# Patient Record
Sex: Male | Born: 1935 | Race: White | Hispanic: No | Marital: Married | Smoking: Former smoker
Health system: Southern US, Community
[De-identification: ages and names within clinical notes are randomized; demographics above are authoritative.]

## PROBLEM LIST (undated history)

## (undated) DIAGNOSIS — R55 Syncope and collapse: Secondary | ICD-10-CM

## (undated) DIAGNOSIS — I219 Acute myocardial infarction, unspecified: Secondary | ICD-10-CM

## (undated) DIAGNOSIS — F0390 Unspecified dementia without behavioral disturbance: Secondary | ICD-10-CM

## (undated) DIAGNOSIS — E785 Hyperlipidemia, unspecified: Secondary | ICD-10-CM

## (undated) DIAGNOSIS — I719 Aortic aneurysm of unspecified site, without rupture: Secondary | ICD-10-CM

## (undated) DIAGNOSIS — R778 Other specified abnormalities of plasma proteins: Secondary | ICD-10-CM

## (undated) DIAGNOSIS — C801 Malignant (primary) neoplasm, unspecified: Secondary | ICD-10-CM

## (undated) DIAGNOSIS — N189 Chronic kidney disease, unspecified: Secondary | ICD-10-CM

## (undated) DIAGNOSIS — I2510 Atherosclerotic heart disease of native coronary artery without angina pectoris: Secondary | ICD-10-CM

## (undated) DIAGNOSIS — R7989 Other specified abnormal findings of blood chemistry: Secondary | ICD-10-CM

## (undated) DIAGNOSIS — E119 Type 2 diabetes mellitus without complications: Secondary | ICD-10-CM

## (undated) HISTORY — DX: Atherosclerotic heart disease of native coronary artery without angina pectoris: I25.10

## (undated) HISTORY — DX: Chronic kidney disease, unspecified: N18.9

## (undated) HISTORY — PX: ABDOMINAL AORTIC ANEURYSM REPAIR: SUR1152

## (undated) HISTORY — DX: Type 2 diabetes mellitus without complications: E11.9

## (undated) HISTORY — DX: Aortic aneurysm of unspecified site, without rupture: I71.9

## (undated) HISTORY — DX: Other specified abnormal findings of blood chemistry: R79.89

## (undated) HISTORY — DX: Hyperlipidemia, unspecified: E78.5

## (undated) HISTORY — PX: WISDOM TOOTH EXTRACTION: SHX21

## (undated) HISTORY — DX: Unspecified dementia, unspecified severity, without behavioral disturbance, psychotic disturbance, mood disturbance, and anxiety: F03.90

## (undated) HISTORY — DX: Syncope and collapse: R55

## (undated) HISTORY — PX: HERNIA REPAIR: SHX51

## (undated) HISTORY — DX: Other specified abnormalities of plasma proteins: R77.8

## (undated) HISTORY — PX: APPENDECTOMY: SHX54

## (undated) HISTORY — PX: CATARACT EXTRACTION W/ INTRAOCULAR LENS IMPLANT: SHX1309

---

## 2019-06-25 ENCOUNTER — Encounter: Payer: Self-pay | Admitting: Medical

## 2019-09-03 ENCOUNTER — Encounter: Payer: Self-pay | Admitting: *Deleted

## 2019-09-03 ENCOUNTER — Ambulatory Visit (INDEPENDENT_AMBULATORY_CARE_PROVIDER_SITE_OTHER): Payer: Medicare Other | Admitting: Cardiology

## 2019-09-03 ENCOUNTER — Other Ambulatory Visit: Payer: Self-pay

## 2019-09-03 ENCOUNTER — Encounter: Payer: Self-pay | Admitting: Cardiology

## 2019-09-03 VITALS — BP 120/56 | HR 66 | Ht 71.5 in | Wt 196.0 lb

## 2019-09-03 DIAGNOSIS — I2510 Atherosclerotic heart disease of native coronary artery without angina pectoris: Secondary | ICD-10-CM | POA: Insufficient documentation

## 2019-09-03 DIAGNOSIS — E785 Hyperlipidemia, unspecified: Secondary | ICD-10-CM | POA: Diagnosis not present

## 2019-09-03 DIAGNOSIS — I10 Essential (primary) hypertension: Secondary | ICD-10-CM

## 2019-09-03 DIAGNOSIS — I714 Abdominal aortic aneurysm, without rupture, unspecified: Secondary | ICD-10-CM

## 2019-09-03 HISTORY — DX: Atherosclerotic heart disease of native coronary artery without angina pectoris: I25.10

## 2019-09-03 HISTORY — DX: Essential (primary) hypertension: I10

## 2019-09-03 HISTORY — DX: Abdominal aortic aneurysm, without rupture, unspecified: I71.40

## 2019-09-03 MED ORDER — CLOPIDOGREL BISULFATE 75 MG PO TABS: 75.0000 mg | ORAL_TABLET | Freq: Every day | ORAL | 1 refills | Status: DC

## 2019-09-03 MED ORDER — METOPROLOL SUCCINATE ER 25 MG PO TB24: 25.0000 mg | ORAL_TABLET | Freq: Every day | ORAL | 1 refills | Status: DC

## 2019-09-03 MED FILL — METOPROLOL SUCCINATE ER 25: 25 | 90 days supply | Qty: 90 | Fill #0

## 2019-09-03 MED FILL — CLOPIDOGREL 75 MG TABLET: 75 | 90 days supply | Qty: 90 | Fill #0

## 2019-09-03 NOTE — Patient Instructions (Signed)
Medication Instructions:  Your physician recommends that you continue on your current medications as directed. Please refer to the Current Medication list given to you today.  *If you need a refill on your cardiac medications before your next appointment, please call your pharmacy*  Lab Work: NOne If you have labs (blood work) drawn today and your tests are completely normal, you will receive your results only by: Marland Kitchen MyChart Message (if you have MyChart) OR . A paper copy in the mail If you have any lab test that is abnormal or we need to change your treatment, we will call you to review the results.  Testing/Procedures: Your physician has requested that you have an echocardiogram. Echocardiography is a painless test that uses sound waves to create images of your heart. It provides your doctor with information about the size and shape of your heart and how well your heart's chambers and valves are working. This procedure takes approximately one hour. There are no restrictions for this procedure.    Follow-Up: At [facility], you and your health needs are our priority.  As part of our continuing mission to provide you with exceptional heart care, we have created designated Provider Care Teams.  These Care Teams include your primary Cardiologist (physician) and Advanced Practice Providers (APPs -  Physician Assistants and Nurse Practitioners) who all work together to provide you with the care you need, when you need it.  Your next appointment:   3 months  The format for your next appointment:   In Person  Provider:   Berniece Salines, DO  Other Instructions

## 2019-09-03 NOTE — Progress Notes (Signed)
Cardiology Office Note:    Date:  09/03/2019   ID:  Ian Harrison, DOB 06/17/1927, MRN [id number]  PCP:  Patient, No Pcp Per  Cardiologist:  Berniece Salines, DO  Electrophysiologist:  None   Referring MD: No ref. provider found   The patient is here to establish care.  History of Present Illness:    Ian Harrison is a 85 y.o. male with a hx of CAD  With prior MI requiring RCA stent on 2005, NSTEMI in 02/2015 with another stent placement to his RCA, residual 50% Lcx disease, Aortic Anuerysm status post repair in 2007, Dyslipidemia and was taken off atorvastatin due to myalgias., Type II DM, CKD and Dementia.  The patient is here today to establish care. He offers no complaints at this time.   Past Medical History:  Diagnosis Date  . Aortic aneurysm ([place])   . CAD (coronary artery disease)   . Chronic kidney disease   . Dementia ([place])   . Diabetes mellitus type II, non insulin dependent ([place])   . Dyslipidemia    Intolerance to atorvastatin due to myalgias  . Vasovagal syncope    Without recurrence    Past Surgical History:  Procedure Laterality Date  . APPENDECTOMY    . HERNIA REPAIR      Current Medications: Current Meds  Medication Sig  . aspirin EC 81 MG tablet Take 81 mg by mouth daily.  . clopidogrel (PLAVIX) 75 MG tablet Take 1 tablet (75 mg total) by mouth daily.  Marland Kitchen donepezil (ARICEPT) 5 MG tablet Take 5 mg by mouth at bedtime.  . metoprolol succinate (TOPROL-XL) 25 MG 24 hr tablet Take 1 tablet (25 mg total) by mouth daily.  . [DISCONTINUED] clopidogrel (PLAVIX) 75 MG tablet Take 75 mg by mouth daily.  . [DISCONTINUED] metoprolol succinate (TOPROL-XL) 25 MG 24 hr tablet Take 25 mg by mouth daily.     Allergies:   Atorvastatin and Morphine and related   Social History   Socioeconomic History  . Marital status: Married    Spouse name: Not on file  . Number of children: Not on file  . Years of education: Not on file  . Highest education level: Not on file   Occupational History  . Not on file  Social Needs  . Financial resource strain: Not on file  . Food insecurity    Worry: Not on file    Inability: Not on file  . Transportation needs    Medical: Not on file    Non-medical: Not on file  Tobacco Use  . Smoking status: Former Research scientist (life sciences)  . Smokeless tobacco: Never Used  Substance and Sexual Activity  . Alcohol use: Not Currently  . Drug use: Not on file  . Sexual activity: Not on file  Lifestyle  . Physical activity    Days per week: Not on file    Minutes per session: Not on file  . Stress: Not on file  Relationships  . Social Herbalist on phone: Not on file    Gets together: Not on file    Attends religious service: Not on file    Active member of club or organization: Not on file    Attends meetings of clubs or organizations: Not on file    Relationship status: Not on file  Other Topics Concern  . Not on file  Social History Narrative  . Not on file     Family History: The patient's family history includes Heart attack  in his brother.  ROS:   Review of Systems  Constitution: Negative for decreased appetite, fever and weight gain.  HENT: Negative for congestion, ear discharge, hoarse voice and sore throat.   Eyes: Negative for discharge, redness, vision loss in right eye and visual halos.  Cardiovascular: Negative for chest pain, dyspnea on exertion, leg swelling, orthopnea and palpitations.  Respiratory: Negative for cough, hemoptysis, shortness of breath and snoring.   Endocrine: Negative for heat intolerance and polyphagia.  Hematologic/Lymphatic: Negative for bleeding problem. Does not bruise/bleed easily.  Skin: Negative for flushing, nail changes, rash and suspicious lesions.  Musculoskeletal: Negative for arthritis, joint pain, muscle cramps, myalgias, neck pain and stiffness.  Gastrointestinal: Negative for abdominal pain, bowel incontinence, diarrhea and excessive appetite.  Genitourinary: Negative  for decreased libido, genital sores and incomplete emptying.  Neurological: Negative for brief paralysis, focal weakness, headaches and loss of balance.  Psychiatric/Behavioral: Negative for altered mental status, depression and suicidal ideas.  Allergic/Immunologic: Negative for HIV exposure and persistent infections.    EKGs/Labs/Other Studies Reviewed:    The following studies were reviewed today:   EKG:  The ekg ordered today demonstrates sinus rhythm,   TTE 2013 shows EF 60%, Mild LVH at 1.2cm. No wall motion abnormalities. No significant valvular disease. Aortic and mitral valve sclerosis without stenosis.   Nuclear stress test 2012 previous cardiologist reported as normal.  Recent Labs: No results found for requested labs within last 8760 hours.  Recent Lipid Panel No results found for: CHOL, TRIG, HDL, CHOLHDL, VLDL, LDLCALC, LDLDIRECT  Physical Exam:    VS:  BP (!) 120/56 (BP Location: Right Arm, Patient Position: Sitting, Cuff Size: Normal)   Pulse 66   Ht 5' 11.5" (1.816 m)   Wt 196 lb (88.9 kg)   SpO2 96%   BMI 26.96 kg/m     Wt Readings from Last 3 Encounters:  09/03/19 196 lb (88.9 kg)     GEN: Well nourished, well developed in no acute distress HEENT: Normal NECK: No JVD; No carotid bruits LYMPHATICS: No lymphadenopathy CARDIAC: S1S2 noted,RRR, no murmurs, rubs, gallops RESPIRATORY:  Clear to auscultation without rales, wheezing or rhonchi  ABDOMEN: Soft, non-tender, non-distended, +bowel sounds, no guarding. EXTREMITIES: No edema, No cyanosis, no clubbing MUSCULOSKELETAL:  No edema; No deformity  SKIN: Warm and dry NEUROLOGIC:  Alert and oriented x 3, non-focal PSYCHIATRIC:  Normal affect, good insight  ASSESSMENT:    1. Coronary artery disease involving native coronary artery of native heart without angina pectoris   2. AAA (abdominal aortic aneurysm) without rupture ([place])   3. Dyslipidemia   4. Essential hypertension    PLAN:    1.The  patient  Has no complaints today. His goal for his visit today is to establish cardiac care. I was able to review past notes from his previous cardiologist. Today, I will order a TTE in part of his establishment of care, last TTE was in 2015. He have requested refills for Plavix 75mg  daily and Toprol XL 25 mg daily.   2. He recently have blood work with his pcp and the patient will bring this blood work to me.  3. No changes in his medication at this time.  The patient is in agreement with the above plan. The patient left the office in stable condition.  The patient will follow up in 3 months.    Medication Adjustments/Labs and Tests Ordered: Current medicines are reviewed at length with the patient today.  Concerns regarding medicines are outlined above.  Orders Placed This Encounter  Procedures  . EKG 12-Lead  . ECHOCARDIOGRAM COMPLETE   Meds ordered this encounter  Medications  . clopidogrel (PLAVIX) 75 MG tablet    Sig: Take 1 tablet (75 mg total) by mouth daily.    Dispense:  90 tablet    Refill:  1  . metoprolol succinate (TOPROL-XL) 25 MG 24 hr tablet    Sig: Take 1 tablet (25 mg total) by mouth daily.    Dispense:  90 tablet    Refill:  1    Patient Instructions  Medication Instructions:  Your physician recommends that you continue on your current medications as directed. Please refer to the Current Medication list given to you today.  *If you need a refill on your cardiac medications before your next appointment, please call your pharmacy*  Lab Work: NOne If you have labs (blood work) drawn today and your tests are completely normal, you will receive your results only by: Marland Kitchen MyChart Message (if you have MyChart) OR . A paper copy in the mail If you have any lab test that is abnormal or we need to change your treatment, we will call you to review the results.  Testing/Procedures: Your physician has requested that you have an echocardiogram. Echocardiography is a  painless test that uses sound waves to create images of your heart. It provides your doctor with information about the size and shape of your heart and how well your heart's chambers and valves are working. This procedure takes approximately one hour. There are no restrictions for this procedure.    Follow-Up: At [facility], you and your health needs are our priority.  As part of our continuing mission to provide you with exceptional heart care, we have created designated Provider Care Teams.  These Care Teams include your primary Cardiologist (physician) and Advanced Practice Providers (APPs -  Physician Assistants and Nurse Practitioners) who all work together to provide you with the care you need, when you need it.  Your next appointment:   3 months  The format for your next appointment:   In Person  Provider:   Berniece Salines, DO  Other Instructions      Adopting a Healthy Lifestyle.  Know what a healthy weight is for you (roughly BMI <25) and aim to maintain this   Aim for 7+ servings of fruits and vegetables daily   65-80+ fluid ounces of water or unsweet tea for healthy kidneys   Limit to max 1 drink of alcohol per day; avoid smoking/tobacco   Limit animal fats in diet for cholesterol and heart health - choose grass fed whenever available   Avoid highly processed foods, and foods high in saturated/trans fats   Aim for low stress - take time to unwind and care for your mental health   Aim for 150 min of moderate intensity exercise weekly for heart health, and weights twice weekly for bone health   Aim for 7-9 hours of sleep daily   When it comes to diets, agreement about the perfect plan isnt easy to find, even among the experts. Experts at the [employer] developed an idea known as the Healthy Eating Plate. Just imagine a plate divided into logical, healthy portions.   The emphasis is on diet quality:   Load up on vegetables and fruits -  one-half of your plate: Aim for color and variety, and remember that potatoes dont count.   Go for whole grains - one-quarter of your  plate: Whole wheat, barley, wheat berries, quinoa, oats, brown rice, and foods made with them. If you want pasta, go with whole wheat pasta.   Protein power - one-quarter of your plate: Fish, chicken, beans, and nuts are all healthy, versatile protein sources. Limit red meat.   The diet, however, does go beyond the plate, offering a few other suggestions.   Use healthy plant oils, such as olive, canola, soy, corn, sunflower and peanut. Check the labels, and avoid partially hydrogenated oil, which have unhealthy trans fats.   If youre thirsty, drink water. Coffee and tea are good in moderation, but skip sugary drinks and limit milk and dairy products to one or two daily servings.   The type of carbohydrate in the diet is more important than the amount. Some sources of carbohydrates, such as vegetables, fruits, whole grains, and beans-are healthier than others.   Finally, stay active  Signed, Berniece Salines, DO  09/03/2019 4:04 PM    [facility]

## 2019-09-06 ENCOUNTER — Other Ambulatory Visit: Payer: Self-pay

## 2019-09-06 ENCOUNTER — Ambulatory Visit (HOSPITAL_BASED_OUTPATIENT_CLINIC_OR_DEPARTMENT_OTHER)
Admission: RE | Admit: 2019-09-06 | Discharge: 2019-09-06 | Disposition: A | Discharge status: Home / Self Care | Payer: Medicare Other | Source: Ambulatory Visit | Attending: Cardiology | Admitting: Cardiology

## 2019-09-06 DIAGNOSIS — I10 Essential (primary) hypertension: Secondary | ICD-10-CM | POA: Diagnosis present

## 2019-09-06 DIAGNOSIS — I2510 Atherosclerotic heart disease of native coronary artery without angina pectoris: Secondary | ICD-10-CM | POA: Diagnosis present

## 2019-09-06 NOTE — Progress Notes (Signed)
  Echocardiogram 2D Echocardiogram has been performed.  Ian Harrison 09/06/2019, 2:57 PM

## 2019-09-24 ENCOUNTER — Encounter: Payer: Self-pay | Admitting: Medical

## 2019-09-24 ENCOUNTER — Ambulatory Visit (INDEPENDENT_AMBULATORY_CARE_PROVIDER_SITE_OTHER): Payer: Medicare Other | Admitting: Medical

## 2019-09-24 ENCOUNTER — Other Ambulatory Visit: Payer: Self-pay

## 2019-09-24 VITALS — BP 131/61 | HR 63 | Temp 96.5°F | Resp 16 | Ht 71.2 in | Wt 193.2 lb

## 2019-09-24 DIAGNOSIS — E785 Hyperlipidemia, unspecified: Secondary | ICD-10-CM | POA: Diagnosis not present

## 2019-09-24 DIAGNOSIS — I2510 Atherosclerotic heart disease of native coronary artery without angina pectoris: Secondary | ICD-10-CM | POA: Diagnosis not present

## 2019-09-24 DIAGNOSIS — I714 Abdominal aortic aneurysm, without rupture, unspecified: Secondary | ICD-10-CM

## 2019-09-24 DIAGNOSIS — I10 Essential (primary) hypertension: Secondary | ICD-10-CM | POA: Diagnosis not present

## 2019-09-24 DIAGNOSIS — F0390 Unspecified dementia without behavioral disturbance: Secondary | ICD-10-CM

## 2019-09-24 HISTORY — DX: Unspecified dementia, unspecified severity, without behavioral disturbance, psychotic disturbance, mood disturbance, and anxiety: F03.90

## 2019-09-24 MED ORDER — DONEPEZIL HCL 5 MG PO TABS: 5.0000 mg | ORAL_TABLET | Freq: Every day | ORAL | 3 refills | Status: DC

## 2019-09-24 MED FILL — DONEPEZIL HCL 5 MG TABLET: 5 | 90 days supply | Qty: 90 | Fill #0

## 2019-09-24 NOTE — Patient Instructions (Addendum)
For cognitive decline/dementia, continue aricept. If you want me to refer to neurologist in the future then let me know.  For CAD, continue aspirin and plavix. Might consider zetia in future if needed but at this point with age and hx probably not necessary  For htn continue with metoprolol.   For elevated sugar borderline will get a1c in 3 months or as needed  Follow up 3 months fasting labs/check up or as needed

## 2019-09-24 NOTE — Progress Notes (Signed)
Subjective:    Patient ID: Ian Harrison, male    DOB: August 04, 1927, 85 y.o.   MRN: [id number]  HPI  Moved from [place] with wife. Family in area/  Benign htn hx. Bp controlled today.  Hx of bph.   Dementia history. Has been on maybe aricept for  1.5 years.   Type II diabetes hx.A1c 8-31/2020 6.5.  Hyperlipidemia.(hx of stents) 06/25/2019 lipid panel looked good.     Review of Systems  Constitutional: Negative for chills, fatigue and fever.  HENT: Negative for congestion and ear pain.   Respiratory: Negative for cough, chest tightness, shortness of breath and wheezing.   Cardiovascular: Negative for chest pain and palpitations.  Gastrointestinal: Negative for abdominal pain.  Musculoskeletal: Negative for back pain.  Skin: Negative for rash.  Neurological: Negative for dizziness, seizures, syncope, weakness and light-headedness.  Hematological: Negative for adenopathy. Does not bruise/bleed easily.  Psychiatric/Behavioral: Negative for behavioral problems, confusion, sleep disturbance and suicidal ideas. The patient is not nervous/anxious.      Past Medical History:  Diagnosis Date  . Aortic aneurysm ([place])   . CAD (coronary artery disease)   . Chronic kidney disease   . Dementia ([place])   . Diabetes mellitus type II, non insulin dependent ([place])   . Dyslipidemia    Intolerance to atorvastatin due to myalgias  . Vasovagal syncope    Without recurrence     Social History   Socioeconomic History  . Marital status: Married    Spouse name: Not on file  . Number of children: Not on file  . Years of education: Not on file  . Highest education level: Not on file  Occupational History  . Not on file  Social Needs  . Financial resource strain: Not on file  . Food insecurity    Worry: Not on file    Inability: Not on file  . Transportation needs    Medical: Not on file    Non-medical: Not on file  Tobacco Use  . Smoking status: Former Research scientist (life sciences)  . Smokeless  tobacco: Never Used  Substance and Sexual Activity  . Alcohol use: Not Currently  . Drug use: Not on file  . Sexual activity: Not on file  Lifestyle  . Physical activity    Days per week: Not on file    Minutes per session: Not on file  . Stress: Not on file  Relationships  . Social Herbalist on phone: Not on file    Gets together: Not on file    Attends religious service: Not on file    Active member of club or organization: Not on file    Attends meetings of clubs or organizations: Not on file    Relationship status: Not on file  . Intimate partner violence    Fear of current or ex partner: Not on file    Emotionally abused: Not on file    Physically abused: Not on file    Forced sexual activity: Not on file  Other Topics Concern  . Not on file  Social History Narrative  . Not on file    Past Surgical History:  Procedure Laterality Date  . APPENDECTOMY    . HERNIA REPAIR      Family History  Problem Relation Age of Onset  . Heart attack Brother     Allergies  Allergen Reactions  . Atorvastatin Other (See Comments)    myalgia  . Morphine And Related     Current  Outpatient Medications on File Prior to Visit  Medication Sig Dispense Refill  . aspirin EC 81 MG tablet Take 81 mg by mouth daily.    . clopidogrel (PLAVIX) 75 MG tablet Take 1 tablet (75 mg total) by mouth daily. 90 tablet 1  . metoprolol succinate (TOPROL-XL) 25 MG 24 hr tablet Take 1 tablet (25 mg total) by mouth daily. 90 tablet 1   No current facility-administered medications on file prior to visit.     BP 131/61   Pulse 63   Temp (!) 96.5 F (35.8 C) (Temporal)   Resp 16   Ht 5' 11.2" (1.808 m)   Wt 193 lb 3.2 oz (87.6 kg)   SpO2 99%   BMI 26.79 kg/m      Objective:   Physical Exam  General Mental Status- Alert. General Appearance- Not in acute distress.  Pleasant and very friendly. He directs wife to answer a lot of questions I ask him.  Skin General: Color- Normal  Color. Moisture- Normal Moisture.  Neck Carotid Arteries- Normal color. Moisture- Normal Moisture. No carotid bruits. No JVD.  Chest and Lung Exam Auscultation: Breath Sounds:-Normal.  Cardiovascular Auscultation:Rythm- Regular. Murmurs & Other Heart Sounds:Auscultation of the heart reveals- No Murmurs.  Abdomen Inspection:-Inspeection Normal. Palpation/Percussion:Note:No mass. Palpation and Percussion of the abdomen reveal- Non Tender, Non Distended + BS, no rebound or guarding.    Neurologic Cranial Nerve exam:- CN III-XII intact(No nystagmus), symmetric smile. Strength:- 5/5 equal and symmetric strength both upper and lower extremities.      Assessment & Plan:  For cognitive decline/dementia, continue aricept. If you want me to refer to neurologist in the future then let me know.  For CAD, continue aspirin and plavix. Might consider zetia in future if needed but at this point with age and hx probably not necessary  For htn continue with metoprolol.   For elevated sugar borderline will get a1c in 3 months or as needed  [employer], [employer]

## 2019-11-26 ENCOUNTER — Ambulatory Visit (INDEPENDENT_AMBULATORY_CARE_PROVIDER_SITE_OTHER): Payer: Medicare Other | Admitting: Cardiology

## 2019-11-26 ENCOUNTER — Other Ambulatory Visit: Payer: Self-pay

## 2019-11-26 ENCOUNTER — Encounter: Payer: Self-pay | Admitting: Cardiology

## 2019-11-26 VITALS — BP 130/64 | HR 62 | Ht 71.2 in | Wt 188.0 lb

## 2019-11-26 DIAGNOSIS — I10 Essential (primary) hypertension: Secondary | ICD-10-CM | POA: Diagnosis not present

## 2019-11-26 DIAGNOSIS — I2510 Atherosclerotic heart disease of native coronary artery without angina pectoris: Secondary | ICD-10-CM | POA: Diagnosis not present

## 2019-11-26 DIAGNOSIS — E785 Hyperlipidemia, unspecified: Secondary | ICD-10-CM

## 2019-11-26 NOTE — Progress Notes (Signed)
Cardiology Office Note:    Date:  11/26/2019   ID:  Ian Harrison, DOB 12-09-1926, MRN [id number]  PCP:  Ian Pai, PA-C  Cardiologist:  Ian Salines, DO  Electrophysiologist:  None   Referring MD: No ref. provider found   Chief Complaint  Patient presents with  . Follow-up   History of Present Illness:    Ian Harrison is a 86 y.o. male with a hx of CAD  With prior MI requiring RCA stent on 2005, NSTEMI in 02/2015 with another stent placement to his RCA, residual 50% Lcx disease, Aortic Anuerysm status post repair in 2007, Dyslipidemia and was taken off atorvastatin due to myalgias., Type II DM, CKD and Dementia.  I did see the patient on September 03, 2019 he was here to establish cardiac care.  At that time I was able to review the previous notes from his prior cardiologist.  And order echocardiogram to assess LV function in part of stopping his cardiac care.  No medication changes were made at this time.  He is here for follow-up visit he offers no complaints at this time.   Past Medical History:  Diagnosis Date  . Aortic aneurysm ([place])   . CAD (coronary artery disease)   . Chronic kidney disease   . Dementia ([place])   . Diabetes mellitus type II, non insulin dependent ([place])   . Dyslipidemia    Intolerance to atorvastatin due to myalgias  . Vasovagal syncope    Without recurrence    Past Surgical History:  Procedure Laterality Date  . APPENDECTOMY    . HERNIA REPAIR      Current Medications: Current Meds  Medication Sig  . aspirin EC 81 MG tablet Take 81 mg by mouth daily.  . clopidogrel (PLAVIX) 75 MG tablet Take 1 tablet (75 mg total) by mouth daily.  Marland Kitchen donepezil (ARICEPT) 5 MG tablet Take 1 tablet (5 mg total) by mouth at bedtime.  . metoprolol succinate (TOPROL-XL) 25 MG 24 hr tablet Take 1 tablet (25 mg total) by mouth daily.     Allergies:   Atorvastatin and Morphine and related   Social History   Socioeconomic History  . Marital status: Married     Spouse name: Not on file  . Number of children: Not on file  . Years of education: Not on file  . Highest education level: Not on file  Occupational History  . Not on file  Tobacco Use  . Smoking status: Former Research scientist (life sciences)  . Smokeless tobacco: Never Used  Substance and Sexual Activity  . Alcohol use: Not Currently  . Drug use: Not on file  . Sexual activity: Not on file  Other Topics Concern  . Not on file  Social History Narrative  . Not on file   Social Determinants of Health   Financial Resource Strain:   . Difficulty of Paying Living Expenses: Not on file  Food Insecurity:   . Worried About Charity fundraiser in the Last Year: Not on file  . Ran Out of Food in the Last Year: Not on file  Transportation Needs:   . Lack of Transportation (Medical): Not on file  . Lack of Transportation (Non-Medical): Not on file  Physical Activity:   . Days of Exercise per Week: Not on file  . Minutes of Exercise per Session: Not on file  Stress:   . Feeling of Stress : Not on file  Social Connections:   . Frequency of Communication with Friends and Family:  Not on file  . Frequency of Social Gatherings with Friends and Family: Not on file  . Attends Religious Services: Not on file  . Active Member of Clubs or Organizations: Not on file  . Attends Archivist Meetings: Not on file  . Marital Status: Not on file     Family History: The patient's family history includes Heart attack in his brother.  ROS:   Review of Systems  Constitution: Negative for decreased appetite, fever and weight gain.  HENT: Negative for congestion, ear discharge, hoarse voice and sore throat.   Eyes: Negative for discharge, redness, vision loss in right eye and visual halos.  Cardiovascular: Negative for chest pain, dyspnea on exertion, leg swelling, orthopnea and palpitations.  Respiratory: Negative for cough, hemoptysis, shortness of breath and snoring.   Endocrine: Negative for heat  intolerance and polyphagia.  Hematologic/Lymphatic: Negative for bleeding problem. Does not bruise/bleed easily.  Skin: Negative for flushing, nail changes, rash and suspicious lesions.  Musculoskeletal: Negative for arthritis, joint pain, muscle cramps, myalgias, neck pain and stiffness.  Gastrointestinal: Negative for abdominal pain, bowel incontinence, diarrhea and excessive appetite.  Genitourinary: Negative for decreased libido, genital sores and incomplete emptying.  Neurological: Negative for brief paralysis, focal weakness, headaches and loss of balance.  Psychiatric/Behavioral: Negative for altered mental status, depression and suicidal ideas.  Allergic/Immunologic: Negative for HIV exposure and persistent infections.    EKGs/Labs/Other Studies Reviewed:    The following studies were reviewed today:   EKG: None today  Transthoracic echocardiogram IMPRESSIONS 09/06/2019 1. Left ventricular ejection fraction, by visual estimation, is 60 to  65%. The left ventricle has normal function. Left ventricular septal wall  thickness was mildly increased. Mildly increased left ventricular  posterior wall thickness. There is mildly  increased left ventricular hypertrophy.  2. Left ventricular diastolic parameters are consistent with Grade I  diastolic dysfunction (impaired relaxation).  3. Global right ventricle has normal systolic function.The right  ventricular size is normal. No increase in right ventricular wall  thickness.  4. Left atrial size was normal.  5. Right atrial size was normal.  6. The mitral valve is normal in structure. No evidence of mitral valve  regurgitation. No evidence of mitral stenosis.  7. The tricuspid valve is normal in structure. Tricuspid valve  regurgitation is not demonstrated.  8. The aortic valve is normal in structure. Aortic valve regurgitation is  trivial. Mild aortic valve sclerosis without stenosis.  9. The pulmonic valve was normal in  structure. Pulmonic valve  regurgitation is not visualized.  10. Normal pulmonary artery systolic pressure.   Recent Labs: No results found for requested labs within last 8760 hours.  Recent Lipid Panel No results found for: CHOL, TRIG, HDL, CHOLHDL, VLDL, LDLCALC, LDLDIRECT  Physical Exam:    VS:  BP 130/64 (BP Location: Right Arm, Patient Position: Sitting, Cuff Size: Normal)   Pulse 62   Ht 5' 11.2" (1.808 m)   Wt 188 lb (85.3 kg)   SpO2 99%   BMI 26.07 kg/m     Wt Readings from Last 3 Encounters:  11/26/19 188 lb (85.3 kg)  09/24/19 193 lb 3.2 oz (87.6 kg)  09/03/19 196 lb (88.9 kg)     GEN: Well nourished, well developed in no acute distress HEENT: Normal NECK: No JVD; No carotid bruits LYMPHATICS: No lymphadenopathy CARDIAC: S1S2 noted,RRR, no murmurs, rubs, gallops RESPIRATORY:  Clear to auscultation without rales, wheezing or rhonchi  ABDOMEN: Soft, non-tender, non-distended, +bowel sounds, no guarding. EXTREMITIES: No  edema, No cyanosis, no clubbing MUSCULOSKELETAL:  No deformity  SKIN: Warm and dry NEUROLOGIC:  Alert and oriented x 3, non-focal PSYCHIATRIC:  Normal affect, good insight  ASSESSMENT:    1. Coronary artery disease involving native coronary artery of native heart without angina pectoris   2. Dyslipidemia   3. Essential hypertension    PLAN:     1.  Clinically patient appears to be doing well from a cardiovascular standpoint.  I was able to review again his echocardiogram with the patient and his wife.  He appears to be doing well from a cardiovascular standpoint.  2.  No changes in medication today.  The patient is in agreement with the above plan. The patient left the office in stable condition.  The patient will follow up in 6 months or sooner as needed.   Medication Adjustments/Labs and Tests Ordered: Current medicines are reviewed at length with the patient today.  Concerns regarding medicines are outlined above.  No orders of the  defined types were placed in this encounter.  No orders of the defined types were placed in this encounter.   There are no Patient Instructions on file for this visit.   Adopting a Healthy Lifestyle.  Know what a healthy weight is for you (roughly BMI <25) and aim to maintain this   Aim for 7+ servings of fruits and vegetables daily   65-80+ fluid ounces of water or unsweet tea for healthy kidneys   Limit to max 1 drink of alcohol per day; avoid smoking/tobacco   Limit animal fats in diet for cholesterol and heart health - choose grass fed whenever available   Avoid highly processed foods, and foods high in saturated/trans fats   Aim for low stress - take time to unwind and care for your mental health   Aim for 150 min of moderate intensity exercise weekly for heart health, and weights twice weekly for bone health   Aim for 7-9 hours of sleep daily   When it comes to diets, agreement about the perfect plan isnt easy to find, even among the experts. Experts at the [place] developed an idea known as the Healthy Eating Plate. Just imagine a plate divided into logical, healthy portions.   The emphasis is on diet quality:   Load up on vegetables and fruits - one-half of your plate: Aim for color and variety, and remember that potatoes dont count.   Go for whole grains - one-quarter of your plate: Whole wheat, barley, wheat berries, quinoa, oats, brown rice, and foods made with them. If you want pasta, go with whole wheat pasta.   Protein power - one-quarter of your plate: Fish, chicken, beans, and nuts are all healthy, versatile protein sources. Limit red meat.   The diet, however, does go beyond the plate, offering a few other suggestions.   Use healthy plant oils, such as olive, canola, soy, corn, sunflower and peanut. Check the labels, and avoid partially hydrogenated oil, which have unhealthy trans fats.   If youre thirsty, drink water. Coffee and  tea are good in moderation, but skip sugary drinks and limit milk and dairy products to one or two daily servings.   The type of carbohydrate in the diet is more important than the amount. Some sources of carbohydrates, such as vegetables, fruits, whole grains, and beans-are healthier than others.   Finally, stay active  Signed, Ian Salines, DO  11/26/2019 2:36 PM    [facility]

## 2019-11-26 NOTE — Patient Instructions (Signed)
Medication Instructions:  Your physician recommends that you continue on your current medications as directed. Please refer to the Current Medication list given to you today.  *If you need a refill on your cardiac medications before your next appointment, please call your pharmacy*  Lab Work: NONE If you have labs (blood work) drawn today and your tests are completely normal, you will receive your results only by: Marland Kitchen MyChart Message (if you have MyChart) OR . A paper copy in the mail If you have any lab test that is abnormal or we need to change your treatment, we will call you to review the results.  Testing/Procedures: NONE  Follow-Up: At [facility], you and your health needs are our priority.  As part of our continuing mission to provide you with exceptional heart care, we have created designated Provider Care Teams.  These Care Teams include your primary Cardiologist (physician) and Advanced Practice Providers (APPs -  Physician Assistants and Nurse Practitioners) who all work together to provide you with the care you need, when you need it.  Your next appointment: 6 months in [place] with Dr. Harriet Masson [facility] schedule

## 2019-12-12 MED FILL — CLOPIDOGREL 75 MG TABLET: 75 | 90 days supply | Qty: 90 | Fill #1

## 2019-12-12 MED FILL — METOPROLOL SUCCINATE ER 25: 25 | 90 days supply | Qty: 90 | Fill #1

## 2020-03-05 ENCOUNTER — Other Ambulatory Visit: Payer: Self-pay | Admitting: Cardiology

## 2020-03-05 MED ORDER — CLOPIDOGREL BISULFATE 75 MG PO TABS: 75.0000 mg | ORAL_TABLET | Freq: Every day | ORAL | 1 refills | Status: DC

## 2020-03-05 MED ORDER — METOPROLOL SUCCINATE ER 25 MG PO TB24: 25.0000 mg | ORAL_TABLET | Freq: Every day | ORAL | 1 refills | Status: DC

## 2020-03-05 NOTE — Telephone Encounter (Signed)
Refills sent in per request 

## 2020-03-05 NOTE — Telephone Encounter (Signed)
New Message   *STAT* If patient is at the pharmacy, call can be transferred to refill team.   1. Which medications need to be refilled? (please list name of each medication and dose if known) clopidogrel (PLAVIX) 75 MG tablet metoprolol succinate (TOPROL-XL) 25 MG 24 hr tablet  2. Which pharmacy/location (including street and city if local pharmacy) is medication to be sent to? [place], [place] - [place]  3. Do they need a 30 day or 90 day supply? 90 day supply

## 2020-05-14 ENCOUNTER — Other Ambulatory Visit: Payer: Self-pay

## 2020-05-14 ENCOUNTER — Encounter: Payer: Self-pay | Admitting: Cardiology

## 2020-05-14 ENCOUNTER — Ambulatory Visit: Payer: Medicare Other | Admitting: Cardiology

## 2020-05-14 VITALS — BP 120/64 | HR 64 | Ht 71.2 in | Wt 183.0 lb

## 2020-05-14 DIAGNOSIS — I2510 Atherosclerotic heart disease of native coronary artery without angina pectoris: Secondary | ICD-10-CM | POA: Diagnosis not present

## 2020-05-14 DIAGNOSIS — I10 Essential (primary) hypertension: Secondary | ICD-10-CM

## 2020-05-14 DIAGNOSIS — E785 Hyperlipidemia, unspecified: Secondary | ICD-10-CM

## 2020-05-14 NOTE — Patient Instructions (Signed)

## 2020-05-14 NOTE — Progress Notes (Signed)
Cardiology Office Note:    Date:  05/14/2020   ID:  Ian Harrison, DOB 22-Mar-1927, MRN [id number]  PCP:  Mackie Pai, PA-C  Cardiologist:  Berniece Salines, DO  Electrophysiologist:  None   Referring MD: Elise Benne   Chief Complaint  Patient presents with  . Follow-up   History of Present Illness:    Ian Harrison a 86 y.o.malewith a hx of CAD With prior MI requiring RCA stent on 2005, NSTEMI in 02/2015 with another stent placement to his RCA, residual 50% Lcx disease, Aortic Anuerysm status post repair in 2007, Dyslipidemia and was taken off atorvastatin due to myalgias., Type II DM, CKD and Dementia.  I last saw the patient in February 2021 at that time he appeared to be doing well from a cardiovascular standpoint.  He was seen with his wife.  Today he tells me that nothing has changed.  He denies any chest pain, shortness of breath, nausea, vomiting.  Past Medical History:  Diagnosis Date  . Aortic aneurysm ([place])   . CAD (coronary artery disease)   . Chronic kidney disease   . Dementia ([place])   . Diabetes mellitus type II, non insulin dependent ([place])   . Dyslipidemia    Intolerance to atorvastatin due to myalgias  . Vasovagal syncope    Without recurrence    Past Surgical History:  Procedure Laterality Date  . APPENDECTOMY    . HERNIA REPAIR      Current Medications: Current Meds  Medication Sig  . aspirin EC 81 MG tablet Take 81 mg by mouth daily.  . clopidogrel (PLAVIX) 75 MG tablet Take 1 tablet (75 mg total) by mouth daily.  . metoprolol succinate (TOPROL-XL) 25 MG 24 hr tablet Take 1 tablet (25 mg total) by mouth daily.     Allergies:   Atorvastatin and Morphine and related   Social History   Socioeconomic History  . Marital status: Married    Spouse name: Not on file  . Number of children: Not on file  . Years of education: Not on file  . Highest education level: Not on file  Occupational History  . Not on file  Tobacco Use  .  Smoking status: Former Research scientist (life sciences)  . Smokeless tobacco: Never Used  Substance and Sexual Activity  . Alcohol use: Not Currently  . Drug use: Not on file  . Sexual activity: Not on file  Other Topics Concern  . Not on file  Social History Narrative  . Not on file   Social Determinants of Health   Financial Resource Strain:   . Difficulty of Paying Living Expenses:   Food Insecurity:   . Worried About Charity fundraiser in the Last Year:   . Arboriculturist in the Last Year:   Transportation Needs:   . Film/video editor (Medical):   Marland Kitchen Lack of Transportation (Non-Medical):   Physical Activity:   . Days of Exercise per Week:   . Minutes of Exercise per Session:   Stress:   . Feeling of Stress :   Social Connections:   . Frequency of Communication with Friends and Family:   . Frequency of Social Gatherings with Friends and Family:   . Attends Religious Services:   . Active Member of Clubs or Organizations:   . Attends Archivist Meetings:   Marland Kitchen Marital Status:      Family History: The patient's family history includes Heart attack in his brother.  ROS:   Review  of Systems  Constitution: Negative for decreased appetite, fever and weight gain.  HENT: Negative for congestion, ear discharge, hoarse voice and sore throat.   Eyes: Negative for discharge, redness, vision loss in right eye and visual halos.  Cardiovascular: Negative for chest pain, dyspnea on exertion, leg swelling, orthopnea and palpitations.  Respiratory: Negative for cough, hemoptysis, shortness of breath and snoring.   Endocrine: Negative for heat intolerance and polyphagia.  Hematologic/Lymphatic: Negative for bleeding problem. Does not bruise/bleed easily.  Skin: Negative for flushing, nail changes, rash and suspicious lesions.  Musculoskeletal: Negative for arthritis, joint pain, muscle cramps, myalgias, neck pain and stiffness.  Gastrointestinal: Negative for abdominal pain, bowel incontinence,  diarrhea and excessive appetite.  Genitourinary: Negative for decreased libido, genital sores and incomplete emptying.  Neurological: Negative for brief paralysis, focal weakness, headaches and loss of balance.  Psychiatric/Behavioral: Negative for altered mental status, depression and suicidal ideas.  Allergic/Immunologic: Negative for HIV exposure and persistent infections.    EKGs/Labs/Other Studies Reviewed:    The following studies were reviewed today:   EKG:  The ekg ordered today demonstrates   Recent Labs: No results found for requested labs within last 8760 hours.  Recent Lipid Panel No results found for: CHOL, TRIG, HDL, CHOLHDL, VLDL, LDLCALC, LDLDIRECT  Physical Exam:    VS:  BP 120/64 (BP Location: Right Arm, Patient Position: Sitting, Cuff Size: Normal)   Pulse 64   Ht 5' 11.2" (1.808 m)   Wt 183 lb (83 kg)   SpO2 96%   BMI 25.38 kg/m     Wt Readings from Last 3 Encounters:  05/14/20 183 lb (83 kg)  11/26/19 188 lb (85.3 kg)  09/24/19 193 lb 3.2 oz (87.6 kg)     GEN: Well nourished, well developed in no acute distress HEENT: Normal NECK: No JVD; No carotid bruits LYMPHATICS: No lymphadenopathy CARDIAC: S1S2 noted,RRR, no murmurs, rubs, gallops RESPIRATORY:  Clear to auscultation without rales, wheezing or rhonchi  ABDOMEN: Soft, non-tender, non-distended, +bowel sounds, no guarding. EXTREMITIES: No edema, No cyanosis, no clubbing MUSCULOSKELETAL:  No deformity  SKIN: Warm and dry NEUROLOGIC:  Alert and oriented x 3, non-focal PSYCHIATRIC:  Normal affect, good insight  ASSESSMENT:    1. Coronary artery disease involving native coronary artery of native heart without angina pectoris   2. Essential hypertension   3. Dyslipidemia    PLAN:     He appears to be doing well from a cardiovascular standpoint.  He does not have any complaints today.  He is here with his wife who also concurred and said that the patient is doing well and exercising at home.   No changes will be made to his antihypertensive regimen.  The patient is in agreement with the above plan. The patient left the office in stable condition.  The patient will follow up in 6 months or sooner if needed.   Medication Adjustments/Labs and Tests Ordered: Current medicines are reviewed at length with the patient today.  Concerns regarding medicines are outlined above.  No orders of the defined types were placed in this encounter.  No orders of the defined types were placed in this encounter.   Patient Instructions  Medication Instructions:  No medication changes. *If you need a refill on your cardiac medications before your next appointment, please call your pharmacy*   Lab Work: None ordered If you have labs (blood work) drawn today and your tests are completely normal, you will receive your results only by: Marland Kitchen MyChart Message (if  you have MyChart) OR . A paper copy in the mail If you have any lab test that is abnormal or we need to change your treatment, we will call you to review the results.   Testing/Procedures: None ordered   Follow-Up: At [facility], you and your health needs are our priority.  As part of our continuing mission to provide you with exceptional heart care, we have created designated Provider Care Teams.  These Care Teams include your primary Cardiologist (physician) and Advanced Practice Providers (APPs -  Physician Assistants and Nurse Practitioners) who all work together to provide you with the care you need, when you need it.  We recommend signing up for the patient portal called "MyChart".  Sign up information is provided on this After Visit Summary.  MyChart is used to connect with patients for Virtual Visits (Telemedicine).  Patients are able to view lab/test results, encounter notes, upcoming appointments, etc.  Non-urgent messages can be sent to your provider as well.   To learn more about what you can do with MyChart, go to  [email or web address].    Your next appointment:   6 month(s)  The format for your next appointment:   In Person  Provider:   Berniece Salines, DO   Other Instructions NA     Adopting a Healthy Lifestyle.  Know what a healthy weight is for you (roughly BMI <25) and aim to maintain this   Aim for 7+ servings of fruits and vegetables daily   65-80+ fluid ounces of water or unsweet tea for healthy kidneys   Limit to max 1 drink of alcohol per day; avoid smoking/tobacco   Limit animal fats in diet for cholesterol and heart health - choose grass fed whenever available   Avoid highly processed foods, and foods high in saturated/trans fats   Aim for low stress - take time to unwind and care for your mental health   Aim for 150 min of moderate intensity exercise weekly for heart health, and weights twice weekly for bone health   Aim for 7-9 hours of sleep daily   When it comes to diets, agreement about the perfect plan isnt easy to find, even among the experts. Experts at the [place] developed an idea known as the Healthy Eating Plate. Just imagine a plate divided into logical, healthy portions.   The emphasis is on diet quality:   Load up on vegetables and fruits - one-half of your plate: Aim for color and variety, and remember that potatoes dont count.   Go for whole grains - one-quarter of your plate: Whole wheat, barley, wheat berries, quinoa, oats, brown rice, and foods made with them. If you want pasta, go with whole wheat pasta.   Protein power - one-quarter of your plate: Fish, chicken, beans, and nuts are all healthy, versatile protein sources. Limit red meat.   The diet, however, does go beyond the plate, offering a few other suggestions.   Use healthy plant oils, such as olive, canola, soy, corn, sunflower and peanut. Check the labels, and avoid partially hydrogenated oil, which have unhealthy trans fats.   If youre thirsty, drink water.  Coffee and tea are good in moderation, but skip sugary drinks and limit milk and dairy products to one or two daily servings.   The type of carbohydrate in the diet is more important than the amount. Some sources of carbohydrates, such as vegetables, fruits, whole grains, and beans-are healthier than others.   Finally,  stay active  Signed, Berniece Salines, DO  05/14/2020 1:43 PM    [facility]

## 2020-06-05 MED FILL — METOPROLOL SUCCINATE ER 25: 25 | 90 days supply | Qty: 90 | Fill #1

## 2020-06-05 MED FILL — CLOPIDOGREL 75 MG TABLET: 75 | 90 days supply | Qty: 90 | Fill #1

## 2020-06-09 ENCOUNTER — Emergency Department (HOSPITAL_BASED_OUTPATIENT_CLINIC_OR_DEPARTMENT_OTHER): Payer: Medicare Other

## 2020-06-09 ENCOUNTER — Other Ambulatory Visit: Payer: Self-pay

## 2020-06-09 ENCOUNTER — Encounter (HOSPITAL_BASED_OUTPATIENT_CLINIC_OR_DEPARTMENT_OTHER): Payer: Self-pay

## 2020-06-09 ENCOUNTER — Emergency Department (HOSPITAL_BASED_OUTPATIENT_CLINIC_OR_DEPARTMENT_OTHER)
Admission: EM | Admit: 2020-06-09 | Discharge: 2020-06-09 | Disposition: A | Discharge status: Home / Self Care | Payer: Medicare Other | Attending: Emergency Medicine | Admitting: Emergency Medicine

## 2020-06-09 DIAGNOSIS — Z87891 Personal history of nicotine dependence: Secondary | ICD-10-CM | POA: Insufficient documentation

## 2020-06-09 DIAGNOSIS — F0390 Unspecified dementia without behavioral disturbance: Secondary | ICD-10-CM | POA: Diagnosis not present

## 2020-06-09 DIAGNOSIS — R310 Gross hematuria: Secondary | ICD-10-CM | POA: Diagnosis not present

## 2020-06-09 DIAGNOSIS — Z7982 Long term (current) use of aspirin: Secondary | ICD-10-CM | POA: Insufficient documentation

## 2020-06-09 DIAGNOSIS — I10 Essential (primary) hypertension: Secondary | ICD-10-CM | POA: Diagnosis not present

## 2020-06-09 DIAGNOSIS — Z79899 Other long term (current) drug therapy: Secondary | ICD-10-CM | POA: Diagnosis not present

## 2020-06-09 DIAGNOSIS — E119 Type 2 diabetes mellitus without complications: Secondary | ICD-10-CM | POA: Insufficient documentation

## 2020-06-09 DIAGNOSIS — I2510 Atherosclerotic heart disease of native coronary artery without angina pectoris: Secondary | ICD-10-CM | POA: Diagnosis not present

## 2020-06-09 LAB — CBC
HCT: 37.7 % — ABNORMAL LOW (ref 39.0–52.0)
Hemoglobin: 12.3 g/dL — ABNORMAL LOW (ref 13.0–17.0)
MCH: 33.2 pg (ref 26.0–34.0)
MCHC: 32.6 g/dL (ref 30.0–36.0)
MCV: 101.6 fL — ABNORMAL HIGH (ref 80.0–100.0)
Platelets: 135 10*3/uL — ABNORMAL LOW (ref 150–400)
RBC: 3.71 MIL/uL — ABNORMAL LOW (ref 4.22–5.81)
RDW: 13.8 % (ref 11.5–15.5)
WBC: 6.3 10*3/uL (ref 4.0–10.5)
nRBC: 0 % (ref 0.0–0.2)

## 2020-06-09 LAB — URINALYSIS, ROUTINE W REFLEX MICROSCOPIC

## 2020-06-09 LAB — BASIC METABOLIC PANEL
Anion gap: 9 (ref 5–15)
BUN: 20 mg/dL (ref 8–23)
CO2: 26 mmol/L (ref 22–32)
Calcium: 8.9 mg/dL (ref 8.9–10.3)
Chloride: 105 mmol/L (ref 98–111)
Creatinine, Ser: 0.96 mg/dL (ref 0.61–1.24)
GFR calc Af Amer: >60 mL/min (ref >60)
GFR calc non Af Amer: >60 mL/min (ref >60)
Glucose, Bld: 124 mg/dL — ABNORMAL HIGH (ref 70–99)
Potassium: 4 mmol/L (ref 3.5–5.1)
Sodium: 140 mmol/L (ref 135–145)

## 2020-06-09 LAB — URINALYSIS, MICROSCOPIC (REFLEX): RBC / HPF: >50 RBC/hpf (ref 0–5)

## 2020-06-09 NOTE — ED Notes (Signed)
ED Provider at bedside. 

## 2020-06-09 NOTE — ED Triage Notes (Signed)
Per pt and wife-pt with hematuria and freq x today-NAD-slow gait with wife holding arm

## 2020-06-09 NOTE — Discharge Instructions (Addendum)
Your CT scan showed an abnormality of the bladder that would best be characterized with a procedure called cystoscopy that can be done with the urologist.  Dr. Melina Copa spoke with Dr. Claudia Desanctis, the on-call urologist tonight, and she recommended that you call tomorrow to schedule close follow-up appointment for reevaluation.  Return to the emergency department if any concerning signs or symptoms develop such as fevers, vomiting, back or abdominal pain, or inability to urinate.

## 2020-06-09 NOTE — ED Notes (Signed)
Patient transported to CT 

## 2020-06-09 NOTE — ED Provider Notes (Signed)
[facility] EMERGENCY DEPARTMENT Provider Note   CSN: [id number] Arrival date & time: 06/09/20  1853     History Chief Complaint  Patient presents with  . Hematuria    Ian Harrison is a 86 y.o. male with history of AAA, CAD, CKD, dementia, type 2 diabetes mellitus, dyslipidemia presents accompanied by wife for evaluation of acute onset, persistent hematuria beginning today.  Wife has also noted some urinary frequency but the patient states that this is because he has been well-hydrated.  Denies dysuria, abdominal pain, back pain, fevers, nausea, vomiting, chest pain or shortness of breath.  Denies melena, hematochezia, diarrhea, constipation.  They have not tried anything for his symptoms.  No aggravating or alleviating factors noted.  The patient is currently on Plavix and a baby aspirin daily.  The history is provided by the patient and the spouse.       Past Medical History:  Diagnosis Date  . Aortic aneurysm ([place])   . CAD (coronary artery disease)   . Chronic kidney disease   . Dementia ([place])   . Diabetes mellitus type II, non insulin dependent ([place])   . Dyslipidemia    Intolerance to atorvastatin due to myalgias  . Vasovagal syncope    Without recurrence    Patient Active Problem List   Diagnosis Date Noted  . Dementia without behavioral disturbance ([place]) 09/24/2019  . Coronary artery disease involving native coronary artery of native heart without angina pectoris 09/03/2019  . Dyslipidemia 09/03/2019  . Essential hypertension 09/03/2019  . AAA (abdominal aortic aneurysm) without rupture ([place]) 09/03/2019    Past Surgical History:  Procedure Laterality Date  . APPENDECTOMY    . HERNIA REPAIR         Family History  Problem Relation Age of Onset  . Heart attack Brother     Social History   Tobacco Use  . Smoking status: Former Research scientist (life sciences)  . Smokeless tobacco: Never Used  Substance Use Topics  . Alcohol use: Not Currently  . Drug use: Not on  file    Home Medications Prior to Admission medications   Medication Sig Start Date End Date Taking? Authorizing Provider  aspirin EC 81 MG tablet Take 81 mg by mouth daily.    [provider]  clopidogrel (PLAVIX) 75 MG tablet Take 1 tablet (75 mg total) by mouth daily. 03/05/20   Tobb, Kardie, DO  metoprolol succinate (TOPROL-XL) 25 MG 24 hr tablet Take 1 tablet (25 mg total) by mouth daily. 03/05/20   Tobb, Kardie, DO    Allergies    Atorvastatin and Morphine and related  Review of Systems   Review of Systems  Constitutional: Negative for chills and fever.  Respiratory: Negative for shortness of breath.   Cardiovascular: Negative for chest pain.  Gastrointestinal: Negative for abdominal pain, nausea and vomiting.  Genitourinary: Positive for frequency and hematuria. Negative for decreased urine volume, dysuria, flank pain, penile pain, penile swelling, scrotal swelling and urgency.  All other systems reviewed and are negative.   Physical Exam Updated Vital Signs BP 132/70 (BP Location: Right Arm)   Pulse 78   Temp 98.2 F (36.8 C) (Oral)   Resp 16   Ht 5\' 11"  (1.803 m)   Wt 82.6 kg   SpO2 100%   BMI 25.38 kg/m   Physical Exam Vitals and nursing note reviewed.  Constitutional:      General: He is not in acute distress.    Appearance: He is well-developed.  Comments: Resting comfortably in bed  HENT:     Head: Normocephalic and atraumatic.  Eyes:     General:        Right eye: No discharge.        Left eye: No discharge.     Conjunctiva/sclera: Conjunctivae normal.  Neck:     Vascular: No JVD.     Trachea: No tracheal deviation.  Cardiovascular:     Rate and Rhythm: Normal rate and regular rhythm.  Pulmonary:     Effort: Pulmonary effort is normal.     Breath sounds: Normal breath sounds.  Abdominal:     General: Bowel sounds are normal. There is no distension.     Palpations: Abdomen is soft.     Tenderness: There is no abdominal tenderness.  There is no right CVA tenderness, left CVA tenderness, guarding or rebound.  Skin:    General: Skin is warm and dry.     Findings: No erythema.  Neurological:     Mental Status: He is alert.  Psychiatric:        Behavior: Behavior normal.     ED Results / Procedures / Treatments   Labs (all labs ordered are listed, but only abnormal results are displayed) Labs Reviewed  URINALYSIS, ROUTINE W REFLEX MICROSCOPIC - Abnormal; Notable for the following components:      Result Value   Color, Urine RED (*)    APPearance TURBID (*)    Glucose, UA   (*)    Value: TEST NOT REPORTED DUE TO COLOR INTERFERENCE OF URINE PIGMENT   Hgb urine dipstick   (*)    Value: TEST NOT REPORTED DUE TO COLOR INTERFERENCE OF URINE PIGMENT   Bilirubin Urine   (*)    Value: TEST NOT REPORTED DUE TO COLOR INTERFERENCE OF URINE PIGMENT   Ketones, ur   (*)    Value: TEST NOT REPORTED DUE TO COLOR INTERFERENCE OF URINE PIGMENT   Protein, ur   (*)    Value: TEST NOT REPORTED DUE TO COLOR INTERFERENCE OF URINE PIGMENT   Nitrite   (*)    Value: TEST NOT REPORTED DUE TO COLOR INTERFERENCE OF URINE PIGMENT   Leukocytes,Ua   (*)    Value: TEST NOT REPORTED DUE TO COLOR INTERFERENCE OF URINE PIGMENT   All other components within normal limits  CBC - Abnormal; Notable for the following components:   RBC 3.71 (*)    Hemoglobin 12.3 (*)    HCT 37.7 (*)    MCV 101.6 (*)    Platelets 135 (*)    All other components within normal limits  BASIC METABOLIC PANEL - Abnormal; Notable for the following components:   Glucose, Bld 124 (*)    All other components within normal limits  URINALYSIS, MICROSCOPIC (REFLEX) - Abnormal; Notable for the following components:   Bacteria, UA FEW (*)    All other components within normal limits  URINE CULTURE    EKG None  Radiology CT Renal Stone Study  Result Date: 06/09/2020 CLINICAL DATA:  86 year old with hematuria and urinary frequency today. EXAM: CT ABDOMEN AND PELVIS  WITHOUT CONTRAST TECHNIQUE: Multidetector CT imaging of the abdomen and pelvis was performed following the standard protocol without IV contrast. COMPARISON:  None. FINDINGS: Lower chest: No acute airspace disease or pleural effusion. The heart is normal in size. Coronary artery calcifications versus stent. Tortuous descending thoracic aorta with atherosclerosis. Hepatobiliary: No focal hepatic abnormality on noncontrast exam. Calcified 16 mm intraluminal gallstone. No pericholecystic  inflammation or fluid. No biliary dilatation. Pancreas: Fatty atrophy.  No ductal dilatation or inflammation. Spleen: Normal in size without focal abnormality. Adrenals/Urinary Tract: 12 mm low-density right adrenal nodule with Hounsfield units of 7 consistent with adenoma. The left adrenal gland is normal. Small bilateral nonobstructing renal calculi. No right hydronephrosis. Suspected parapelvic cysts in the left kidney rather than left hydronephrosis. There is no definite calyceal dilatation. Both ureters are decompressed. No ureteral stone. Within the urinary bladder at the bladder base is hyperdense rounded structure measuring 6.9 x 3.7 cm. There are scattered small calcifications that appear to be within this structure rather than adjacent to. Lobulated components extend into the right aspect of the urinary trigone. There is mass effect on the bladder base from enlarged prostate gland, however this appears different density of the enlarged prostate. There is no perivesicular fat stranding. Stomach/Bowel: Colonic diverticulosis. No evidence of diverticulitis. Appendix not definitively visualized. Scattered fecalization of small bowel contents without obstruction or inflammation. Stomach is unremarkable. There is a periampullary duodenal diverticulum without inflammatory change. Vascular/Lymphatic: Aortic atherosclerosis. Suspect prior aortic aneurysm repair. There is a Jewel aortic lumen measures 3 cm greatest dimension. There are  surgical clips at the iliac bifurcation. Calcified and tortuous iliac vessels without aneurysmal dilatation. No pelvic sidewall or inguinal adenopathy. No retroperitoneal adenopathy. No upper abdominal adenopathy. Reproductive: Mildly enlarged prostate gland causes mass effect on the bladder base. Prostate measures 4.9 x 4.7 x 4.3 cm (volume = 52 cm^3). Other: No ascites/free fluid. No free air. Fat in the right inguinal canal. Postsurgical change of the anterior abdominal wall. Musculoskeletal: There are no acute or suspicious osseous abnormalities. Degenerative change throughout the lumbar spine. There are Modic endplate changes at A5-W0. Prominent Schmorl's node inferior endplate of L2. No evidence of focal bone lesion. IMPRESSION: 1. Hyperdense rounded structure in the urinary bladder at the bladder base measuring 6.9 x 3.7 cm. There are scattered small calcifications that appear to be within this structure rather than adjacent to the bladder. Differential considerations include bladder neoplasm versus chronic blood clot. Recommend direct visualization with cystoscopy. 2. Mildly enlarged prostate gland causing mass effect on the bladder base. Bladder lesion appears separate from the enlarged prostate gland. 3. Bilateral nonobstructing renal calculi. Probable parapelvic cysts in the left kidney. 4. Cholelithiasis without gallbladder inflammation. 5. Colonic diverticulosis without diverticulitis. Suspect prior infrarenal aortic aneurysm repair. Residual aortic dimension measures 3 cm. Aortic Atherosclerosis (ICD10-I70.0). Electronically Signed   By: Keith Rake M.D.   On: 06/09/2020 22:07    Procedures Procedures (including critical care time)  Medications Ordered in ED Medications - No data to display  ED Course  I have reviewed the triage vital signs and the nursing notes.  Pertinent labs & imaging results that were available during my care of the patient were reviewed by me and considered in my  medical decision making (see chart for details).  Clinical Course as of Jun 10 1506  Mon Jun 10, 7719  3576 86 year old male brought in by his wife for hematuria that started today.  He is on some blood thinners.  Not having any pain.  No prior history of hematuria.  No trauma.  Urinalysis shows gross hematuria.  Normal renal function normal white count.  CT abnormal showing question bladder mass versus clot in the bladder.  Will review with urology.   [MB]  2222 Discussed with Dr. Claudia Desanctis [facility].  She recommended outpatient follow-up in the clinic.  Does not recommend antibiotics at this time.   [  MB]    Clinical Course User Index [MB] Hayden Rasmussen, MD   MDM Rules/Calculators/A&P                          Patient and wife presenting for evaluation of acute onset hematuria.  He is currently anticoagulated on Plavix after prior stent placed restenosed.  He is afebrile, vital signs are stable.  He is nontoxic in appearance.  Wife notes urinary frequency but he denies this and feels that he is just well-hydrated.  His abdomen is soft and nontender.  He has no CVA tenderness or complaint of flank pain.  No fever, vomiting, or constitutional symptoms.  His UA shows gross hematuria, few bacteria, less concerning for UTI.  I have a low suspicion of nephrolithiasis in the absence of pain but we will obtain a renal stone study to rule out more serious pathology.  Lab work reviewed and interpreted by myself shows no leukocytosis, mild anemia, no renal insufficiency or metabolic derangements.  Doubt the mild anemia is clinically significant at this time as he is hemodynamically stable.  Renal stone study shows mildly enlarged prostate gland causing mass-effect on the bladder base as well as a hyperdense rounded structure in the urinary bladder base measuring 6.9 x 3.7 cm.  This could be indicative of blood clot versus neoplasm but would be better evaluated via cystoscopy.  CONSULT: Dr. Melina Copa spoke  with Dr. Claudia Desanctis with on-call urology service.  She recommends outpatient follow-up for cystoscopy and reevaluation.  She does not recommend starting antibiotics at this time and I agree that his symptoms are likely in the setting of his Plavix use.  Discussed work-up and findings with patient and wife.  The patient is resting comfortably, tolerating p.o. fluids.  He has urinated in the department without difficulty once more.  We will culture urine.  They will call the urologist office tomorrow to schedule close follow-up.  Discussed strict ED return precautions.  Patient and wife verbalized understanding of and agreement with plan and patient is stable for discharge at this time.   Final Clinical Impression(s) / ED Diagnoses Final diagnoses:  Gross hematuria    Rx / DC Orders ED Discharge Orders    None       Debroah Baller 06/10/20 1510    Hayden Rasmussen, MD 06/11/20 1105

## 2020-06-09 NOTE — ED Notes (Signed)
In to round on pt, states he noted blood in his urine today, has a lot of urgency and frequency with very little output

## 2020-06-11 LAB — URINE CULTURE: Culture: >=10000 — AB

## 2020-06-12 ENCOUNTER — Telehealth: Payer: Self-pay | Admitting: Cardiology

## 2020-06-12 ENCOUNTER — Other Ambulatory Visit: Payer: Self-pay | Admitting: Urology

## 2020-06-12 NOTE — Telephone Encounter (Signed)
   [facility] Pre-operative Risk Assessment    HEARTCARE STAFF: - Please ensure there is not already an duplicate clearance open for this procedure. - Under Visit Info/Reason for Call, type in Other and utilize the format Clearance MM/DD/YY or Clearance TBD. Do not use dashes or single digits. - If request is for dental extraction, please clarify the # of teeth to be extracted.  Request for surgical clearance:  1. What type of surgery is being performed? Bladder Removal   2. When is this surgery scheduled? 06/23/20  3. What type of clearance is required (medical clearance vs. Pharmacy clearance to hold med vs. Both)? Both  4. Are there any medications that need to be held prior to surgery and how long?Asprin 5 days and pl 5 to 7 days   5. Practice name and name of physician performing surgery? [facility]  6. What is the office phone number? [phone] ext [phone]   7.   What is the office fax number?[phone]  8.   Anesthesia type (None, local, MAC, general) ? [place]ral   Shon Millet 06/12/2020, 3:34 PM  _________________________________________________________________   (provider comments below)

## 2020-06-13 ENCOUNTER — Emergency Department (HOSPITAL_BASED_OUTPATIENT_CLINIC_OR_DEPARTMENT_OTHER)
Admission: EM | Admit: 2020-06-13 | Discharge: 2020-06-13 | Disposition: A | Discharge status: Home / Self Care | Payer: Medicare Other | Attending: Emergency Medicine | Admitting: Emergency Medicine

## 2020-06-13 ENCOUNTER — Encounter (HOSPITAL_BASED_OUTPATIENT_CLINIC_OR_DEPARTMENT_OTHER): Payer: Self-pay | Admitting: Emergency Medicine

## 2020-06-13 ENCOUNTER — Other Ambulatory Visit: Payer: Self-pay

## 2020-06-13 DIAGNOSIS — R338 Other retention of urine: Secondary | ICD-10-CM | POA: Diagnosis not present

## 2020-06-13 DIAGNOSIS — I2510 Atherosclerotic heart disease of native coronary artery without angina pectoris: Secondary | ICD-10-CM | POA: Diagnosis not present

## 2020-06-13 DIAGNOSIS — I129 Hypertensive chronic kidney disease with stage 1 through stage 4 chronic kidney disease, or unspecified chronic kidney disease: Secondary | ICD-10-CM | POA: Diagnosis not present

## 2020-06-13 DIAGNOSIS — Z7982 Long term (current) use of aspirin: Secondary | ICD-10-CM | POA: Diagnosis not present

## 2020-06-13 DIAGNOSIS — Z87891 Personal history of nicotine dependence: Secondary | ICD-10-CM | POA: Insufficient documentation

## 2020-06-13 DIAGNOSIS — N189 Chronic kidney disease, unspecified: Secondary | ICD-10-CM | POA: Insufficient documentation

## 2020-06-13 DIAGNOSIS — Z79899 Other long term (current) drug therapy: Secondary | ICD-10-CM | POA: Insufficient documentation

## 2020-06-13 DIAGNOSIS — E1122 Type 2 diabetes mellitus with diabetic chronic kidney disease: Secondary | ICD-10-CM | POA: Diagnosis not present

## 2020-06-13 DIAGNOSIS — R1030 Lower abdominal pain, unspecified: Secondary | ICD-10-CM | POA: Diagnosis present

## 2020-06-13 LAB — URINALYSIS, ROUTINE W REFLEX MICROSCOPIC

## 2020-06-13 LAB — URINALYSIS, MICROSCOPIC (REFLEX)
RBC / HPF: >50 RBC/hpf (ref 0–5)
Squamous Epithelial / HPF: NONE SEEN (ref 0–5)

## 2020-06-13 NOTE — ED Triage Notes (Addendum)
Pt seen here 8/16 for hematuria.  Saw urologist yesterday.  Was told by both EDP and Urologist to come to Ed if he had any abdominal pain.  Since this morning he is having low abdominal pain and continues to have hematuria.  Wife sts it has been at least 3 days since pt has had a BM and "that may be part of the problem."

## 2020-06-13 NOTE — ED Notes (Signed)
Extensive teaching done with pts wife on care and use of urinary drainage bags and catheter care.  She voiced understanding.

## 2020-06-13 NOTE — Progress Notes (Addendum)
COVID Vaccine Completed:  x2 Date COVID Vaccine completed:  Unsure of date COVID vaccine manufacturer: [employer]   PCP - Mackie Pai, PA-C Cardiologist - Godfrey Pick Tobb, DO.  Last OV 05-14-20  Clearance in chart dated 06-16-20 by Kerin Ransom, PA-C  Chest x-ray -  EKG - 09-03-19 in Epic Stress Test -  ECHO - 09-06-19 in Epic Cardiac Cath - 10-16-19 in Epic AAA Duplex - 01-30-16 in Epic Sleep Study -  CPAP -   Fasting Blood Sugar -  Checks Blood Sugar _____ times a day  Blood Thinner Instructions:  Plavix 75 mg. Ok to hold for seven days. Aspirin Instructions: ASA 81 mg.  Would prefer he remain on but if not can hold for 5 days Last Dose:   06-15-20  Anesthesia review: CAD, AAA, hx of MI with stent placement  Patient denies shortness of breath, fever, cough and chest pain at PAT appointment   Patient verbalized understanding of instructions that were given to them at the PAT appointment. Patient was also instructed that they will need to review over the PAT instructions again at home before surgery.

## 2020-06-13 NOTE — Patient Instructions (Addendum)
DUE TO COVID-19 ONLY ONE VISITOR IS ALLOWED TO COME WITH YOU AND STAY IN THE WAITING ROOM ONLY DURING PRE  OP AND PROCEDURE.   IF YOU WILL BE ADMITTED INTO THE HOSPITAL YOU ARE ALLOWED ONE SUPPORT PERSON DURING VISITATION HOURS ONLY  (10AM -8PM)    The support person may change daily.  The support person must pass our screening, gel in and out, and wear a mask at all times, including in the patients room.  Patients must also wear a mask when staff or their support person are in the room.   COVID SWAB TESTING MUST BE COMPLETED ON:  Thursday, 06-19-20 @ 12:00 PM    [address] [place], [place] [address]  (Must self quarantine after testing. Follow instructions on handout.)        Your procedure is scheduled on: Monday, 06-23-20   Report to [facility] Main  Entrance     Report to admitting at 10:00 AM   Call this number if you have problems the morning of surgery [phone]   Do not eat food :After Midnight.   May have liquids until 9:00 AM  day of surgery  CLEAR LIQUID DIET  Foods Allowed                                                                     Foods Excluded  Water, Black Coffee and tea, regular and decaf            liquids that you cannot  Plain Jell-O in any flavor  (No red)                                   see through such as: Fruit ices (not with fruit pulp)                                      milk, soups, orange juice              Iced Popsicles (No red)                                      All solid food                                   Apple juices Sports drinks like Gatorade (No red) Lightly seasoned clear broth or consume(fat free) Sugar, honey syrup     Complete one Ensure drink the morning of surgery at       the day of surgery.     Oral Hygiene is also important to reduce your risk of infection.                                    Remember - BRUSH YOUR TEETH THE MORNING OF SURGERY WITH YOUR REGULAR TOOTHPASTE   Do NOT smoke after  Midnight  Take these medicines the morning of surgery with A SIP OF WATER:  Metoprolol  DO NOT TAKE ANY ORAL DIABETIC MEDICATIONS DAY OF YOUR SURGERY                               You may not have any metal on your body including jewelry, and body piercings               Do not lotions, powders, perfumes/cologne, or deodorant                         Men may shave face and neck.   Do not bring valuables to the hospital. [place].   Contacts, dentures or bridgework may not be worn into surgery.      Patients discharged the day of surgery will not be allowed to drive home.   Special Instructions: Bring a copy of your healthcare power of attorney and living will documents         the day of surgery if you haven't  scanned them in before.              Please read over the following fact sheets you were given: IF YOU HAVE QUESTIONS ABOUT YOUR PRE OP INSTRUCTIONS PLEASE  CALL [phone]   [facility] - Preparing for Surgery Before surgery, you can play an important role.  Because skin is not sterile, your skin needs to be as free of germs as possible.  You can reduce the number of germs on your skin by washing with CHG (chlorahexidine gluconate) soap before surgery.  CHG is an antiseptic cleaner which kills germs and bonds with the skin to continue killing germs even after washing. Please DO NOT use if you have an allergy to CHG or antibacterial soaps.  If your skin becomes reddened/irritated stop using the CHG and inform your nurse when you arrive at Short Stay. Do not shave (including legs and underarms) for at least 48 hours prior to the first CHG shower.  You may shave your face/neck.  Please follow these instructions carefully:  1.  Shower with CHG Soap the night before surgery and the  morning of surgery.  2.  If you choose to wash your hair, wash your hair first as usual with your normal  shampoo.  3.  After you shampoo, rinse your hair and  body thoroughly to remove the shampoo.                             4.  Use CHG as you would any other liquid soap.  You can apply chg directly to the skin and wash.  Gently with a scrungie or clean washcloth.  5.  Apply the CHG Soap to your body ONLY FROM THE NECK DOWN.   Do   not use on face/ open                           Wound or open sores. Avoid contact with eyes, ears mouth and   genitals (private parts).                       Wash face,  Genitals (private parts) with your normal soap.  6.  Wash thoroughly, paying special attention to the area where your    surgery  will be performed.  7.  Thoroughly rinse your body with warm water from the neck down.  8.  DO NOT shower/wash with your normal soap after using and rinsing off the CHG Soap.                9.  Pat yourself dry with a clean towel.            10.  Wear clean pajamas.            11.  Place clean sheets on your bed the night of your first shower and do not  sleep with pets. Day of Surgery : Do not apply any lotions/deodorants the morning of surgery.  Please wear clean clothes to the hospital/surgery center.  FAILURE TO FOLLOW THESE INSTRUCTIONS MAY RESULT IN THE CANCELLATION OF YOUR SURGERY  PATIENT SIGNATURE_________________________________  NURSE SIGNATURE__________________________________  ________________________________________________________________________

## 2020-06-13 NOTE — Discharge Instructions (Addendum)
You may take your MiraLAX as needed for constipation.  You can pick this up over-the-counter.  1-2 doses per day as needed until you have a good bowel movement.

## 2020-06-13 NOTE — ED Provider Notes (Signed)
[facility] EMERGENCY DEPARTMENT Provider Note   CSN: [id number] Arrival date & time: 06/13/20  8099     History Chief Complaint  Patient presents with  . Abdominal Pain    Ian Harrison is a 86 y.o. male.  HPI   86 year old male with abdominal pain.  Currently being worked up for Financial trader.  Had been painless up until the past day.  Lower abdominal discomfort.  Does not lateralize.  Constant urge to urinate but only going few drops at a time.  No dysuria.  Past Medical History:  Diagnosis Date  . Aortic aneurysm ([place])   . CAD (coronary artery disease)   . Chronic kidney disease   . Dementia ([place])   . Diabetes mellitus type II, non insulin dependent ([place])   . Dyslipidemia    Intolerance to atorvastatin due to myalgias  . Vasovagal syncope    Without recurrence    Patient Active Problem List   Diagnosis Date Noted  . Dementia without behavioral disturbance ([place]) 09/24/2019  . Coronary artery disease involving native coronary artery of native heart without angina pectoris 09/03/2019  . Dyslipidemia 09/03/2019  . Essential hypertension 09/03/2019  . AAA (abdominal aortic aneurysm) without rupture ([place]) 09/03/2019    Past Surgical History:  Procedure Laterality Date  . APPENDECTOMY    . HERNIA REPAIR         Family History  Problem Relation Age of Onset  . Heart attack Brother     Social History   Tobacco Use  . Smoking status: Former Research scientist (life sciences)  . Smokeless tobacco: Never Used  Substance Use Topics  . Alcohol use: Not Currently  . Drug use: Never    Home Medications Prior to Admission medications   Medication Sig Start Date End Date Taking? Authorizing Provider  aspirin EC 81 MG tablet Take 81 mg by mouth daily.    [provider]  clopidogrel (PLAVIX) 75 MG tablet Take 1 tablet (75 mg total) by mouth daily. 03/05/20   Tobb, Kardie, DO  metoprolol succinate (TOPROL-XL) 25 MG 24 hr tablet Take 1 tablet (25 mg total) by  mouth daily. 03/05/20   Tobb, Kardie, DO    Allergies    Atorvastatin and Morphine and related  Review of Systems   Review of Systems All systems reviewed and negative, other than as noted in HPI.  Physical Exam Updated Vital Signs BP 139/63 (BP Location: Right Arm)   Pulse 60   Temp 98.4 F (36.9 C) (Oral)   Resp 16   Ht 5\' 11"  (1.803 m)   Wt 83 kg   SpO2 96%   BMI 25.51 kg/m   Physical Exam Vitals and nursing note reviewed.  Constitutional:      General: He is not in acute distress.    Appearance: He is well-developed.  HENT:     Head: Normocephalic and atraumatic.  Eyes:     General:        Right eye: No discharge.        Left eye: No discharge.     Conjunctiva/sclera: Conjunctivae normal.  Cardiovascular:     Rate and Rhythm: Normal rate and regular rhythm.     Heart sounds: Normal heart sounds. No murmur heard.  No friction rub. No gallop.   Pulmonary:     Effort: Pulmonary effort is normal. No respiratory distress.     Breath sounds: Normal breath sounds.  Abdominal:     General: There is no distension.  Palpations: Abdomen is soft.     Tenderness: There is abdominal tenderness.     Comments: Mild suprapubic tenderness without rebound or guarding.  Musculoskeletal:        General: No tenderness.     Cervical back: Neck supple.  Skin:    General: Skin is warm and dry.  Neurological:     Mental Status: He is alert.  Psychiatric:        Behavior: Behavior normal.        Thought Content: Thought content normal.     ED Results / Procedures / Treatments   Labs (all labs ordered are listed, but only abnormal results are displayed) Labs Reviewed  URINALYSIS, ROUTINE W REFLEX MICROSCOPIC - Abnormal; Notable for the following components:      Result Value   Color, Urine BROWN (*)    APPearance TURBID (*)    Glucose, UA   (*)    Value: TEST NOT REPORTED DUE TO COLOR INTERFERENCE OF URINE PIGMENT   Hgb urine dipstick   (*)    Value: TEST NOT REPORTED  DUE TO COLOR INTERFERENCE OF URINE PIGMENT   Bilirubin Urine   (*)    Value: TEST NOT REPORTED DUE TO COLOR INTERFERENCE OF URINE PIGMENT   Ketones, ur   (*)    Value: TEST NOT REPORTED DUE TO COLOR INTERFERENCE OF URINE PIGMENT   Protein, ur   (*)    Value: TEST NOT REPORTED DUE TO COLOR INTERFERENCE OF URINE PIGMENT   Nitrite   (*)    Value: TEST NOT REPORTED DUE TO COLOR INTERFERENCE OF URINE PIGMENT   Leukocytes,Ua   (*)    Value: TEST NOT REPORTED DUE TO COLOR INTERFERENCE OF URINE PIGMENT   All other components within normal limits  URINALYSIS, MICROSCOPIC (REFLEX) - Abnormal; Notable for the following components:   Bacteria, UA MANY (*)    All other components within normal limits  URINE CULTURE    EKG None  Radiology No results found.  Procedures Procedures (including critical care time)  Medications Ordered in ED Medications - No data to display  ED Course  I have reviewed the triage vital signs and the nursing notes.  Pertinent labs & imaging results that were available during my care of the patient were reviewed by me and considered in my medical decision making (see chart for details).    MDM Rules/Calculators/A&P                          86 year old male with lower abdominal pain from urinary retention.  Bladder scan with over 600 cc.  A Foley catheter was placed with over 700 cc of urine out.  Pain resolved.  Continued Foley care was discussed with patient/wife.  Has established urology care with follow-up later this month.  Final Clinical Impression(s) / ED Diagnoses Final diagnoses:  Acute urinary retention    Rx / DC Orders ED Discharge Orders    None       Virgel Manifold, MD 06/13/20 [phone]

## 2020-06-14 LAB — URINE CULTURE: Culture: NO GROWTH

## 2020-06-15 ENCOUNTER — Emergency Department (HOSPITAL_BASED_OUTPATIENT_CLINIC_OR_DEPARTMENT_OTHER)
Admission: EM | Admit: 2020-06-15 | Discharge: 2020-06-15 | Discharge status: Absent without leave | Payer: Medicare Other

## 2020-06-15 ENCOUNTER — Other Ambulatory Visit: Payer: Self-pay

## 2020-06-15 NOTE — Telephone Encounter (Signed)
Dr. Harriet Masson to review. Patient is a 86 yo male who has a bladder mass that need to be removed. Surgeon's office request to hold aspirin for 5 days and plavix for 5-7 days. He was stable when last seen by Dr. Harriet Masson in July 2021.   Dr. Harriet Masson, please send your reply regarding antiplatelet therapy to [facility] PREOP

## 2020-06-16 NOTE — Telephone Encounter (Signed)
He can hold the Plavix for 7 days. If they are able to do the procedure with aspirin 81 mg without interruption that would be ideal.  If not aspirin can be held for 5 days.

## 2020-06-16 NOTE — Telephone Encounter (Signed)
   Primary Cardiologist: Berniece Salines, DO  Chart reviewed and I spoke with Ms Ratchford today on the phone as part of pre-operative protocol coverage. Given past medical history and time since last visit, based on ACC/AHA guidelines, Mckenna Boruff would be at acceptable risk for the planned procedure without further cardiovascular testing.   Per Dr Harriet Masson- OK to hold Plavix 7 days pre op. We would prefer the patient remain on aspirin but if needed OK to hold this 5 days pre op.  I discussed this with Ms Reierson today.   I will route this recommendation to the requesting party via Epic fax function and remove from pre-op pool.  Please call with questions.  Kerin Ransom, PA-C 06/16/2020, 11:40 AM

## 2020-06-18 ENCOUNTER — Other Ambulatory Visit: Payer: Self-pay

## 2020-06-18 ENCOUNTER — Encounter (HOSPITAL_COMMUNITY): Payer: Self-pay

## 2020-06-18 ENCOUNTER — Encounter (HOSPITAL_COMMUNITY)
Admission: RE | Admit: 2020-06-18 | Discharge: 2020-06-18 | Disposition: A | Discharge status: Home / Self Care | Payer: Medicare Other | Source: Ambulatory Visit | Attending: Urology | Admitting: Urology

## 2020-06-18 DIAGNOSIS — Z955 Presence of coronary angioplasty implant and graft: Secondary | ICD-10-CM | POA: Insufficient documentation

## 2020-06-18 DIAGNOSIS — Z01812 Encounter for preprocedural laboratory examination: Secondary | ICD-10-CM | POA: Insufficient documentation

## 2020-06-18 DIAGNOSIS — Z87891 Personal history of nicotine dependence: Secondary | ICD-10-CM | POA: Insufficient documentation

## 2020-06-18 DIAGNOSIS — Z7982 Long term (current) use of aspirin: Secondary | ICD-10-CM | POA: Insufficient documentation

## 2020-06-18 DIAGNOSIS — Z7901 Long term (current) use of anticoagulants: Secondary | ICD-10-CM | POA: Diagnosis not present

## 2020-06-18 DIAGNOSIS — I252 Old myocardial infarction: Secondary | ICD-10-CM | POA: Insufficient documentation

## 2020-06-18 DIAGNOSIS — E1122 Type 2 diabetes mellitus with diabetic chronic kidney disease: Secondary | ICD-10-CM | POA: Diagnosis not present

## 2020-06-18 DIAGNOSIS — N189 Chronic kidney disease, unspecified: Secondary | ICD-10-CM | POA: Diagnosis not present

## 2020-06-18 DIAGNOSIS — R310 Gross hematuria: Secondary | ICD-10-CM | POA: Diagnosis not present

## 2020-06-18 DIAGNOSIS — E785 Hyperlipidemia, unspecified: Secondary | ICD-10-CM | POA: Insufficient documentation

## 2020-06-18 DIAGNOSIS — I2510 Atherosclerotic heart disease of native coronary artery without angina pectoris: Secondary | ICD-10-CM | POA: Insufficient documentation

## 2020-06-18 HISTORY — DX: Malignant (primary) neoplasm, unspecified: C80.1

## 2020-06-18 HISTORY — DX: Acute myocardial infarction, unspecified: I21.9

## 2020-06-18 LAB — HEMOGLOBIN A1C
Hgb A1c MFr Bld: 6 % — ABNORMAL HIGH (ref 4.8–5.6)
Mean Plasma Glucose: 125.5 mg/dL

## 2020-06-18 LAB — GLUCOSE, CAPILLARY: Glucose-Capillary: 84 mg/dL (ref 70–99)

## 2020-06-19 ENCOUNTER — Other Ambulatory Visit (HOSPITAL_COMMUNITY)
Admission: RE | Admit: 2020-06-19 | Discharge: 2020-06-19 | Disposition: A | Discharge status: Home / Self Care | Payer: Medicare Other | Source: Ambulatory Visit | Attending: Urology | Admitting: Urology

## 2020-06-19 DIAGNOSIS — Z01812 Encounter for preprocedural laboratory examination: Secondary | ICD-10-CM | POA: Insufficient documentation

## 2020-06-19 DIAGNOSIS — Z20822 Contact with and (suspected) exposure to covid-19: Secondary | ICD-10-CM | POA: Diagnosis not present

## 2020-06-19 LAB — SARS CORONAVIRUS 2 (TAT 6-24 HRS): SARS Coronavirus 2: NEGATIVE

## 2020-06-19 NOTE — Progress Notes (Signed)
Anesthesia Chart Review   Case: [id number] Date/Time: 06/23/20 1145   Procedures:      TRANSURETHRAL RESECTION OF BLADDER TUMOR (TURBT) (N/A ) - 90 MINS     CYSTOSCOPY WITH CLOT EVACUATION (N/A )   Anesthesia type: General   Pre-op diagnosis: GROSS HEMATURIA, POSSIBLE MASS   Location: [facility] ROOM 03 / [facility]   Surgeons: Ian Fries, MD      DISCUSSION:86 y.o. former smoker with h/o CKD, DM II, CAD (stent to RCA 2005, 2016), AAA s/p repair 2007, gross hematuria, possible mass scheduled for above procedure 06/23/2020 with Ian. Jacalyn Harrison.   Per cardiology preoperative risk assessment 06/16/2020, "Chart reviewed and I spoke with Ian Harrison today on the phone as part of pre-operative protocol coverage. Given past medical history and time since last visit, based on ACC/AHA guidelines, Ian Harrison would be at acceptable risk for the planned procedure without further cardiovascular testing.   Per Ian Harrison- OK to hold Plavix 7 days pre op. We would prefer the patient remain on aspirin but if needed OK to hold this 5 days pre op.  I discussed this with Ian Harrison today."  Anticipate pt can proceed with planned procedure barring acute status change.   VS: Pulse 81   Temp 36.8 C (Oral)   Resp 16   Ht 5\' 11"  (1.803 m)   Wt 83.6 kg   BMI 25.69 kg/m   PROVIDERS: Saguier, Percell Miller, PA-C is PCP   Ian Salines, DO is Cardiologist  LABS: Labs reviewed: Acceptable for surgery. (all labs ordered are listed, but only abnormal results are displayed)  Labs Reviewed  HEMOGLOBIN A1C - Abnormal; Notable for the following components:      Result Value   Hgb A1c MFr Bld 6.0 (*)    All other components within normal limits  GLUCOSE, CAPILLARY     IMAGES:   EKG: 09/03/2019 Rate 60 bpm  NSR  CV: Echo 09/06/2019 IMPRESSIONS    1. Left ventricular ejection fraction, by visual estimation, is 60 to  65%. The left ventricle has normal function. Left ventricular septal wall  thickness  was mildly increased. Mildly increased left ventricular  posterior wall thickness. There is mildly  increased left ventricular hypertrophy.  2. Left ventricular diastolic parameters are consistent with Grade I  diastolic dysfunction (impaired relaxation).  3. Global right ventricle has normal systolic function.The right  ventricular size is normal. No increase in right ventricular wall  thickness.  4. Left atrial size was normal.  5. Right atrial size was normal.  6. The mitral valve is normal in structure. No evidence of mitral valve  regurgitation. No evidence of mitral stenosis.  7. The tricuspid valve is normal in structure. Tricuspid valve  regurgitation is not demonstrated.  8. The aortic valve is normal in structure. Aortic valve regurgitation is  trivial. Mild aortic valve sclerosis without stenosis.  9. The pulmonic valve was normal in structure. Pulmonic valve  regurgitation is not visualized.  10. Normal pulmonary artery systolic pressure.  Past Medical History:  Diagnosis Date  . Aortic aneurysm ([place])   . CAD (coronary artery disease)   . Cancer [facility])    Skin cancer  . Chronic kidney disease   . Dementia ([place])   . Diabetes mellitus type II, non insulin dependent ([place])   . Dyslipidemia    Intolerance to atorvastatin due to myalgias  . Myocardial infarction ([place])    15 years ago  . Vasovagal syncope    Without recurrence  Past Surgical History:  Procedure Laterality Date  . ABDOMINAL AORTIC ANEURYSM REPAIR    . APPENDECTOMY    . CATARACT EXTRACTION W/ INTRAOCULAR LENS IMPLANT    . HERNIA REPAIR    . WISDOM TOOTH EXTRACTION      MEDICATIONS: . aspirin EC 81 MG tablet  . clopidogrel (PLAVIX) 75 MG tablet  . metoprolol succinate (TOPROL-XL) 25 MG 24 hr tablet   No current facility-administered medications for this encounter.    Konrad Felix, PA-C [facility] [phone]

## 2020-06-22 ENCOUNTER — Telehealth: Payer: Self-pay | Admitting: Medical

## 2020-06-22 NOTE — Telephone Encounter (Signed)
Reviewed that pt cardiologist ok'd to DC Plavix prior to pt surgery. Pt and family notified per cardiologist note.

## 2020-06-23 ENCOUNTER — Ambulatory Visit (HOSPITAL_COMMUNITY)
Admission: RE | Admit: 2020-06-23 | Discharge: 2020-06-23 | Disposition: A | Discharge status: Home / Self Care | Payer: Medicare Other | Attending: Urology | Admitting: Urology

## 2020-06-23 ENCOUNTER — Encounter (HOSPITAL_COMMUNITY)
Admission: RE | Disposition: A | Discharge status: Home / Self Care | Payer: Self-pay | Source: Home / Self Care | Attending: Urology

## 2020-06-23 ENCOUNTER — Ambulatory Visit (HOSPITAL_COMMUNITY): Payer: Medicare Other | Admitting: Anesthesiology

## 2020-06-23 ENCOUNTER — Ambulatory Visit (HOSPITAL_COMMUNITY): Payer: Medicare Other | Admitting: Physician Assistant

## 2020-06-23 ENCOUNTER — Encounter (HOSPITAL_COMMUNITY): Payer: Self-pay | Admitting: Urology

## 2020-06-23 DIAGNOSIS — Z7982 Long term (current) use of aspirin: Secondary | ICD-10-CM | POA: Insufficient documentation

## 2020-06-23 DIAGNOSIS — N210 Calculus in bladder: Secondary | ICD-10-CM | POA: Insufficient documentation

## 2020-06-23 DIAGNOSIS — Z79899 Other long term (current) drug therapy: Secondary | ICD-10-CM | POA: Insufficient documentation

## 2020-06-23 DIAGNOSIS — R310 Gross hematuria: Secondary | ICD-10-CM | POA: Diagnosis present

## 2020-06-23 DIAGNOSIS — I2510 Atherosclerotic heart disease of native coronary artery without angina pectoris: Secondary | ICD-10-CM | POA: Diagnosis not present

## 2020-06-23 DIAGNOSIS — I10 Essential (primary) hypertension: Secondary | ICD-10-CM | POA: Insufficient documentation

## 2020-06-23 DIAGNOSIS — F0390 Unspecified dementia without behavioral disturbance: Secondary | ICD-10-CM | POA: Insufficient documentation

## 2020-06-23 DIAGNOSIS — N401 Enlarged prostate with lower urinary tract symptoms: Secondary | ICD-10-CM | POA: Diagnosis not present

## 2020-06-23 DIAGNOSIS — I252 Old myocardial infarction: Secondary | ICD-10-CM | POA: Insufficient documentation

## 2020-06-23 DIAGNOSIS — Z87891 Personal history of nicotine dependence: Secondary | ICD-10-CM | POA: Diagnosis not present

## 2020-06-23 DIAGNOSIS — Z7902 Long term (current) use of antithrombotics/antiplatelets: Secondary | ICD-10-CM | POA: Insufficient documentation

## 2020-06-23 HISTORY — PX: TRANSURETHRAL RESECTION OF BLADDER TUMOR: SHX2575

## 2020-06-23 HISTORY — PX: CYSTOSCOPY WITH FULGERATION: SHX6638

## 2020-06-23 LAB — BASIC METABOLIC PANEL
Anion gap: 11 (ref 5–15)
BUN: 15 mg/dL (ref 8–23)
CO2: 22 mmol/L (ref 22–32)
Calcium: 8.6 mg/dL — ABNORMAL LOW (ref 8.9–10.3)
Chloride: 104 mmol/L (ref 98–111)
Creatinine, Ser: 0.94 mg/dL (ref 0.61–1.24)
GFR calc Af Amer: >60 mL/min (ref >60)
GFR calc non Af Amer: >60 mL/min (ref >60)
Glucose, Bld: 113 mg/dL — ABNORMAL HIGH (ref 70–99)
Potassium: 5.5 mmol/L — ABNORMAL HIGH (ref 3.5–5.1)
Sodium: 137 mmol/L (ref 135–145)

## 2020-06-23 LAB — GLUCOSE, CAPILLARY
Glucose-Capillary: 96 mg/dL (ref 70–99)
Glucose-Capillary: 88 mg/dL (ref 70–99)

## 2020-06-23 LAB — CBC
HCT: 38.5 % — ABNORMAL LOW (ref 39.0–52.0)
Hemoglobin: 12.6 g/dL — ABNORMAL LOW (ref 13.0–17.0)
MCH: 33.5 pg (ref 26.0–34.0)
MCHC: 32.7 g/dL (ref 30.0–36.0)
MCV: 102.4 fL — ABNORMAL HIGH (ref 80.0–100.0)
Platelets: 175 10*3/uL (ref 150–400)
RBC: 3.76 MIL/uL — ABNORMAL LOW (ref 4.22–5.81)
RDW: 14.1 % (ref 11.5–15.5)
WBC: 6.4 10*3/uL (ref 4.0–10.5)
nRBC: 0 % (ref 0.0–0.2)

## 2020-06-23 SURGERY — TURBT (TRANSURETHRAL RESECTION OF BLADDER TUMOR)
Anesthesia: General

## 2020-06-23 MED ORDER — ORAL CARE MOUTH RINSE: 15.0000 mL | Freq: Once | OROMUCOSAL | Status: AC

## 2020-06-23 MED ORDER — SODIUM CHLORIDE 0.9 % IR SOLN
Status: DC | PRN
  Administered 2020-06-23 (×2): 3000 mL

## 2020-06-23 MED ORDER — LIDOCAINE HCL 1 % IJ SOLN
INTRAMUSCULAR | Status: DC | PRN
  Administered 2020-06-23: 50 mg via INTRADERMAL

## 2020-06-23 MED ORDER — FENTANYL CITRATE (PF) 100 MCG/2ML IJ SOLN
25.0000 ug | INTRAMUSCULAR | Status: DC | PRN
  Administered 2020-06-23 (×2): 25 ug via INTRAVENOUS

## 2020-06-23 MED ORDER — STERILE WATER FOR IRRIGATION IR SOLN
Status: DC | PRN
  Administered 2020-06-23: 10 mL

## 2020-06-23 MED ORDER — FENTANYL CITRATE (PF) 100 MCG/2ML IJ SOLN
INTRAMUSCULAR | Status: AC
  Filled 2020-06-23: qty 2

## 2020-06-23 MED ORDER — FENTANYL CITRATE (PF) 100 MCG/2ML IJ SOLN
INTRAMUSCULAR | Status: DC | PRN
  Administered 2020-06-23 (×4): 25 ug via INTRAVENOUS

## 2020-06-23 MED ORDER — ONDANSETRON HCL 4 MG/2ML IJ SOLN
INTRAMUSCULAR | Status: AC
  Filled 2020-06-23: qty 2

## 2020-06-23 MED ORDER — SODIUM CHLORIDE 0.9 % IV SOLN
2.0000 g | INTRAVENOUS | Status: AC
  Administered 2020-06-23: 2 g via INTRAVENOUS
  Filled 2020-06-23: qty 20

## 2020-06-23 MED ORDER — PROPOFOL 10 MG/ML IV BOLUS
INTRAVENOUS | Status: AC
  Filled 2020-06-23: qty 20

## 2020-06-23 MED ORDER — ONDANSETRON HCL 4 MG/2ML IJ SOLN
INTRAMUSCULAR | Status: DC | PRN
  Administered 2020-06-23: 4 mg via INTRAVENOUS

## 2020-06-23 MED ORDER — LIDOCAINE 2% (20 MG/ML) 5 ML SYRINGE
INTRAMUSCULAR | Status: AC
  Filled 2020-06-23: qty 5

## 2020-06-23 MED ORDER — PROPOFOL 10 MG/ML IV BOLUS
INTRAVENOUS | Status: DC | PRN
  Administered 2020-06-23: 110 mg via INTRAVENOUS

## 2020-06-23 MED ORDER — MIDAZOLAM HCL 2 MG/2ML IJ SOLN
INTRAMUSCULAR | Status: AC
  Filled 2020-06-23: qty 2

## 2020-06-23 MED ORDER — CHLORHEXIDINE GLUCONATE 0.12 % MT SOLN
15.0000 mL | Freq: Once | OROMUCOSAL | Status: AC
  Administered 2020-06-23: 15 mL via OROMUCOSAL

## 2020-06-23 MED ORDER — EPHEDRINE SULFATE 50 MG/ML IJ SOLN
INTRAMUSCULAR | Status: DC | PRN
  Administered 2020-06-23: 10 mg via INTRAVENOUS

## 2020-06-23 MED ORDER — LACTATED RINGERS IV SOLN: INTRAVENOUS | Status: DC

## 2020-06-23 SURGICAL SUPPLY — 22 items
BAG DRN RND TRDRP ANRFLXCHMBR (UROLOGICAL SUPPLIES) ×1
BAG URINE DRAIN 2000ML AR STRL (UROLOGICAL SUPPLIES) ×3 IMPLANT
BAG URO CATCHER STRL LF (MISCELLANEOUS) ×3 IMPLANT
CATH FOLEY 2WAY 5CC 20FR (CATHETERS) ×3 IMPLANT
CLOTH BEACON ORANGE TIMEOUT ST (SAFETY) ×3 IMPLANT
ELECT COAG BIPOLAR CYL 1.2MMM (ELECTROSURGICAL)
ELECT REM PT RETURN 15FT ADLT (MISCELLANEOUS) ×3 IMPLANT
ELECTRODE COAG BIPLR CYL 1.2MM (ELECTROSURGICAL) IMPLANT
GLOVE BIO SURGEON STRL SZ 6.5 (GLOVE) ×2 IMPLANT
GLOVE BIO SURGEONS STRL SZ 6.5 (GLOVE) ×1
GOWN STRL REUS W/TWL LRG LVL3 (GOWN DISPOSABLE) ×3 IMPLANT
HOLDER FOLEY CATH W/STRAP (MISCELLANEOUS) ×3 IMPLANT
KIT TURNOVER KIT A (KITS) IMPLANT
LOOP CUT BIPOLAR 24F LRG (ELECTROSURGICAL) ×3 IMPLANT
MANIFOLD NEPTUNE II (INSTRUMENTS) ×3 IMPLANT
PACK CYSTO (CUSTOM PROCEDURE TRAY) ×3 IMPLANT
SYR CONTROL 10ML LL (SYRINGE) ×3 IMPLANT
SYR TOOMEY IRRIG 70ML (MISCELLANEOUS)
SYRINGE TOOMEY IRRIG 70ML (MISCELLANEOUS) IMPLANT
TUBING CONNECTING 10 (TUBING) ×2 IMPLANT
TUBING CONNECTING 10' (TUBING) ×1
TUBING UROLOGY SET (TUBING) ×3 IMPLANT

## 2020-06-23 NOTE — Anesthesia Procedure Notes (Signed)
Procedure Name: LMA Insertion Date/Time: 06/23/2020 11:57 AM Performed by: Garrel Ridgel, CRNA Pre-anesthesia Checklist: Patient identified, Emergency Drugs available, Suction available, Patient being monitored and Timeout performed Patient Re-evaluated:Patient Re-evaluated prior to induction Oxygen Delivery Method: Circle system utilized Preoxygenation: Pre-oxygenation with 100% oxygen Induction Type: IV induction Ventilation: Mask ventilation without difficulty LMA: LMA inserted LMA Size: 4.0 Number of attempts: 1 Placement Confirmation: positive ETCO2 Tube secured with: Tape Dental Injury: Teeth and Oropharynx as per pre-operative assessment

## 2020-06-23 NOTE — Transfer of Care (Signed)
Immediate Anesthesia Transfer of Care Note  Patient: Ian Harrison  Procedure(s) Performed: TRANSURETHRAL RESECTION OF BLADDER TUMOR (TURBT) (N/A ) CYSTOSCOPY WITH CLOT EVACUATION (N/A )  Patient Location: PACU  Anesthesia Type:General  Level of Consciousness: awake, alert  and patient cooperative  Airway & Oxygen Therapy: Patient Spontanous Breathing and Patient connected to face mask oxygen  Post-op Assessment: Report given to RN and Post -op Vital signs reviewed and stable  Post vital signs: Reviewed and stable  Last Vitals:  Vitals Value Taken Time  BP 108/70 06/23/20 1236  Temp    Pulse    Resp 17 06/23/20 1238  SpO2    Vitals shown include unvalidated device data.  Last Pain:  Vitals:   06/23/20 1038  TempSrc:   PainSc: 0-No pain         Complications: No complications documented.

## 2020-06-23 NOTE — Interval H&P Note (Signed)
History and Physical Interval Note:  06/23/2020 11:14 AM  Ian Harrison  has presented today for surgery, with the diagnosis of GROSS HEMATURIA, POSSIBLE MASS.  The various methods of treatment have been discussed with the patient and family. After consideration of risks, benefits and other options for treatment, the patient has consented to  Procedure(s) with comments: TRANSURETHRAL RESECTION OF BLADDER TUMOR (TURBT) (N/A) - 90 MINS CYSTOSCOPY WITH CLOT EVACUATION (N/A) as a surgical intervention.  The patient's history has been reviewed, patient examined, no change in status, stable for surgery.  I have reviewed the patient's chart and labs.  Questions were answered to the patient's satisfaction.     Doyl Bitting D Davi Rotan

## 2020-06-23 NOTE — Discharge Instructions (Signed)

## 2020-06-23 NOTE — H&P (Signed)
CC/HPI: cc: gross hematuria   06/12/20: 86 year old man with no past urologic history presents to the ER 3 days ago with gross hematuria. He has continued to have gross hematuria since then. Urinalysis without signs of infection. CT scan shows 7 x 4 cm hyperdense structure at the bladder base. Patient is here with his wife today who provides most of the history. Patient has a history of CAD and MIs in the past. He is currently on aspirin and Plavix. The wife has not given him aspirin Plavix for the last 2 days.     ALLERGIES: Morphine    MEDICATIONS: Aspirin 81 mg tablet,chewable  Metoprolol Succinate 25 mg tablet, extended release 24 hr  Plavix 75 mg tablet     GU PSH: None   NON-GU PSH: None   GU PMH: None   NON-GU PMH: None   FAMILY HISTORY: None   SOCIAL HISTORY: Marital Status: Married Preferred Language: English; Ethnicity: Not Hispanic Or Latino; Race: White Current Smoking Status: Patient does not smoke anymore. Has not smoked since 05/25/2005.   Tobacco Use Assessment Completed: Used Tobacco in last 30 days? Has never drank.  Does not drink caffeine.    REVIEW OF SYSTEMS:    GU Review Male:   Patient reports frequent urination, hard to postpone urination, get up at night to urinate, and leakage of urine. Patient denies burning/ pain with urination, stream starts and stops, trouble starting your stream, have to strain to urinate , erection problems, and penile pain.  Gastrointestinal (Upper):   Patient denies nausea, vomiting, and indigestion/ heartburn.  Gastrointestinal (Lower):   Patient denies diarrhea and constipation.  Constitutional:   Patient denies fever, night sweats, weight loss, and fatigue.  Skin:   Patient denies skin rash/ lesion and itching.  Eyes:   Patient denies blurred vision and double vision.  Ears/ Nose/ Throat:   Patient denies sore throat and sinus problems.  Hematologic/Lymphatic:   Patient denies swollen glands and easy bruising.   Cardiovascular:   Patient denies leg swelling and chest pains.  Respiratory:   Patient denies cough and shortness of breath.  Endocrine:   Patient denies excessive thirst.  Musculoskeletal:   Patient denies back pain and joint pain.  Neurological:   Patient denies headaches and dizziness.  Psychologic:   Patient denies depression and anxiety.   VITAL SIGNS:      06/12/2020 09:00 AM  Weight 183 lb / 83.01 kg  Height 71 in / 180.34 cm  BP 104/65 mmHg  Pulse 71 /min  Temperature 96.9 F / 36.0 C  BMI 25.5 kg/m   MULTI-SYSTEM PHYSICAL EXAMINATION:    Constitutional: Well-nourished. No physical deformities. Normally developed. Good grooming.  Neck: Neck symmetrical, not swollen. Normal tracheal position.  Respiratory: No labored breathing, no use of accessory muscles.   Cardiovascular: Normal temperature  Skin: No paleness, no jaundice, no cyanosis. No lesion, no ulcer, no rash.  Neurologic / Psychiatric: Oriented to time, oriented to place, oriented to person. No depression, no anxiety, no agitation.  Gastrointestinal: No rigidity, non obese abdomen.   Eyes: Normal conjunctivae. Normal eyelids.  Ears, Nose, Mouth, and Throat: Left ear no scars, no lesions, no masses. Right ear no scars, no lesions, no masses. Nose no scars, no lesions, no masses. Normal hearing. Normal lips.  Musculoskeletal: Normal gait and station of head and neck.     Complexity of Data:  Records Review:   POC Tool  Urine Test Review:   Urinalysis  Urodynamics Review:  Review Bladder Scan  X-Ray Review: C.T. Abdomen/Pelvis: Reviewed Films. Reviewed Report. Discussed With Patient.    Notes:                     hemoglobin 12.3 on 06/09/2020  BUN 20, creatinine 0.96 on 06/08/2020   PROCEDURES:         Flexible Cystoscopy - 52000  Risks, benefits, and some of the potential complications of the procedure were discussed at length with the patient including infection, bleeding, voiding discomfort, urinary  retention, fever, chills, sepsis, and others. All questions were answered. Informed consent was obtained. Sterile technique and intraurethral analgesia were used.  Meatus:  Normal size. Normal location. Normal condition.  Urethra:  No strictures.  Prostate:  Obstructing. No hyperplasia.  Bladder:  Unable to clearly seen bladder due to patient's discomfort as well as hematuria. Was not completely bloody however debris floating and bladder obscured view. Unable to see mucosa or UOs.. Possible clot vs mass at bladder neck      The lower urinary tract was carefully examined. The procedure was well-tolerated and without complications. Antibiotic instructions were given. Instructions were given to call the office immediately for bloody urine, difficulty urinating, urinary retention, painful or frequent urination, fever, chills, nausea, vomiting or other illness. The patient stated that he understood these instructions and would comply with them.        PVR Ultrasound - 37169  Scanned Volume: 000 cc         Urinalysis w/Scope - 81001 Dipstick Dipstick Cont'd Micro  Color: Red Bilirubin: Invalid WBC/hpf: 0 - 5/hpf  Appearance: Turbid Ketones: Invalid RBC/hpf: Packed/hpf  Specific Gravity: Invalid Blood: Invalid Bacteria: Rare (0-9/hpf)  pH: Invalid Protein: Invalid Cystals: NS (Not Seen)  Glucose: Invalid Urobilinogen: Invalid Casts: NS (Not Seen)    Nitrites: Invalid Trichomonas: Not Present    Leukocyte Esterase: Invalid Mucous: Not Present      Epithelial Cells: 0 - 5/hpf      Yeast: NS (Not Seen)      Sperm: Not Present    Notes:  Microscopic not concentrated. Results not valid due to color interference.     ASSESSMENT:      ICD-10 Details  1 GU:   Gross hematuria - R31.0 Undiagnosed New Problem - Unable to see clearly on cystoscopy today. I recommended cystoscopy with clot evacuation and possible TURBT in the operating room. I have also recommend that patient hold aspirin and Plavix  for the next 2 days to see if this helps urination. I have offered the patient a Foley catheter to trying irrigate anything out of his bladder and help urination better. Patient is not interested in this. His PVR today in the office is 0. We discussed the risks and benefits of cystoscopy with possible clot evacuation and TURBT including but not limited to bleeding, infection, pain, need for catheter, need for future treatment, damage to surrounding structures. Patient has like to proceed. I have asked for clearance from PCP.

## 2020-06-23 NOTE — Op Note (Signed)
PATIENT:  Ian Harrison  PRE-OPERATIVE DIAGNOSIS: gross hematuria  POST-OPERATIVE DIAGNOSIS: gross hematuria, BPH, erythematous bladder mucosa  PROCEDURE:   Diagnostic cystoscopy, bladder biopsy, fulguration, removal of bladder calculus  SURGEON:  Jacalyn Lefevre, MD  ANESTHESIA:   General  EBL:  Minimal  DRAINS: Urethral catheter (20 Fr. coude Foley)   SPECIMEN:  Bladder biopsy  DISPOSITION OF SPECIMEN:  PATHOLOGY  FINDINGS: 1. Normal anterior urethra 2. Friable prostatic urethra with lateral lobe prostatic hypertrophy without median lobe 3. Orthotopic bilateral ureteral orifices effluxing clear urine 4. Severe bladder trabeculations with edema consistent with indwelling foley; scattered erythema of posterior bladder tumor 5. Small bladder calculus < 1cm  Indication:  86 yo man who presented with gross hematuria and also developed urinary retention 10 days ago here for diagnostic cystoscopy, possible clot evacuation, possible TURBT.  Description of operation: The patient was taken to the operating room and administered general anesthesia. They were then placed on the table and moved to the dorsal lithotomy position after which the genitalia was sterilely prepped and draped. An official timeout was then performed.  The 38 French resectoscope with the 30 lens and visual obturator were then passed into the bladder under direct visualization. Anterior urethra appeared normal. The visual obturator was then removed and the Gyrus resectoscope element with 30  lens was then inserted and the bladder was fully and systematically inspected. Ureteral orifices were noted to be in the normal anatomic positions.   There was no bladder mass seen.  Patient was noted to have severe bladder trabeculations and calculus that was easily removed.  A posterior wall bladder biopsy was taken from erythematous bladder mucosa and the abnormal mucosa was fulgurated.     Reinspection of the bladder did not  reveal any further abnormality.  I then removed the resectoscope. A 20 French Foley catheter was then inserted in the bladder and irrigated.  The patient was awakened and taken to the recovery room.   PLAN OF CARE: Discharge to home after PACU  PATIENT DISPOSITION:  PACU - hemodynamically stable.

## 2020-06-23 NOTE — Anesthesia Preprocedure Evaluation (Addendum)
Anesthesia Evaluation  Patient identified by MRN, date of birth, ID band Patient awake    Reviewed: Allergy & Precautions, NPO status , Patient's Chart, lab work & pertinent test results  Airway Mallampati: II  TM Distance: >3 FB     Dental   Pulmonary former smoker,    breath sounds clear to auscultation       Cardiovascular hypertension, (-) angina+ CAD and + Past MI   Rhythm:Regular Rate:Normal     Neuro/Psych PSYCHIATRIC DISORDERS Dementia    GI/Hepatic negative GI ROS, Neg liver ROS,   Endo/Other  diabetes  Renal/GU Renal disease     Musculoskeletal   Abdominal   Peds  Hematology   Anesthesia Other Findings   Reproductive/Obstetrics                             Anesthesia Physical Anesthesia Plan  ASA: III  Anesthesia Plan: General   Post-op Pain Management:    Induction: Intravenous  PONV Risk Score and Plan: 2 and Ondansetron and Propofol infusion  Airway Management Planned: LMA  Additional Equipment:   Intra-op Plan:   Post-operative Plan: Extubation in OR  Informed Consent: I have reviewed the patients History and Physical, chart, labs and discussed the procedure including the risks, benefits and alternatives for the proposed anesthesia with the patient or authorized representative who has indicated his/her understanding and acceptance.     Dental advisory given  Plan Discussed with: CRNA and Anesthesiologist  Anesthesia Plan Comments:        Anesthesia Quick Evaluation

## 2020-06-23 NOTE — Anesthesia Postprocedure Evaluation (Signed)
Anesthesia Post Note  Patient: Ian Harrison  Procedure(s) Performed: TRANSURETHRAL RESECTION OF BLADDER TUMOR (TURBT) (N/A ) CYSTOSCOPY WITH CLOT EVACUATION (N/A )     Patient location during evaluation: PACU Anesthesia Type: General Level of consciousness: awake Pain management: pain level controlled Respiratory status: spontaneous breathing Cardiovascular status: stable Postop Assessment: no apparent nausea or vomiting Anesthetic complications: no   No complications documented.  Last Vitals:  Vitals:   06/23/20 1300 06/23/20 1315  BP: (!) 113/41 (!) 126/51  Pulse: 60 65  Resp: 12 16  Temp:    SpO2: 96% 96%    Last Pain:  Vitals:   06/23/20 1236  TempSrc:   PainSc: 0-No pain                 Clariza Sickman

## 2020-06-24 ENCOUNTER — Encounter (HOSPITAL_COMMUNITY): Payer: Self-pay | Admitting: Urology

## 2020-06-25 LAB — SURGICAL PATHOLOGY

## 2020-07-01 ENCOUNTER — Other Ambulatory Visit (HOSPITAL_BASED_OUTPATIENT_CLINIC_OR_DEPARTMENT_OTHER): Payer: Self-pay | Admitting: Urology

## 2020-07-01 MED FILL — TAMSULOSIN HCL 0.4 MG CAP: 0.4 | 30 days supply | Qty: 30 | Fill #0

## 2020-07-09 ENCOUNTER — Encounter (HOSPITAL_COMMUNITY): Payer: Self-pay

## 2020-07-09 ENCOUNTER — Other Ambulatory Visit: Payer: Self-pay

## 2020-07-09 ENCOUNTER — Inpatient Hospital Stay (HOSPITAL_COMMUNITY)
Admission: EM | Admit: 2020-07-09 | Discharge: 2020-07-16 | DRG: 698 | Disposition: A | Discharge status: Home Health | Payer: Medicare Other | Attending: Family Medicine | Admitting: Family Medicine

## 2020-07-09 ENCOUNTER — Emergency Department (HOSPITAL_COMMUNITY): Payer: Medicare Other

## 2020-07-09 DIAGNOSIS — I5032 Chronic diastolic (congestive) heart failure: Secondary | ICD-10-CM | POA: Diagnosis present

## 2020-07-09 DIAGNOSIS — R7989 Other specified abnormal findings of blood chemistry: Secondary | ICD-10-CM

## 2020-07-09 DIAGNOSIS — N3001 Acute cystitis with hematuria: Secondary | ICD-10-CM | POA: Diagnosis present

## 2020-07-09 DIAGNOSIS — I21A1 Myocardial infarction type 2: Secondary | ICD-10-CM | POA: Diagnosis present

## 2020-07-09 DIAGNOSIS — Z7984 Long term (current) use of oral hypoglycemic drugs: Secondary | ICD-10-CM

## 2020-07-09 DIAGNOSIS — N3000 Acute cystitis without hematuria: Secondary | ICD-10-CM

## 2020-07-09 DIAGNOSIS — E875 Hyperkalemia: Secondary | ICD-10-CM | POA: Diagnosis present

## 2020-07-09 DIAGNOSIS — E119 Type 2 diabetes mellitus without complications: Secondary | ICD-10-CM

## 2020-07-09 DIAGNOSIS — R778 Other specified abnormalities of plasma proteins: Secondary | ICD-10-CM

## 2020-07-09 DIAGNOSIS — N390 Urinary tract infection, site not specified: Secondary | ICD-10-CM | POA: Diagnosis present

## 2020-07-09 DIAGNOSIS — F0390 Unspecified dementia without behavioral disturbance: Secondary | ICD-10-CM | POA: Diagnosis present

## 2020-07-09 DIAGNOSIS — A419 Sepsis, unspecified organism: Secondary | ICD-10-CM | POA: Diagnosis present

## 2020-07-09 DIAGNOSIS — D631 Anemia in chronic kidney disease: Secondary | ICD-10-CM | POA: Diagnosis present

## 2020-07-09 DIAGNOSIS — E1122 Type 2 diabetes mellitus with diabetic chronic kidney disease: Secondary | ICD-10-CM | POA: Diagnosis present

## 2020-07-09 DIAGNOSIS — Z955 Presence of coronary angioplasty implant and graft: Secondary | ICD-10-CM

## 2020-07-09 DIAGNOSIS — Z87891 Personal history of nicotine dependence: Secondary | ICD-10-CM

## 2020-07-09 DIAGNOSIS — R079 Chest pain, unspecified: Secondary | ICD-10-CM | POA: Diagnosis present

## 2020-07-09 DIAGNOSIS — N189 Chronic kidney disease, unspecified: Secondary | ICD-10-CM | POA: Diagnosis present

## 2020-07-09 DIAGNOSIS — Z7902 Long term (current) use of antithrombotics/antiplatelets: Secondary | ICD-10-CM

## 2020-07-09 DIAGNOSIS — Z85828 Personal history of other malignant neoplasm of skin: Secondary | ICD-10-CM

## 2020-07-09 DIAGNOSIS — T83518A Infection and inflammatory reaction due to other urinary catheter, initial encounter: Secondary | ICD-10-CM | POA: Diagnosis not present

## 2020-07-09 DIAGNOSIS — I252 Old myocardial infarction: Secondary | ICD-10-CM

## 2020-07-09 DIAGNOSIS — I10 Essential (primary) hypertension: Secondary | ICD-10-CM | POA: Diagnosis present

## 2020-07-09 DIAGNOSIS — N183 Chronic kidney disease, stage 3 unspecified: Secondary | ICD-10-CM | POA: Diagnosis present

## 2020-07-09 DIAGNOSIS — N179 Acute kidney failure, unspecified: Secondary | ICD-10-CM | POA: Diagnosis present

## 2020-07-09 DIAGNOSIS — I714 Abdominal aortic aneurysm, without rupture, unspecified: Secondary | ICD-10-CM | POA: Diagnosis present

## 2020-07-09 DIAGNOSIS — R6520 Severe sepsis without septic shock: Secondary | ICD-10-CM | POA: Diagnosis present

## 2020-07-09 DIAGNOSIS — Y846 Urinary catheterization as the cause of abnormal reaction of the patient, or of later complication, without mention of misadventure at the time of the procedure: Secondary | ICD-10-CM | POA: Diagnosis present

## 2020-07-09 DIAGNOSIS — Z20822 Contact with and (suspected) exposure to covid-19: Secondary | ICD-10-CM | POA: Diagnosis present

## 2020-07-09 DIAGNOSIS — I130 Hypertensive heart and chronic kidney disease with heart failure and stage 1 through stage 4 chronic kidney disease, or unspecified chronic kidney disease: Secondary | ICD-10-CM | POA: Diagnosis present

## 2020-07-09 DIAGNOSIS — E785 Hyperlipidemia, unspecified: Secondary | ICD-10-CM | POA: Diagnosis present

## 2020-07-09 DIAGNOSIS — D6959 Other secondary thrombocytopenia: Secondary | ICD-10-CM | POA: Diagnosis present

## 2020-07-09 DIAGNOSIS — R531 Weakness: Secondary | ICD-10-CM

## 2020-07-09 DIAGNOSIS — Z7982 Long term (current) use of aspirin: Secondary | ICD-10-CM

## 2020-07-09 DIAGNOSIS — I2510 Atherosclerotic heart disease of native coronary artery without angina pectoris: Secondary | ICD-10-CM | POA: Diagnosis present

## 2020-07-09 MED FILL — CEPHALEXIN 500 MG CAPSULE: 500 | 10 days supply | Qty: 20 | Fill #0

## 2020-07-09 NOTE — ED Triage Notes (Signed)
Pt arrived via EMS, from home, recent UTI,  Been on abx x3 days, increasing generalized weakness since.

## 2020-07-10 ENCOUNTER — Observation Stay (HOSPITAL_COMMUNITY): Payer: Medicare Other

## 2020-07-10 ENCOUNTER — Encounter (HOSPITAL_COMMUNITY): Payer: Self-pay | Admitting: Emergency Medicine

## 2020-07-10 DIAGNOSIS — I25118 Atherosclerotic heart disease of native coronary artery with other forms of angina pectoris: Secondary | ICD-10-CM

## 2020-07-10 DIAGNOSIS — N189 Chronic kidney disease, unspecified: Secondary | ICD-10-CM | POA: Diagnosis present

## 2020-07-10 DIAGNOSIS — E875 Hyperkalemia: Secondary | ICD-10-CM | POA: Diagnosis not present

## 2020-07-10 DIAGNOSIS — I714 Abdominal aortic aneurysm, without rupture: Secondary | ICD-10-CM | POA: Diagnosis not present

## 2020-07-10 DIAGNOSIS — E119 Type 2 diabetes mellitus without complications: Secondary | ICD-10-CM | POA: Diagnosis not present

## 2020-07-10 DIAGNOSIS — R531 Weakness: Secondary | ICD-10-CM

## 2020-07-10 DIAGNOSIS — I248 Other forms of acute ischemic heart disease: Secondary | ICD-10-CM

## 2020-07-10 DIAGNOSIS — I5032 Chronic diastolic (congestive) heart failure: Secondary | ICD-10-CM | POA: Diagnosis present

## 2020-07-10 DIAGNOSIS — R079 Chest pain, unspecified: Secondary | ICD-10-CM | POA: Diagnosis present

## 2020-07-10 DIAGNOSIS — F0390 Unspecified dementia without behavioral disturbance: Secondary | ICD-10-CM

## 2020-07-10 DIAGNOSIS — N390 Urinary tract infection, site not specified: Secondary | ICD-10-CM | POA: Diagnosis present

## 2020-07-10 HISTORY — DX: Chronic diastolic (congestive) heart failure: I50.32

## 2020-07-10 HISTORY — DX: Hyperkalemia: E87.5

## 2020-07-10 HISTORY — DX: Urinary tract infection, site not specified: N39.0

## 2020-07-10 HISTORY — DX: Chest pain, unspecified: R07.9

## 2020-07-10 HISTORY — DX: Weakness: R53.1

## 2020-07-10 LAB — COMPREHENSIVE METABOLIC PANEL
ALT: 13 U/L (ref 0–44)
ALT: 14 U/L (ref 0–44)
AST: 28 U/L (ref 15–41)
AST: 16 U/L (ref 15–41)
Albumin: 4 g/dL (ref 3.5–5.0)
Albumin: 3.4 g/dL — ABNORMAL LOW (ref 3.5–5.0)
Alkaline Phosphatase: 45 U/L (ref 38–126)
Alkaline Phosphatase: 39 U/L (ref 38–126)
Anion gap: 10 (ref 5–15)
Anion gap: 9 (ref 5–15)
BUN: 22 mg/dL (ref 8–23)
BUN: 22 mg/dL (ref 8–23)
CO2: 23 mmol/L (ref 22–32)
CO2: 23 mmol/L (ref 22–32)
Calcium: 8.9 mg/dL (ref 8.9–10.3)
Calcium: 8.5 mg/dL — ABNORMAL LOW (ref 8.9–10.3)
Chloride: 100 mmol/L (ref 98–111)
Chloride: 103 mmol/L (ref 98–111)
Creatinine, Ser: 1.03 mg/dL (ref 0.61–1.24)
Creatinine, Ser: 0.96 mg/dL (ref 0.61–1.24)
GFR calc Af Amer: >60 mL/min (ref >60)
GFR calc Af Amer: >60 mL/min (ref >60)
GFR calc non Af Amer: >60 mL/min (ref >60)
GFR calc non Af Amer: >60 mL/min (ref >60)
Glucose, Bld: 135 mg/dL — ABNORMAL HIGH (ref 70–99)
Glucose, Bld: 156 mg/dL — ABNORMAL HIGH (ref 70–99)
Potassium: 6.3 mmol/L (ref 3.5–5.1)
Potassium: 3.8 mmol/L (ref 3.5–5.1)
Sodium: 133 mmol/L — ABNORMAL LOW (ref 135–145)
Sodium: 135 mmol/L (ref 135–145)
Total Bilirubin: 2 mg/dL — ABNORMAL HIGH (ref 0.3–1.2)
Total Bilirubin: 1.4 mg/dL — ABNORMAL HIGH (ref 0.3–1.2)
Total Protein: 7.7 g/dL (ref 6.5–8.1)
Total Protein: 6.5 g/dL (ref 6.5–8.1)

## 2020-07-10 LAB — CBC WITH DIFFERENTIAL/PLATELET
Abs Immature Granulocytes: 0.44 10*3/uL — ABNORMAL HIGH (ref 0.00–0.07)
Abs Immature Granulocytes: 0.5 10*3/uL — ABNORMAL HIGH (ref 0.00–0.07)
Basophils Absolute: 0.1 10*3/uL (ref 0.0–0.1)
Basophils Absolute: 0.1 10*3/uL (ref 0.0–0.1)
Basophils Relative: 0 %
Basophils Relative: 0 %
Eosinophils Absolute: 0 10*3/uL (ref 0.0–0.5)
Eosinophils Absolute: 0 10*3/uL (ref 0.0–0.5)
Eosinophils Relative: 0 %
Eosinophils Relative: 0 %
HCT: 35.8 % — ABNORMAL LOW (ref 39.0–52.0)
HCT: 31.4 % — ABNORMAL LOW (ref 39.0–52.0)
Hemoglobin: 11.4 g/dL — ABNORMAL LOW (ref 13.0–17.0)
Hemoglobin: 10.5 g/dL — ABNORMAL LOW (ref 13.0–17.0)
Immature Granulocytes: 2 %
Immature Granulocytes: 2 %
Lymphocytes Relative: 8 %
Lymphocytes Relative: 5 %
Lymphs Abs: 1.8 10*3/uL (ref 0.7–4.0)
Lymphs Abs: 1.2 10*3/uL (ref 0.7–4.0)
MCH: 32.9 pg (ref 26.0–34.0)
MCH: 33.5 pg (ref 26.0–34.0)
MCHC: 31.8 g/dL (ref 30.0–36.0)
MCHC: 33.4 g/dL (ref 30.0–36.0)
MCV: 103.5 fL — ABNORMAL HIGH (ref 80.0–100.0)
MCV: 100.3 fL — ABNORMAL HIGH (ref 80.0–100.0)
Monocytes Absolute: 1.2 10*3/uL — ABNORMAL HIGH (ref 0.1–1.0)
Monocytes Absolute: 1.3 10*3/uL — ABNORMAL HIGH (ref 0.1–1.0)
Monocytes Relative: 5 %
Monocytes Relative: 6 %
Neutro Abs: 18.7 10*3/uL — ABNORMAL HIGH (ref 1.7–7.7)
Neutro Abs: 20.3 10*3/uL — ABNORMAL HIGH (ref 1.7–7.7)
Neutrophils Relative %: 85 %
Neutrophils Relative %: 87 %
Platelets: 188 10*3/uL (ref 150–400)
Platelets: 162 10*3/uL (ref 150–400)
RBC: 3.46 MIL/uL — ABNORMAL LOW (ref 4.22–5.81)
RBC: 3.13 MIL/uL — ABNORMAL LOW (ref 4.22–5.81)
RDW: 14.2 % (ref 11.5–15.5)
RDW: 14.3 % (ref 11.5–15.5)
WBC: 22.2 10*3/uL — ABNORMAL HIGH (ref 4.0–10.5)
WBC: 23.3 10*3/uL — ABNORMAL HIGH (ref 4.0–10.5)
nRBC: 0 % (ref 0.0–0.2)
nRBC: 0.1 % (ref 0.0–0.2)

## 2020-07-10 LAB — URINALYSIS, ROUTINE W REFLEX MICROSCOPIC
Bilirubin Urine: NEGATIVE
Glucose, UA: NEGATIVE mg/dL
Ketones, ur: NEGATIVE mg/dL
Nitrite: POSITIVE — AB
Protein, ur: 100 mg/dL — AB
RBC / HPF: >50 RBC/hpf — ABNORMAL HIGH (ref 0–5)
Specific Gravity, Urine: 1.021 (ref 1.005–1.030)
WBC, UA: >50 WBC/hpf — ABNORMAL HIGH (ref 0–5)
pH: 5 (ref 5.0–8.0)

## 2020-07-10 LAB — PROCALCITONIN: Procalcitonin: 0.15 ng/mL

## 2020-07-10 LAB — I-STAT CHEM 8, ED
BUN: 28 mg/dL — ABNORMAL HIGH (ref 8–23)
Calcium, Ion: 1.14 mmol/L — ABNORMAL LOW (ref 1.15–1.40)
Chloride: 102 mmol/L (ref 98–111)
Creatinine, Ser: 1 mg/dL (ref 0.61–1.24)
Glucose, Bld: 134 mg/dL — ABNORMAL HIGH (ref 70–99)
HCT: 36 % — ABNORMAL LOW (ref 39.0–52.0)
Hemoglobin: 12.2 g/dL — ABNORMAL LOW (ref 13.0–17.0)
Potassium: 6.5 mmol/L (ref 3.5–5.1)
Sodium: 135 mmol/L (ref 135–145)
TCO2: 27 mmol/L (ref 22–32)

## 2020-07-10 LAB — TROPONIN I (HIGH SENSITIVITY)
Troponin I (High Sensitivity): 12 ng/L (ref <18)
Troponin I (High Sensitivity): 12 ng/L (ref <18)
Troponin I (High Sensitivity): 297 ng/L (ref <18)
Troponin I (High Sensitivity): 2938 ng/L (ref <18)

## 2020-07-10 LAB — URINE CULTURE: Special Requests: NORMAL

## 2020-07-10 LAB — SARS CORONAVIRUS 2 BY RT PCR (HOSPITAL ORDER, PERFORMED IN ~~LOC~~ HOSPITAL LAB): SARS Coronavirus 2: NEGATIVE

## 2020-07-10 LAB — BRAIN NATRIURETIC PEPTIDE: B Natriuretic Peptide: 616.4 pg/mL — ABNORMAL HIGH (ref 0.0–100.0)

## 2020-07-10 LAB — CBG MONITORING, ED: Glucose-Capillary: 178 mg/dL — ABNORMAL HIGH (ref 70–99)

## 2020-07-10 LAB — LACTIC ACID, PLASMA: Lactic Acid, Venous: 2.2 mmol/L (ref 0.5–1.9)

## 2020-07-10 MED ORDER — SODIUM CHLORIDE 0.9 % IV SOLN
1.0000 g | INTRAVENOUS | Status: DC
  Administered 2020-07-11 – 2020-07-15 (×5): 1 g via INTRAVENOUS
  Filled 2020-07-10 (×4): qty 1
  Filled 2020-07-10: qty 10

## 2020-07-10 MED ORDER — METOPROLOL SUCCINATE ER 25 MG PO TB24
25.0000 mg | ORAL_TABLET | Freq: Every day | ORAL | Status: DC
  Filled 2020-07-10: qty 1

## 2020-07-10 MED ORDER — ISOSORBIDE MONONITRATE ER 30 MG PO TB24
15.0000 mg | ORAL_TABLET | Freq: Every day | ORAL | Status: DC
  Administered 2020-07-10 – 2020-07-16 (×6): 15 mg via ORAL
  Filled 2020-07-10 (×6): qty 1

## 2020-07-10 MED ORDER — DEXTROSE 50 % IV SOLN
1.0000 | Freq: Once | INTRAVENOUS | Status: AC
  Administered 2020-07-10: 50 mL via INTRAVENOUS
  Filled 2020-07-10: qty 50

## 2020-07-10 MED ORDER — ASPIRIN EC 81 MG PO TBEC
81.0000 mg | DELAYED_RELEASE_TABLET | Freq: Every day | ORAL | Status: DC
  Administered 2020-07-10 – 2020-07-13 (×4): 81 mg via ORAL
  Filled 2020-07-10 (×4): qty 1

## 2020-07-10 MED ORDER — INSULIN ASPART 100 UNIT/ML IV SOLN
5.0000 [IU] | Freq: Once | INTRAVENOUS | Status: AC
  Administered 2020-07-10: 5 [IU] via INTRAVENOUS
  Filled 2020-07-10: qty 0.05

## 2020-07-10 MED ORDER — SODIUM CHLORIDE 0.9 % IV SOLN
1.0000 g | Freq: Once | INTRAVENOUS | Status: AC
  Administered 2020-07-10: 1 g via INTRAVENOUS
  Filled 2020-07-10: qty 10

## 2020-07-10 MED ORDER — METOPROLOL SUCCINATE ER 25 MG PO TB24
12.5000 mg | ORAL_TABLET | Freq: Every day | ORAL | Status: DC
  Administered 2020-07-10 – 2020-07-16 (×6): 12.5 mg via ORAL
  Filled 2020-07-10: qty 0.5
  Filled 2020-07-10 (×2): qty 1
  Filled 2020-07-10: qty 0.5
  Filled 2020-07-10 (×2): qty 1

## 2020-07-10 MED ORDER — NITROGLYCERIN 0.4 MG SL SUBL
0.4000 mg | SUBLINGUAL_TABLET | SUBLINGUAL | Status: DC | PRN
  Administered 2020-07-10 – 2020-07-14 (×3): 0.4 mg via SUBLINGUAL
  Filled 2020-07-10 (×3): qty 1

## 2020-07-10 MED ORDER — ALBUTEROL SULFATE HFA 108 (90 BASE) MCG/ACT IN AERS
8.0000 | INHALATION_SPRAY | Freq: Once | RESPIRATORY_TRACT | Status: AC
  Administered 2020-07-10: 8 via RESPIRATORY_TRACT
  Filled 2020-07-10: qty 6.7

## 2020-07-10 MED ORDER — SODIUM BICARBONATE 8.4 % IV SOLN
50.0000 meq | Freq: Once | INTRAVENOUS | Status: AC
  Administered 2020-07-10: 50 meq via INTRAVENOUS
  Filled 2020-07-10: qty 50

## 2020-07-10 MED ORDER — ENOXAPARIN SODIUM 40 MG/0.4ML ~~LOC~~ SOLN
40.0000 mg | SUBCUTANEOUS | Status: DC
  Administered 2020-07-10 – 2020-07-13 (×4): 40 mg via SUBCUTANEOUS
  Filled 2020-07-10 (×4): qty 0.4

## 2020-07-10 MED ORDER — SODIUM CHLORIDE 0.9 % IV SOLN: INTRAVENOUS | Status: DC

## 2020-07-10 MED ORDER — SODIUM CHLORIDE 0.9 % IV BOLUS
500.0000 mL | Freq: Once | INTRAVENOUS | Status: AC
  Administered 2020-07-10: 500 mL via INTRAVENOUS

## 2020-07-10 MED ORDER — SODIUM ZIRCONIUM CYCLOSILICATE 10 G PO PACK
10.0000 g | PACK | Freq: Once | ORAL | Status: AC
  Administered 2020-07-10: 10 g via ORAL
  Filled 2020-07-10: qty 1

## 2020-07-10 MED ORDER — CLOPIDOGREL BISULFATE 75 MG PO TABS
75.0000 mg | ORAL_TABLET | Freq: Every day | ORAL | Status: DC
  Administered 2020-07-10 – 2020-07-16 (×7): 75 mg via ORAL
  Filled 2020-07-10 (×8): qty 1

## 2020-07-10 MED ORDER — TAMSULOSIN HCL 0.4 MG PO CAPS
0.4000 mg | ORAL_CAPSULE | Freq: Every day | ORAL | Status: DC
  Administered 2020-07-10 – 2020-07-15 (×6): 0.4 mg via ORAL
  Filled 2020-07-10 (×6): qty 1

## 2020-07-10 MED ORDER — ONDANSETRON HCL 4 MG PO TABS: 4.0000 mg | ORAL_TABLET | Freq: Four times a day (QID) | ORAL | Status: DC | PRN

## 2020-07-10 MED ORDER — ONDANSETRON HCL 4 MG/2ML IJ SOLN: 4.0000 mg | Freq: Four times a day (QID) | INTRAMUSCULAR | Status: DC | PRN

## 2020-07-10 NOTE — ED Notes (Signed)
Cardiologist at bedside.  

## 2020-07-10 NOTE — Progress Notes (Signed)
VAST consulted to place an IV specific for drawing labs. Spoke with pt's nurse; pt has 2 working PIV's. Educated about lab draw vs more invasive IV placement; further explained that lab should be contacted for lab draws when ER staff are unsuccessful. ER RN verbalized understanding.

## 2020-07-10 NOTE — ED Notes (Signed)
Dr. Oneta Rack made aware of pt's troponin.  An EKG was obtained.  Pt denies any SOB or chest pain.  VSS. Will continue to monitor.  M. Sharlet Salina APP made aware of pt's condition too.  EKG order placed.

## 2020-07-10 NOTE — H&P (Signed)
History and Physical    Ian Harrison WEX:[id number] DOB: [birth year] DOA: 07/09/2020  PCP: Mackie Pai, PA-C   Patient coming from: Home.  Chief Complaint: Weakness.  HPI: Ian Harrison is a 86 y.o. male with history of CAD status post stenting, dementia, hypertension, aortic aneurysm who was recently followed up by urologist for hematuria and urinary retention underwent cystoscopy on June 23, 2020 following which patient's catheter was removed.  Patient had followed up with urologist yesterday and had been empirically started on antibiotic for possible UTI.  Patient also was thought how to self cath but had not done it because patient was able to urinate without any help.  At home patient feels suddenly having rigors and felt weak following which patient found it difficult to walk.  ED Course: In the ER patient did not have any focal weakness.  UA is consistent with UTI.  Labs show significant leukocytosis of 22.2 potassium of 6.3 creatinine was normal.  Covid test was negative.  While in the ER patient started having chest pressure off and on.  EKG shows slightly T wave and ST depression.  Chest x-ray was unremarkable Covid test is negative troponins are negative.  Patient started on empiric antibiotics and admitted for UTI generalized weakness and chest pain.  Patient also was given D50 insulin and sodium bicarb of Lokelma for hyperkalemia.  Review of Systems: As per HPI, rest all negative.   Past Medical History:  Diagnosis Date  . Aortic aneurysm ([place])   . CAD (coronary artery disease)   . Cancer [facility])    Skin cancer  . Chronic kidney disease   . Dementia ([place])   . Diabetes mellitus type II, non insulin dependent ([place])   . Dyslipidemia    Intolerance to atorvastatin due to myalgias  . Myocardial infarction ([place])    15 years ago  . Vasovagal syncope    Without recurrence    Past Surgical History:  Procedure Laterality Date  . ABDOMINAL AORTIC ANEURYSM REPAIR    .  APPENDECTOMY    . CATARACT EXTRACTION W/ INTRAOCULAR LENS IMPLANT    . CYSTOSCOPY WITH FULGERATION N/A 06/23/2020   Procedure: CYSTOSCOPY WITH CLOT EVACUATION;  Surgeon: Robley Fries, MD;  Location: [facility];  Service: Urology;  Laterality: N/A;  . HERNIA REPAIR    . TRANSURETHRAL RESECTION OF BLADDER TUMOR N/A 06/23/2020   Procedure: TRANSURETHRAL RESECTION OF BLADDER TUMOR (TURBT);  Surgeon: Robley Fries, MD;  Location: [facility];  Service: Urology;  Laterality: N/A;  90 MINS  . WISDOM TOOTH EXTRACTION       reports that he has quit smoking. He has never used smokeless tobacco. He reports previous alcohol use. He reports that he does not use drugs.  Allergies  Allergen Reactions  . Atorvastatin Other (See Comments)    myalgia  . Morphine And Related Other (See Comments)    Mood changes (anxious)    Family History  Problem Relation Age of Onset  . Heart attack Brother     Prior to Admission medications   Medication Sig Start Date End Date Taking? Authorizing Provider  aspirin EC 81 MG tablet Take 81 mg by mouth daily with breakfast.    Yes [provider]  cephALEXin (KEFLEX) 500 MG capsule Take 500 mg by mouth 2 (two) times daily.   Yes [provider]  clopidogrel (PLAVIX) 75 MG tablet Take 1 tablet (75 mg total) by mouth daily. Patient taking differently: Take 75 mg by mouth daily  with breakfast.  03/05/20  Yes Tobb, Kardie, DO  metoprolol succinate (TOPROL-XL) 25 MG 24 hr tablet Take 1 tablet (25 mg total) by mouth daily. Patient taking differently: Take 25 mg by mouth daily with breakfast.  03/05/20  Yes Tobb, Kardie, DO  tamsulosin (FLOMAX) 0.4 MG CAPS capsule Take 0.4 mg by mouth at bedtime. 07/01/20  Yes [provider]    Physical Exam: Constitutional: Moderately built and nourished. Vitals:   07/10/20 0100 07/10/20 0115 07/10/20 0130 07/10/20 0145  BP: (!) 139/58 137/61 137/62 133/63  Pulse: 84  76 80  Resp: (!) 22 16 20 15   Temp:        TempSrc:      SpO2: 95%  94% 98%   Eyes: Anicteric no pallor. ENMT: No discharge from the ears eyes nose or mouth. Neck: No mass felt.  No neck rigidity. Respiratory: No rhonchi or crepitations. Cardiovascular: S1-S2 heard. Abdomen: Soft nontender bowel sounds present. Musculoskeletal: No edema. Skin: No rash. Neurologic: Alert awake oriented to his name and place moves all extremities. Psychiatric: Appears normal.  Normal affect.   Labs on Admission: I have personally reviewed following labs and imaging studies  CBC: Recent Labs  Lab 07/09/20 2306 07/10/20 0012  WBC 22.2*  --   NEUTROABS 18.7*  --   HGB 11.4* 12.2*  HCT 35.8* 36.0*  MCV 103.5*  --   PLT 188  --    Basic Metabolic Panel: Recent Labs  Lab 07/09/20 2306 07/10/20 0012  NA 133* 135  K 6.3* 6.5*  CL 100 102  CO2 23  --   GLUCOSE 135* 134*  BUN 22 28*  CREATININE 1.03 1.00  CALCIUM 8.9  --    GFR: CrCl cannot be calculated (Unknown ideal weight.). Liver Function Tests: Recent Labs  Lab 07/09/20 2306  AST 28  ALT 13  ALKPHOS 45  BILITOT 2.0*  PROT 7.7  ALBUMIN 4.0   No results for input(s): LIPASE, AMYLASE in the last 168 hours. No results for input(s): AMMONIA in the last 168 hours. Coagulation Profile: No results for input(s): INR, PROTIME in the last 168 hours. Cardiac Enzymes: No results for input(s): CKTOTAL, CKMB, CKMBINDEX, TROPONINI in the last 168 hours. BNP (last 3 results) No results for input(s): PROBNP in the last 8760 hours. HbA1C: No results for input(s): HGBA1C in the last 72 hours. CBG: Recent Labs  Lab 07/10/20 0258  GLUCAP 178*   Lipid Profile: No results for input(s): CHOL, HDL, LDLCALC, TRIG, CHOLHDL, LDLDIRECT in the last 72 hours. Thyroid Function Tests: No results for input(s): TSH, T4TOTAL, FREET4, T3FREE, THYROIDAB in the last 72 hours. Anemia Panel: No results for input(s): VITAMINB12, FOLATE, FERRITIN, TIBC, IRON, RETICCTPCT in the last 72  hours. Urine analysis:    Component Value Date/Time   COLORURINE YELLOW 07/09/2020 2306   APPEARANCEUR TURBID (A) 07/09/2020 2306   LABSPEC 1.021 07/09/2020 2306   PHURINE 5.0 07/09/2020 2306   GLUCOSEU NEGATIVE 07/09/2020 2306   HGBUR LARGE (A) 07/09/2020 2306   BILIRUBINUR NEGATIVE 07/09/2020 2306   KETONESUR NEGATIVE 07/09/2020 2306   PROTEINUR 100 (A) 07/09/2020 2306   NITRITE POSITIVE (A) 07/09/2020 2306   LEUKOCYTESUR LARGE (A) 07/09/2020 2306   Sepsis Labs: @LABRCNTIP (procalcitonin:4,lacticidven:4) ) Recent Results (from the past 240 hour(s))  SARS Coronavirus 2 by RT PCR (hospital order, performed in [place] hospital lab) Nasopharyngeal Nasopharyngeal Swab     Status: None   Collection Time: 07/09/20 11:07 PM   Specimen: Nasopharyngeal Swab  Result  Value Ref Range Status   SARS Coronavirus 2 NEGATIVE NEGATIVE Final    Comment: (NOTE) SARS-CoV-2 target nucleic acids are NOT DETECTED.  The SARS-CoV-2 RNA is generally detectable in upper and lower respiratory specimens during the acute phase of infection. The lowest concentration of SARS-CoV-2 viral copies this assay can detect is 250 copies / mL. A negative result does not preclude SARS-CoV-2 infection and should not be used as the sole basis for treatment or other patient management decisions.  A negative result may occur with improper specimen collection / handling, submission of specimen other than nasopharyngeal swab, presence of viral mutation(s) within the areas targeted by this assay, and inadequate number of viral copies (<250 copies / mL). A negative result must be combined with clinical observations, patient history, and epidemiological information.  Fact Sheet for Patients:   StrictlyIdeas.no  Fact Sheet for Healthcare Providers: BankingDealers.co.za  This test is not yet approved or  cleared by the [place] FDA and has been authorized for detection  and/or diagnosis of SARS-CoV-2 by FDA under an Emergency Use Authorization (EUA).  This EUA will remain in effect (meaning this test can be used) for the duration of the COVID-19 declaration under Section 564(b)(1) of the Act, 21 U.S.C. section 360bbb-3(b)(1), unless the authorization is terminated or revoked sooner.  Performed at [facility], [place] [address] [place], [place] [address]      Radiological Exams on Admission: DG Chest Portable 1 View  Result Date: 07/09/2020 CLINICAL DATA:  Weakness, urinary tract infection EXAM: PORTABLE CHEST 1 VIEW COMPARISON:  06/09/2020 FINDINGS: Single frontal view of the chest demonstrates an unremarkable cardiac silhouette. No airspace disease, effusion, or pneumothorax. No acute bony abnormalities. IMPRESSION: 1. No acute intrathoracic process. Electronically Signed   By: Randa Ngo M.D.   On: 07/09/2020 23:45   CT Renal Stone Study  Result Date: 07/10/2020 CLINICAL DATA:  Urinary tract infection EXAM: CT ABDOMEN AND PELVIS WITHOUT CONTRAST TECHNIQUE: Multidetector CT imaging of the abdomen and pelvis was performed following the standard protocol without IV contrast. COMPARISON:  None. FINDINGS: LOWER CHEST: Normal. HEPATOBILIARY: Normal hepatic contours. No intra- or extrahepatic biliary dilatation. There is cholelithiasis without acute inflammation. PANCREAS: Normal pancreas. No ductal dilatation or peripancreatic fluid collection. SPLEEN: Normal. ADRENALS/URINARY TRACT: The adrenal glands are normal. No hydronephrosis, nephroureterolithiasis or solid renal mass. There are 2 calcifications in the dependent portion of the urinary bladder, each measuring approximately 2 mm. STOMACH/BOWEL: There is no hiatal hernia. Normal duodenal course and caliber. No small bowel dilatation or inflammation. No focal colonic abnormality. The appendix is not visualized. No right lower quadrant inflammation or free fluid. VASCULAR/LYMPHATIC: There is  calcific atherosclerosis of the abdominal aorta. No lymphadenopathy. REPRODUCTIVE: Enlarged prostate measures 5.5 cm in transverse dimension. MUSCULOSKELETAL. No bony spinal canal stenosis or focal osseous abnormality. OTHER: None. IMPRESSION: 1. No obstructive uropathy or nephroureterolithiasis. Two calcifications in the dependent portion of the urinary bladder may be sequela of recently resected bladder tumor. 2. Cholelithiasis without acute inflammation. 3. Enlarged prostate. Aortic Atherosclerosis (ICD10-I70.0). Electronically Signed   By: Ulyses Jarred M.D.   On: 07/10/2020 02:32    EKG: Independently reviewed.  Normal sinus rhythm with ST depression in the lateral leads.  Assessment/Plan Active Problems:   Coronary artery disease involving native coronary artery of native heart without angina pectoris   Essential hypertension   AAA (abdominal aortic aneurysm) without rupture (HCC)   Dementia without behavioral disturbance (HCC)   Generalized weakness   Acute  lower UTI   Chest pain   Hyperkalemia    1. Generalized weakness with UTI -follow blood cultures and urine cultures.  On ceftriaxone at this time.  Get physical therapy consult.  Appears nonfocal. 2. Chest pain with history of CAD status post stenting -patient has been having chest pain off and on in the ER.  We will keep patient on as needed nitroglycerin sublingual trend cardiac markers continue aspirin Plavix and metoprolol.  Consult cardiology. 3. Hyperkalemia -cause unclear.  Patient is receiving Lokelma sodium bicarb insulin D50.  Repeat metabolic panel. 4. Macrocytic anemia appears to be chronic follow CBC check anemia panel with next blood draw. 5. History of dementia but the chart. 6. Recent cystoscopy and also had urinary catheter placed for urine retention which was removed.   DVT prophylaxis: Lovenox. Code Status: Full code. Family Communication: Patient's wife. Disposition Plan: Home. Consults called:  Cardiology. Admission status: Observation.   Rise Patience MD [facility] Pager [phone].  If 7PM-7AM, please contact night-coverage [email or web address] Password [email or web address]  07/10/2020, 3:19 AM

## 2020-07-10 NOTE — ED Provider Notes (Addendum)
[facility] DEPT Provider Note   CSN: [id number] Arrival date & time: 07/09/20  1838     History Chief Complaint  Patient presents with  . Weakness    Ian Harrison is a 86 y.o. male.  The history is provided by the spouse. The history is limited by the condition of the patient (level 5 dementia ).  Weakness Severity:  Moderate Onset quality:  Gradual Duration:  3 days Timing:  Constant Progression:  Worsening Chronicity:  New Context: not stress   Context comment:  Recent urologic procedure  Relieved by:  Nothing Worsened by:  Nothing Ineffective treatments:  None tried Associated symptoms: difficulty walking   Associated symptoms: no diarrhea, no fever, no seizures, no shortness of breath and no vomiting   Associated symptoms comment:  Cloudy urine  Risk factors: coronary artery disease   Risk factors: no anemia   Patient with CAD, CKD who has been so weak that he couldn't get up off the couch even with assistance tonight.  No f/c/r.       Past Medical History:  Diagnosis Date  . Aortic aneurysm ([place])   . CAD (coronary artery disease)   . Cancer [facility])    Skin cancer  . Chronic kidney disease   . Dementia ([place])   . Diabetes mellitus type II, non insulin dependent ([place])   . Dyslipidemia    Intolerance to atorvastatin due to myalgias  . Myocardial infarction ([place])    15 years ago  . Vasovagal syncope    Without recurrence    Patient Active Problem List   Diagnosis Date Noted  . Dementia without behavioral disturbance ([place]) 09/24/2019  . Coronary artery disease involving native coronary artery of native heart without angina pectoris 09/03/2019  . Dyslipidemia 09/03/2019  . Essential hypertension 09/03/2019  . AAA (abdominal aortic aneurysm) without rupture ([place]) 09/03/2019    Past Surgical History:  Procedure Laterality Date  . ABDOMINAL AORTIC ANEURYSM REPAIR    . APPENDECTOMY    . CATARACT EXTRACTION W/ INTRAOCULAR  LENS IMPLANT    . CYSTOSCOPY WITH FULGERATION N/A 06/23/2020   Procedure: CYSTOSCOPY WITH CLOT EVACUATION;  Surgeon: Robley Fries, MD;  Location: [facility];  Service: Urology;  Laterality: N/A;  . HERNIA REPAIR    . TRANSURETHRAL RESECTION OF BLADDER TUMOR N/A 06/23/2020   Procedure: TRANSURETHRAL RESECTION OF BLADDER TUMOR (TURBT);  Surgeon: Robley Fries, MD;  Location: [facility];  Service: Urology;  Laterality: N/A;  90 MINS  . WISDOM TOOTH EXTRACTION         Family History  Problem Relation Age of Onset  . Heart attack Brother     Social History   Tobacco Use  . Smoking status: Former Research scientist (life sciences)  . Smokeless tobacco: Never Used  . Tobacco comment: Quit 17 years ago  Vaping Use  . Vaping Use: Never used  Substance Use Topics  . Alcohol use: Not Currently  . Drug use: Never    Home Medications Prior to Admission medications   Medication Sig Start Date End Date Taking? Authorizing Provider  aspirin EC 81 MG tablet Take 81 mg by mouth daily with breakfast.    Yes [provider]  cephALEXin (KEFLEX) 500 MG capsule Take 500 mg by mouth 2 (two) times daily.   Yes [provider]  clopidogrel (PLAVIX) 75 MG tablet Take 1 tablet (75 mg total) by mouth daily. Patient taking differently: Take 75 mg by mouth daily with breakfast.  03/05/20  Yes  Tobb, Kardie, DO  metoprolol succinate (TOPROL-XL) 25 MG 24 hr tablet Take 1 tablet (25 mg total) by mouth daily. Patient taking differently: Take 25 mg by mouth daily with breakfast.  03/05/20  Yes Tobb, Kardie, DO  tamsulosin (FLOMAX) 0.4 MG CAPS capsule Take 0.4 mg by mouth at bedtime. 07/01/20  Yes [provider]    Allergies    Atorvastatin and Morphine and related  Review of Systems   Review of Systems  Unable to perform ROS: Dementia  Constitutional: Negative for fever.  Respiratory: Negative for shortness of breath.   Gastrointestinal: Negative for diarrhea and vomiting.  Neurological: Positive for  weakness. Negative for seizures.    Physical Exam Updated Vital Signs BP 137/72   Pulse 86   Temp 99 F (37.2 C) (Oral)   Resp 18   SpO2 98%   Physical Exam Vitals and nursing note reviewed.  Constitutional:      General: He is not in acute distress.    Appearance: Normal appearance.  HENT:     Head: Normocephalic and atraumatic.     Nose: Nose normal.  Eyes:     Conjunctiva/sclera: Conjunctivae normal.     Pupils: Pupils are equal, round, and reactive to light.  Cardiovascular:     Rate and Rhythm: Normal rate and regular rhythm.     Pulses: Normal pulses.     Heart sounds: Normal heart sounds.  Pulmonary:     Effort: Pulmonary effort is normal.     Breath sounds: Normal breath sounds.  Abdominal:     General: Abdomen is flat. Bowel sounds are normal.     Palpations: Abdomen is soft.     Tenderness: There is no abdominal tenderness. There is no guarding or rebound.  Musculoskeletal:        General: Normal range of motion.     Cervical back: Normal range of motion and neck supple.  Skin:    General: Skin is warm and dry.     Capillary Refill: Capillary refill takes less than 2 seconds.  Neurological:     General: No focal deficit present.     Mental Status: He is alert.     Deep Tendon Reflexes: Reflexes normal.  Psychiatric:        Behavior: Behavior is aggressive.     ED Results / Procedures / Treatments   Labs (all labs ordered are listed, but only abnormal results are displayed) Results for orders placed or performed during the hospital encounter of 07/09/20  CBC with Differential/Platelet  Result Value Ref Range   WBC 22.2 (H) 4.0 - 10.5 K/uL   RBC 3.46 (L) 4.22 - 5.81 MIL/uL   Hemoglobin 11.4 (L) 13.0 - 17.0 g/dL   HCT 35.8 (L) 39 - 52 %   MCV 103.5 (H) 80.0 - 100.0 fL   MCH 32.9 26.0 - 34.0 pg   MCHC 31.8 30.0 - 36.0 g/dL   RDW 14.2 11.5 - 15.5 %   Platelets 188 150 - 400 K/uL   nRBC 0.0 0.0 - 0.2 %   Neutrophils Relative % 85 %   Neutro Abs  18.7 (H) 1.7 - 7.7 K/uL   Lymphocytes Relative 8 %   Lymphs Abs 1.8 0.7 - 4.0 K/uL   Monocytes Relative 5 %   Monocytes Absolute 1.2 (H) 0 - 1 K/uL   Eosinophils Relative 0 %   Eosinophils Absolute 0.0 0 - 0 K/uL   Basophils Relative 0 %   Basophils Absolute 0.1 0 -  0 K/uL   Immature Granulocytes 2 %   Abs Immature Granulocytes 0.44 (H) 0.00 - 0.07 K/uL  Urinalysis, Routine w reflex microscopic Urine, Catheterized  Result Value Ref Range   Color, Urine YELLOW YELLOW   APPearance TURBID (A) CLEAR   Specific Gravity, Urine 1.021 1.005 - 1.030   pH 5.0 5.0 - 8.0   Glucose, UA NEGATIVE NEGATIVE mg/dL   Hgb urine dipstick LARGE (A) NEGATIVE   Bilirubin Urine NEGATIVE NEGATIVE   Ketones, ur NEGATIVE NEGATIVE mg/dL   Protein, ur 100 (A) NEGATIVE mg/dL   Nitrite POSITIVE (A) NEGATIVE   Leukocytes,Ua LARGE (A) NEGATIVE   RBC / HPF >50 (H) 0 - 5 RBC/hpf   WBC, UA >50 (H) 0 - 5 WBC/hpf   Bacteria, UA FEW (A) NONE SEEN   WBC Clumps PRESENT    Mucus PRESENT   I-stat chem 8, ED (not at [facility] or [facility])  Result Value Ref Range   Sodium 135 135 - 145 mmol/L   Potassium 6.5 (HH) 3.5 - 5.1 mmol/L   Chloride 102 98 - 111 mmol/L   BUN 28 (H) 8 - 23 mg/dL   Creatinine, Ser 1.00 0.61 - 1.24 mg/dL   Glucose, Bld 134 (H) 70 - 99 mg/dL   Calcium, Ion 1.14 (L) 1.15 - 1.40 mmol/L   TCO2 27 22 - 32 mmol/L   Hemoglobin 12.2 (L) 13.0 - 17.0 g/dL   HCT 36.0 (L) 39 - 52 %   Comment NOTIFIED PHYSICIAN   Troponin I (High Sensitivity)  Result Value Ref Range   Troponin I (High Sensitivity) 12 <18 ng/L   DG Chest Portable 1 View  Result Date: 07/09/2020 CLINICAL DATA:  Weakness, urinary tract infection EXAM: PORTABLE CHEST 1 VIEW COMPARISON:  06/09/2020 FINDINGS: Single frontal view of the chest demonstrates an unremarkable cardiac silhouette. No airspace disease, effusion, or pneumothorax. No acute bony abnormalities. IMPRESSION: 1. No acute intrathoracic process. Electronically Signed   By: Randa Ngo  M.D.   On: 07/09/2020 23:45    EKG See muse  Radiology DG Chest Portable 1 View  Result Date: 07/09/2020 CLINICAL DATA:  Weakness, urinary tract infection EXAM: PORTABLE CHEST 1 VIEW COMPARISON:  06/09/2020 FINDINGS: Single frontal view of the chest demonstrates an unremarkable cardiac silhouette. No airspace disease, effusion, or pneumothorax. No acute bony abnormalities. IMPRESSION: 1. No acute intrathoracic process. Electronically Signed   By: Randa Ngo M.D.   On: 07/09/2020 23:45    Procedures Procedures (including critical care time)  Medications Ordered in ED Medications  0.9 %  sodium chloride infusion (has no administration in time range)  cefTRIAXone (ROCEPHIN) 1 g in sodium chloride 0.9 % 100 mL IVPB (has no administration in time range)  sodium chloride 0.9 % bolus 500 mL (has no administration in time range)  albuterol (VENTOLIN HFA) 108 (90 Base) MCG/ACT inhaler 8 puff (has no administration in time range)  sodium bicarbonate injection 50 mEq (has no administration in time range)  insulin aspart (novoLOG) injection 5 Units (has no administration in time range)    And  dextrose 50 % solution 50 mL (has no administration in time range)    ED Course  I have reviewed the triage vital signs and the nursing notes.  Pertinent labs & imaging results that were available during my care of the patient were reviewed by me and considered in my medical decision making (see chart for details).    UTI with global weakness.  No signs of sepsis at  this time.  Hyperkalemia treated in the ED  MDM Reviewed: nursing note and vitals Reviewed previous: labs Interpretation: labs, ECG, x-ray and CT scan (nacpd on cxr hyperkalemia, rechecked and a UTI) Total time providing critical care: 30-74 minutes (hyperkalemia treatment ). This excludes time spent performing separately reportable procedures and services. Consults: admitting MD   CRITICAL CARE Performed by: Orlander Norwood K  Lj Miyamoto-Rasch Total critical care time: 60 minutes Critical care time was exclusive of separately billable procedures and treating other patients. Critical care was necessary to treat or prevent imminent or life-threatening deterioration. Critical care was time spent personally by me on the following activities: development of treatment plan with patient and/or surrogate as well as nursing, discussions with consultants, evaluation of patient's response to treatment, examination of patient, obtaining history from patient or surrogate, ordering and performing treatments and interventions, ordering and review of laboratory studies, ordering and review of radiographic studies, pulse oximetry and re-evaluation of patient's condition.  Final Clinical Impression(s) / ED Diagnoses Admit to medicine    Avriana Joo, MD 07/10/20 Dennie Maizes, Arrie Zuercher, MD 07/10/20 7824

## 2020-07-10 NOTE — ED Notes (Signed)
Dr. [place] Pink sent a page regarding pt's lactic acid of 2.2.

## 2020-07-10 NOTE — Progress Notes (Signed)
   Follow Up Note  HPI: 86 year old male past medical history of mild dementia, hypertension, CAD status post stenting, AAA, chronic diastolic heart failure, chronic kidney disease and diabetes mellitus recently followed up with his urologist for hematuria and urinary retention and underwent cystoscopy 2 weeks ago during which that time patient's catheter was removed. He had seen his urologist on 9/14 and had been started on antibiotic for possible UTI. Patient has been able to void without needing to self catheterize. He presented to the emergency room on the evening of 9/15 with complaints of weakness and rigors. In the emergency room, noted to have a white count of twenty-two, potassium 6.3 and urinalysis for large UTI. EKG noted some slight T wave and ST depression and patient was complaining of some mild chest discomfort. Evaluated by hospitalist service.  Patient admitted earlier this morning. Seen later this morning in the emergency room. Is resting comfortably, minimally confused. Initial troponin was minimally elevated. Patient seen by cardiology and given acute infection as well as advanced age, not felt to be a candidate for aggressive invasive intervention. They recommended decreasing his Toprol and adding Imdur.  Exam: CV: Regular rate and rhythm, S1-S2 Lungs: Clear to auscultation bilaterally Abd: Soft, nontender, nondistended, positive bowel sounds Ext: No clubbing or cyanosis or edema  Principal Problem:   Acute lower UTI: Awaiting urine cultures. Continue IV Rocephin. Follow white blood cell count still elevated although still early to evaluate this. Procalcitonin level relatively normal.  Active Problems:   Coronary artery disease involving native coronary artery of native heart without angina pectoris: Appreciate cardiology help. Nitrates added and Toprol slightly scaled back.   Essential hypertension: Blood pressure slightly on the lower end    AAA (abdominal aortic aneurysm)  without rupture ([place]): Stable.    Dementia without behavioral disturbance ([place]): At baseline, relatively alert and interactive appropriately.    Generalized weakness: We will have PT evaluate.    Chest pain: May be some angina. Following troponins. Elevated troponin may be demand ischemia versus mild elevated due to CHF. Continue to follow    Hyperkalemia: Treated with Lokelma. Potassium later this morning at 3.8.   Diabetes mellitus type II, non insulin dependent (HCC) A1c at 6.0, diet controlled.    Chronic kidney disease stage II, at baseline.    Chronic diastolic CHF (congestive heart failure) ([place]): May have some acute heart failure. Checking BNP.   Disposition: Continue in hospital following troponins, urinary infection

## 2020-07-10 NOTE — Consult Note (Addendum)
Cardiology Consultation:   Patient ID: Ian Harrison; [id number]; 04/14/1927   Admit date: 07/09/2020 Date of Consult: 07/10/2020  Primary Care Provider: Mackie Pai, PA-C Primary Cardiologist: Dr. Berniece Salines, MD   Patient Profile:   Ian Harrison is a 86 y.o. male with a hx of prior MI requiring RCA stenting in 2005, NSTEMI 02/2015 with second RCA stent placement with residual 50% LCx disease ([place]), aortic aneurysm s/p repair in 2007, dyslipidemia intolerant to statins, DM2, CKD and dementia who is being seen today for the evaluation of chest pain at the request of Dr. Hal Hope.  History of Present Illness:   Ian Harrison is a 86 year old male with a history stated above who presented with generalized weakness, rigors and difficulty with ambulation. HPI obtained from wife who is at bedside. Wife states that he was seen 06/23/20 in the ED for hematuria and difficulty urinating at which time he was referred to urology. He underwent a cystoscopy for a 7 x 4 cm bladder presumed bladder tumor however no mass or cancer was found. Following his procedure, a Foley catheter was left in place.  He was seen by urology 07/08/2020 at which time the Foley catheter was removed with instructions to self cath at home>>per wife. He was started on empiric antibiotics for possible UTI per UA results. Cultures were sent. On 07/09/2020, patient began having chills and difficulty with ambulation along with lower abdominal pain. Given his history, he presented to the ED for further evaluation.  Wife states that since his MI many years ago, he would occasional have exertional angina and would take a SL NTG with symptom relief. They state that over the last several weeks, his angina has become more frequent. Pain is typically relieved with rest. He has no associated SOB, diaphoresis, nausea or vomiying. He is relatively active at baseline however has been less due to having the catheter for the last 4 weeks.  He is compliant with his ASA and Plavix.   In the ED, WBC found to be elevated at 22.2 with a K+ at 6.3. Creatinine was stable.  UA positive for UTI. COVID negative. EKG with possible mild ST depressions/TWI in leads V4-V5 however subsequent tracing performed today shows more pronounced TWI in both V4 and V6 leads. hsT noted to be 12>>>12. CXR with no acute intrathoracic process.  Given history of renal mass, renal stone CT performed which showed no obstructive uropathy or nephroureterolithiasis however few calcifications in the dependent portion of the urinary bladder felt to be sequela of recently resected bladder tumor, cholelithiasis without acute inflammation and enlarged prostate.  He is followed by Dr. Harriet Masson for his cardiology care.  He underwent LHC 07/20/2006 at which time an RCA stent was placed.  Repeat cardiac catheterization 06/28/2008 with a widely patent RCA stent and moderate nonobstructive CAD.  Myocardial perfusion study 08/11/2011 which was found to be normal. LHC 03/14/2015 with RCA stenting x2 and moderate LCx disease at 50% stenosis. Echocardiogram 09/06/2019 with normal LVEF and G1 DD.  Past Medical History:  Diagnosis Date  . Aortic aneurysm ([place])   . CAD (coronary artery disease)   . Cancer [facility])    Skin cancer  . Chronic kidney disease   . Dementia ([place])   . Diabetes mellitus type II, non insulin dependent ([place])   . Dyslipidemia    Intolerance to atorvastatin due to myalgias  . Myocardial infarction ([place])    15 years ago  . Vasovagal syncope    Without recurrence  Past Surgical History:  Procedure Laterality Date  . ABDOMINAL AORTIC ANEURYSM REPAIR    . APPENDECTOMY    . CATARACT EXTRACTION W/ INTRAOCULAR LENS IMPLANT    . CYSTOSCOPY WITH FULGERATION N/A 06/23/2020   Procedure: CYSTOSCOPY WITH CLOT EVACUATION;  Surgeon: Robley Fries, MD;  Location: [facility];  Service: Urology;  Laterality: N/A;  . HERNIA REPAIR    . TRANSURETHRAL RESECTION OF BLADDER TUMOR N/A  06/23/2020   Procedure: TRANSURETHRAL RESECTION OF BLADDER TUMOR (TURBT);  Surgeon: Robley Fries, MD;  Location: [facility];  Service: Urology;  Laterality: N/A;  90 MINS  . WISDOM TOOTH EXTRACTION       Prior to Admission medications   Medication Sig Start Date End Date Taking? Authorizing Provider  aspirin EC 81 MG tablet Take 81 mg by mouth daily with breakfast.    Yes [provider]  cephALEXin (KEFLEX) 500 MG capsule Take 500 mg by mouth 2 (two) times daily.   Yes [provider]  clopidogrel (PLAVIX) 75 MG tablet Take 1 tablet (75 mg total) by mouth daily. Patient taking differently: Take 75 mg by mouth daily with breakfast.  03/05/20  Yes Tobb, Kardie, DO  metoprolol succinate (TOPROL-XL) 25 MG 24 hr tablet Take 1 tablet (25 mg total) by mouth daily. Patient taking differently: Take 25 mg by mouth daily with breakfast.  03/05/20  Yes Tobb, Kardie, DO  tamsulosin (FLOMAX) 0.4 MG CAPS capsule Take 0.4 mg by mouth at bedtime. 07/01/20  Yes [provider]    Inpatient Medications: Scheduled Meds: . aspirin EC  81 mg Oral Q breakfast  . clopidogrel  75 mg Oral Q breakfast  . enoxaparin (LOVENOX) injection  40 mg Subcutaneous Q24H  . metoprolol succinate  25 mg Oral Daily  . tamsulosin  0.4 mg Oral QHS   Continuous Infusions: . sodium chloride 125 mL/hr at 07/10/20 0353  . [START ON 07/11/2020] cefTRIAXone (ROCEPHIN)  IV     PRN Meds: nitroGLYCERIN, ondansetron **OR** ondansetron (ZOFRAN) IV  Allergies:    Allergies  Allergen Reactions  . Atorvastatin Other (See Comments)    myalgia  . Morphine And Related Other (See Comments)    Mood changes (anxious)    Social History:   Social History   Socioeconomic History  . Marital status: Married    Spouse name: Not on file  . Number of children: Not on file  . Years of education: Not on file  . Highest education level: Not on file  Occupational History  . Not on file  Tobacco Use  . Smoking status:  Former Research scientist (life sciences)  . Smokeless tobacco: Never Used  . Tobacco comment: Quit 17 years ago  Vaping Use  . Vaping Use: Never used  Substance and Sexual Activity  . Alcohol use: Not Currently  . Drug use: Never  . Sexual activity: Not on file  Other Topics Concern  . Not on file  Social History Narrative  . Not on file   Social Determinants of Health   Financial Resource Strain:   . Difficulty of Paying Living Expenses: Not on file  Food Insecurity:   . Worried About Charity fundraiser in the Last Year: Not on file  . Ran Out of Food in the Last Year: Not on file  Transportation Needs:   . Lack of Transportation (Medical): Not on file  . Lack of Transportation (Non-Medical): Not on file  Physical Activity:   . Days of Exercise per Week: Not on  file  . Minutes of Exercise per Session: Not on file  Stress:   . Feeling of Stress : Not on file  Social Connections:   . Frequency of Communication with Friends and Family: Not on file  . Frequency of Social Gatherings with Friends and Family: Not on file  . Attends Religious Services: Not on file  . Active Member of Clubs or Organizations: Not on file  . Attends Archivist Meetings: Not on file  . Marital Status: Not on file  Intimate Partner Violence:   . Fear of Current or Ex-Partner: Not on file  . Emotionally Abused: Not on file  . Physically Abused: Not on file  . Sexually Abused: Not on file    Family History:   Family History  Problem Relation Age of Onset  . Heart attack Brother    Family Status:  Family Status  Relation Name Status  . Brother  (Not Specified)    ROS:  Please see the history of present illness.  All other ROS reviewed and negative.     Physical Exam/Data:   Vitals:   07/10/20 0100 07/10/20 0115 07/10/20 0130 07/10/20 0145  BP: (!) 139/58 137/61 137/62 133/63  Pulse: 84  76 80  Resp: (!) 22 16 20 15   Temp:      TempSrc:      SpO2: 95%  94% 98%    Intake/Output Summary (Last 24  hours) at 07/10/2020 0736 Last data filed at 07/10/2020 0300 Gross per 24 hour  Intake 600 ml  Output --  Net 600 ml   There were no vitals filed for this visit. There is no height or weight on file to calculate BMI.   General: Appears younger than stated age, NAD Neck: Negative for carotid bruits. No JVD Lungs:Clear to ausculation bilaterally. No wheezes, rales, or rhonchi. Breathing is unlabored. Cardiovascular: RRR with S1 S2. No murmurs Abdomen: Soft, tender in the lower abdomen, non-distended. No obvious abdominal masses. Extremities: No edema. Radial pulses 2+ bilaterally Neuro: Alert and oriented. No focal deficits. No facial asymmetry. MAE spontaneously. Psych: Responds to questions appropriately with normal affect.     EKG:  The EKG was personally reviewed and demonstrates: 07/09/20 and 07/10/20 EKG with possible mild ST depressions/TWI in leads V4-V5 however subsequent tracing performed today shows more pronounced TWI in both V4 and V6 leads.  Telemetry:  Telemetry was personally reviewed and demonstrates: 07/10/20 NSR with rates in the 70-80's   Relevant CV Studies:  Echocardiogram: See scanned documents in Epic   LHC: See scanned documents in Epic   Laboratory Data:  Chemistry Recent Labs  Lab 07/09/20 2306 07/10/20 0012  NA 133* 135  K 6.3* 6.5*  CL 100 102  CO2 23  --   GLUCOSE 135* 134*  BUN 22 28*  CREATININE 1.03 1.00  CALCIUM 8.9  --   GFRNONAA >60  --   GFRAA >60  --   ANIONGAP 10  --     Total Protein  Date Value Ref Range Status  07/09/2020 7.7 6.5 - 8.1 g/dL Final   Albumin  Date Value Ref Range Status  07/09/2020 4.0 3.5 - 5.0 g/dL Final   AST  Date Value Ref Range Status  07/09/2020 28 15 - 41 U/L Final   ALT  Date Value Ref Range Status  07/09/2020 13 0 - 44 U/L Final   Alkaline Phosphatase  Date Value Ref Range Status  07/09/2020 45 38 - 126 U/L Final  Total Bilirubin  Date Value Ref Range Status  07/09/2020 2.0 (H) 0.3 -  1.2 mg/dL Final   Hematology Recent Labs  Lab 07/09/20 2306 07/10/20 0012  WBC 22.2*  --   RBC 3.46*  --   HGB 11.4* 12.2*  HCT 35.8* 36.0*  MCV 103.5*  --   MCH 32.9  --   MCHC 31.8  --   RDW 14.2  --   PLT 188  --    Cardiac EnzymesNo results for input(s): TROPONINI in the last 168 hours. No results for input(s): TROPIPOC in the last 168 hours.  BNPNo results for input(s): BNP, PROBNP in the last 168 hours.  DDimer No results for input(s): DDIMER in the last 168 hours. TSH: No results found for: TSH Lipids:No results found for: CHOL, HDL, LDLCALC, LDLDIRECT, TRIG, CHOLHDL HgbA1c: Lab Results  Component Value Date   HGBA1C 6.0 (H) 06/18/2020    Radiology/Studies:  DG Chest Portable 1 View  Result Date: 07/09/2020 CLINICAL DATA:  Weakness, urinary tract infection EXAM: PORTABLE CHEST 1 VIEW COMPARISON:  06/09/2020 FINDINGS: Single frontal view of the chest demonstrates an unremarkable cardiac silhouette. No airspace disease, effusion, or pneumothorax. No acute bony abnormalities. IMPRESSION: 1. No acute intrathoracic process. Electronically Signed   By: Randa Ngo M.D.   On: 07/09/2020 23:45   CT Renal Stone Study  Result Date: 07/10/2020 CLINICAL DATA:  Urinary tract infection EXAM: CT ABDOMEN AND PELVIS WITHOUT CONTRAST TECHNIQUE: Multidetector CT imaging of the abdomen and pelvis was performed following the standard protocol without IV contrast. COMPARISON:  None. FINDINGS: LOWER CHEST: Normal. HEPATOBILIARY: Normal hepatic contours. No intra- or extrahepatic biliary dilatation. There is cholelithiasis without acute inflammation. PANCREAS: Normal pancreas. No ductal dilatation or peripancreatic fluid collection. SPLEEN: Normal. ADRENALS/URINARY TRACT: The adrenal glands are normal. No hydronephrosis, nephroureterolithiasis or solid renal mass. There are 2 calcifications in the dependent portion of the urinary bladder, each measuring approximately 2 mm. STOMACH/BOWEL: There  is no hiatal hernia. Normal duodenal course and caliber. No small bowel dilatation or inflammation. No focal colonic abnormality. The appendix is not visualized. No right lower quadrant inflammation or free fluid. VASCULAR/LYMPHATIC: There is calcific atherosclerosis of the abdominal aorta. No lymphadenopathy. REPRODUCTIVE: Enlarged prostate measures 5.5 cm in transverse dimension. MUSCULOSKELETAL. No bony spinal canal stenosis or focal osseous abnormality. OTHER: None. IMPRESSION: 1. No obstructive uropathy or nephroureterolithiasis. Two calcifications in the dependent portion of the urinary bladder may be sequela of recently resected bladder tumor. 2. Cholelithiasis without acute inflammation. 3. Enlarged prostate. Aortic Atherosclerosis (ICD10-I70.0). Electronically Signed   By: Ulyses Jarred M.D.   On: 07/10/2020 02:32   Assessment and Plan:   1. Chest pain with known CAD s/p RCA PCI x2 (2005, 2016): -Wife states that since his MI many years ago, he would occasional have exertional angina and would take a SL NTG with symptom relief. They state that over the last several weeks, his angina has become more frequent. Pain is typically relieved with rest. -LHC 07/20/2006 at which time an RCA stent was placed.  Repeat cardiac catheterization 06/28/2008 with a widely patent RCA stent and moderate nonobstructive CAD.  Myocardial perfusion study 08/11/2011 which was found to be normal. LHC 03/14/2015 with RCA stenting x2 and moderate LCx disease at 50% stenosis. Echocardiogram 09/06/2019 with normal LVEF and G1 DD. -EKG with possible mild ST depressions/TWI in leads V4-V5 however subsequent tracing performed today shows more pronounced TWI in both V4 and V6 leads.  -hsT noted to be 12>>>12. -  Maintained on home PTA ASA 81, Plavix 75, Toprol-XL 25 -Statin intolerant -Not currently a candidate for invasive procedure given acute infection. Would recommend adding long-acting nitrates to regimen and following symptoms.  If patient willing, would likely need further assessment for CAD progression in the previously stented PCA vessel and known LCx 50% stenosis on last LHC however given advanced age, would start with medical therapy initially. -Reduce Toprol to 12.5mg  PO QD given soft BP and add 15mg  Imdur QD  -Continue ASA, Plavix   2. UTI with recent post-operative indwelling catheter: -Pt presented with generalized weakness, rigors and difficulty with ambulation. He has been recently followed by urology for hematuria and urinary retention for which he underwent a cystoscopy for a 7 x 4 cm bladder tumor. Following his procedure, a Foley left in place.  He was seen by urology 07/08/2020 at which time the Foley catheter was removed with instructions to self cath at home.  He was also being treated empirically with antibiotic therapy for possible UTI.  On 07/09/2020, patient began having fevers, chills and difficulty with ambulation along with abdominal pain.  Given his history, he presented to the ED for further evaluation. -On presentation, WBC found to be elevated at 22.2 with a K+ at 6.3.  -Creatinine was stable.   -UA positive for UTI.  -Renal stone CT performed which showed no obstructive uropathy or nephroureterolithiasis however few calcifications in the dependent portion of the urinary bladder felt to be sequela of recently resected bladder tumor, cholelithiasis without acute inflammation and enlarged prostate.  3.  Hypertension: -Stable, 101/59, 133/63, 137/62 -Reduce PTA Toprol to 12.5mg  PO QD  -Add Imdur 15mg    4.  Hyperlipidemia: -Scanned lab work from 06/25/2019 shows an LDL at 9 -Not currently on statin therapy secondary to intolerance -Could consider referral to outpatient lipid clinic for possible PCSK9 inhibitor  5.  History of AAA disease with repair: -Stable, no new symptoms  -BP well controlled    For questions or updates, please contact [place] Please consult [email or web address] for  contact info under Cardiology/STEMI.   SignedKathyrn Drown NP-C [facility] Pager: [phone] 07/10/2020 7:36 AM  I have seen and examined the patient along with Kathyrn Drown NP-C.  I have reviewed the chart, notes and new data.  I agree with PA/NP's note.  Key new complaints: he denies angina, but his wife has reported him complaining of chest discomfort (he has memory problems and wife is no longer at bedside, d/w daughter) Key examination changes: appears younger than stated age, fit. Normal CV exam Key new findings / data: dramatic dynamic changes on the ECG, returned to baseline; mild elevation in hsTropI .  PLAN: Essentially he failed the "stress test" brought on by hyperdynamic state in response to severe infection. He probably has a high grade stenosis in the LCX artery, likely with a fairly large area of muscle in jeopardy, based on distribution of ST depression. Mild increase in troponin consistent with demand ischemia rather than acute coronary event. No urgency to perform further evaluation, wait for infection to abate. Options include going straight to cardiac cath (with associated procedural risks at age 89) versus further risk stratification with a nuclear study. Either way, continue ASA, lipid lowering Rx (re-challenge with statin versus start PCSK9 inhibitor), beta blockers.  Sanda Klein, MD, [place] [phone] 07/10/2020, 4:02 PM

## 2020-07-10 NOTE — ED Notes (Signed)
Unable to collect second set of blood cultures.

## 2020-07-10 NOTE — ED Notes (Addendum)
Entered in error

## 2020-07-10 NOTE — ED Notes (Signed)
Date and time results received: 07/10/20 7:06 PM  Test: tropinin Critical Value: 2,938  Name of Provider Notified: M. Sharlet Salina APP coverage  Orders Received? Or Actions Taken?: Waiting for response after paging.

## 2020-07-11 DIAGNOSIS — Z955 Presence of coronary angioplasty implant and graft: Secondary | ICD-10-CM | POA: Diagnosis not present

## 2020-07-11 DIAGNOSIS — N390 Urinary tract infection, site not specified: Secondary | ICD-10-CM | POA: Diagnosis not present

## 2020-07-11 DIAGNOSIS — Y846 Urinary catheterization as the cause of abnormal reaction of the patient, or of later complication, without mention of misadventure at the time of the procedure: Secondary | ICD-10-CM | POA: Diagnosis present

## 2020-07-11 DIAGNOSIS — I5032 Chronic diastolic (congestive) heart failure: Secondary | ICD-10-CM

## 2020-07-11 DIAGNOSIS — I714 Abdominal aortic aneurysm, without rupture: Secondary | ICD-10-CM | POA: Diagnosis not present

## 2020-07-11 DIAGNOSIS — Z7902 Long term (current) use of antithrombotics/antiplatelets: Secondary | ICD-10-CM | POA: Diagnosis not present

## 2020-07-11 DIAGNOSIS — A419 Sepsis, unspecified organism: Secondary | ICD-10-CM | POA: Diagnosis present

## 2020-07-11 DIAGNOSIS — D631 Anemia in chronic kidney disease: Secondary | ICD-10-CM | POA: Diagnosis present

## 2020-07-11 DIAGNOSIS — E1122 Type 2 diabetes mellitus with diabetic chronic kidney disease: Secondary | ICD-10-CM | POA: Diagnosis present

## 2020-07-11 DIAGNOSIS — Z85828 Personal history of other malignant neoplasm of skin: Secondary | ICD-10-CM | POA: Diagnosis not present

## 2020-07-11 DIAGNOSIS — I2510 Atherosclerotic heart disease of native coronary artery without angina pectoris: Secondary | ICD-10-CM | POA: Diagnosis present

## 2020-07-11 DIAGNOSIS — N183 Chronic kidney disease, stage 3 unspecified: Secondary | ICD-10-CM | POA: Diagnosis present

## 2020-07-11 DIAGNOSIS — N179 Acute kidney failure, unspecified: Secondary | ICD-10-CM | POA: Diagnosis present

## 2020-07-11 DIAGNOSIS — I21A1 Myocardial infarction type 2: Secondary | ICD-10-CM | POA: Diagnosis present

## 2020-07-11 DIAGNOSIS — Z7984 Long term (current) use of oral hypoglycemic drugs: Secondary | ICD-10-CM | POA: Diagnosis not present

## 2020-07-11 DIAGNOSIS — E785 Hyperlipidemia, unspecified: Secondary | ICD-10-CM | POA: Diagnosis present

## 2020-07-11 DIAGNOSIS — Z87891 Personal history of nicotine dependence: Secondary | ICD-10-CM | POA: Diagnosis not present

## 2020-07-11 DIAGNOSIS — F0390 Unspecified dementia without behavioral disturbance: Secondary | ICD-10-CM | POA: Diagnosis present

## 2020-07-11 DIAGNOSIS — D6959 Other secondary thrombocytopenia: Secondary | ICD-10-CM | POA: Diagnosis present

## 2020-07-11 DIAGNOSIS — Z7982 Long term (current) use of aspirin: Secondary | ICD-10-CM | POA: Diagnosis not present

## 2020-07-11 DIAGNOSIS — R531 Weakness: Secondary | ICD-10-CM | POA: Diagnosis not present

## 2020-07-11 DIAGNOSIS — I214 Non-ST elevation (NSTEMI) myocardial infarction: Secondary | ICD-10-CM | POA: Diagnosis not present

## 2020-07-11 DIAGNOSIS — I219 Acute myocardial infarction, unspecified: Secondary | ICD-10-CM

## 2020-07-11 DIAGNOSIS — N3001 Acute cystitis with hematuria: Secondary | ICD-10-CM | POA: Diagnosis present

## 2020-07-11 DIAGNOSIS — R6520 Severe sepsis without septic shock: Secondary | ICD-10-CM | POA: Diagnosis present

## 2020-07-11 DIAGNOSIS — Z20822 Contact with and (suspected) exposure to covid-19: Secondary | ICD-10-CM | POA: Diagnosis present

## 2020-07-11 DIAGNOSIS — I130 Hypertensive heart and chronic kidney disease with heart failure and stage 1 through stage 4 chronic kidney disease, or unspecified chronic kidney disease: Secondary | ICD-10-CM | POA: Diagnosis present

## 2020-07-11 DIAGNOSIS — E875 Hyperkalemia: Secondary | ICD-10-CM | POA: Diagnosis present

## 2020-07-11 DIAGNOSIS — T83518A Infection and inflammatory reaction due to other urinary catheter, initial encounter: Secondary | ICD-10-CM | POA: Diagnosis present

## 2020-07-11 DIAGNOSIS — I252 Old myocardial infarction: Secondary | ICD-10-CM | POA: Diagnosis not present

## 2020-07-11 DIAGNOSIS — R079 Chest pain, unspecified: Secondary | ICD-10-CM | POA: Diagnosis not present

## 2020-07-11 LAB — CBC
HCT: 30.7 % — ABNORMAL LOW (ref 39.0–52.0)
Hemoglobin: 10 g/dL — ABNORMAL LOW (ref 13.0–17.0)
MCH: 33.3 pg (ref 26.0–34.0)
MCHC: 32.6 g/dL (ref 30.0–36.0)
MCV: 102.3 fL — ABNORMAL HIGH (ref 80.0–100.0)
Platelets: 145 10*3/uL — ABNORMAL LOW (ref 150–400)
RBC: 3 MIL/uL — ABNORMAL LOW (ref 4.22–5.81)
RDW: 14.2 % (ref 11.5–15.5)
WBC: 17.5 10*3/uL — ABNORMAL HIGH (ref 4.0–10.5)
nRBC: 0 % (ref 0.0–0.2)

## 2020-07-11 LAB — BASIC METABOLIC PANEL
Anion gap: 9 (ref 5–15)
BUN: 20 mg/dL (ref 8–23)
CO2: 24 mmol/L (ref 22–32)
Calcium: 8.3 mg/dL — ABNORMAL LOW (ref 8.9–10.3)
Chloride: 104 mmol/L (ref 98–111)
Creatinine, Ser: 1 mg/dL (ref 0.61–1.24)
GFR calc Af Amer: >60 mL/min (ref >60)
GFR calc non Af Amer: >60 mL/min (ref >60)
Glucose, Bld: 132 mg/dL — ABNORMAL HIGH (ref 70–99)
Potassium: 4 mmol/L (ref 3.5–5.1)
Sodium: 137 mmol/L (ref 135–145)

## 2020-07-11 LAB — LACTIC ACID, PLASMA: Lactic Acid, Venous: 1.4 mmol/L (ref 0.5–1.9)

## 2020-07-11 MED ORDER — SODIUM CHLORIDE 0.9% FLUSH
3.0000 mL | Freq: Two times a day (BID) | INTRAVENOUS | Status: DC
  Administered 2020-07-11 – 2020-07-16 (×7): 3 mL via INTRAVENOUS

## 2020-07-11 MED ORDER — TRAMADOL HCL 50 MG PO TABS
50.0000 mg | ORAL_TABLET | Freq: Three times a day (TID) | ORAL | Status: DC | PRN
  Administered 2020-07-13 – 2020-07-16 (×2): 50 mg via ORAL
  Filled 2020-07-11 (×2): qty 1

## 2020-07-11 MED ORDER — DOCUSATE SODIUM 100 MG PO CAPS
100.0000 mg | ORAL_CAPSULE | Freq: Every day | ORAL | Status: DC
  Administered 2020-07-11 – 2020-07-16 (×5): 100 mg via ORAL
  Filled 2020-07-11 (×4): qty 1

## 2020-07-11 NOTE — Progress Notes (Signed)
PT Cancellation Note  Patient Details Name: Brighton Pilley MRN: [id number] DOB: 02/15/1927   Cancelled Treatment:    Reason Eval/Treat Not Completed: Medical issues which prohibited therapy , Cardiology consulting . Noted significant increased HS troponins. Will follow for stability to progress activity.  Claretha Cooper 07/11/2020, 10:02 AM [place] Pager [phone] Office [phone]

## 2020-07-11 NOTE — Progress Notes (Signed)
PROGRESS NOTE  [name]  DOB: 19-Jul-1927  PCP: Mackie Pai, PA-C W[facility]:[id number]  DOA: 07/09/2020  LOS: 0 days   Chief Complaint  Patient presents with  . Weakness   Brief narrative: 86 year old male past medical history of mild dementia, T2DM, HTN, HLD, CAD status post stents, chronic diastolic CHF, AAA, CKD.  Patient recently followed up with his urologist for hematuria and urinary retention.  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  He subsequently underwent cystoscopy.  Following the procedure, Foley was left in place. 9/14, patient followed up with urologist, Foley catheter was removed with instructions to self cath at home.  He was also started on antibiotics for possible UTI.  He was able to void without needing to self catheterize.  9/15, patient presented to the ED with complaint of fever, chills, abdominal pain and weakness.  Labs in the ED showed WBC count elevated to 22, potassium elevated to 6.3. Urinalysis showed turbid yellow urine with large amount of leukocytes, positive nitrite. Patient also mentioned some chest discomfort.  EKG noted some slight T wave and ST depression. Admitted under hospitalist service. Cardiology consultation was obtained.  Subjective: Patient was seen and examined this afternoon. Chart reviewed. No fever.  Heart rate in 60s and 70s, blood pressure mostly between 353 and 614 systolic, breathing on room air. Labs this morning with normal potassium, normal creatinine, normal lactic acid, WBC trending down to 17.5, hemoglobin roughly stable at 10  Assessment/Plan: Severe sepsis - POA UTI associated with recent indwelling catheter and instrumentation -Presented with fever at home, chills and weakness. -Urinalysis showed turbid yellow urine with large amount of leukocytes, positive nitrite. -On presentation, patient was tachycardic more than 90, respiratory rate was more than 20 and lactic acid level was elevated to 2.2 -Currently on IV  Rocephin.  No fever recurrence.  WBC trending down.  Lactic acid normalized. -Urine cultures showing multiple species. -Continue Flomax. Recent Labs  Lab 07/09/20 2306 07/10/20 1008 07/10/20 1544 07/11/20 0420 07/11/20 0430  WBC 22.2* 23.3*  --  17.5*  --   LATICACIDVEN  --   --  2.2*  --  1.4  PROCALCITON  --  0.15  --   --   --    Chest pain Known CAD status post RCA PCI x2 Significant elevation in troponin -Complaint of chest pain on admission.   -EKG with mild ST depressions -Significant uptrending of troponin to 2900. -Has prior history of CAD status post PCI.   -Home meds include aspirin, Plavix, beta-blocker.  Statin intolerant. -Cardiology involved.  Meds adjusted.  Imdur added.  Toprol scaled back. -Noted tentative plan of cardiac catheterization during this hospitalization  Chronic diastolic CHF Essential hypertension -Monitor blood pressure, heart rate symptoms  History of AAA with repair Hyperlipidemia Statin intolerant  Dementia without behavioral disturbance  -At baseline, relatively alert and interactive appropriately.  Generalized weakness:  -Pending PT eval.  Hyperkalemia:  -Treated with Lokelma. Potassium later this morning at 3.8. Recent Labs  Lab 07/09/20 2306 07/10/20 0012 07/10/20 1008 07/11/20 0420  K 6.3* 6.5* 3.8 4.0   Diabetes mellitus type II -A1c 6 on 8/25. -Continue sliding scale with Accu-Cheks.  Chronic kidney disease stage II,  -Creatinine remains at baseline Recent Labs    06/09/20 2121 06/23/20 1035 07/09/20 2306 07/10/20 0012 07/10/20 1008 07/11/20 0420  BUN 20 15 22  28* 22 20  CREATININE 0.96 0.94 1.03 1.00 0.96 1.00   Mobility: Pending PT eval Code Status:   Code Status: Full Code  Nutritional status: Body mass index is 25.52 kg/m.     Diet Order            Diet regular Room service appropriate? Yes; Fluid consistency: Thin  Diet effective now                 DVT prophylaxis: enoxaparin (LOVENOX)  injection 40 mg Start: 07/10/20 1000   Antimicrobials:  IV Rocephin Fluid: Normal saline at 100 mL/h Consultants: Cardiology Family Communication:  Wife at bedside  Status is: Observation  The patient will require care spanning > 2 midnights and should be moved to inpatient because: Ongoing diagnostic testing needed not appropriate for outpatient work up and IV treatments appropriate due to intensity of illness or inability to take PO  Dispo: The patient is from: Home              Anticipated d/c is to: Home likely.  Pending PT eval              Anticipated d/c date is: 3 days              Patient currently is not medically stable to d/c.       Infusions:  . sodium chloride 100 mL/hr at 07/11/20 1556  . cefTRIAXone (ROCEPHIN)  IV Stopped (07/11/20 0756)    Scheduled Meds: . aspirin EC  81 mg Oral Q breakfast  . clopidogrel  75 mg Oral Q breakfast  . docusate sodium  100 mg Oral Daily  . enoxaparin (LOVENOX) injection  40 mg Subcutaneous Q24H  . isosorbide mononitrate  15 mg Oral Daily  . metoprolol succinate  12.5 mg Oral Daily  . sodium chloride flush  3 mL Intravenous Q12H  . tamsulosin  0.4 mg Oral QHS    Antimicrobials: Anti-infectives (From admission, onward)   Start     Dose/Rate Route Frequency Ordered Stop   07/11/20 0300  cefTRIAXone (ROCEPHIN) 1 g in sodium chloride 0.9 % 100 mL IVPB        1 g 200 mL/hr over 30 Minutes Intravenous Every 24 hours 07/10/20 0318     07/10/20 0115  cefTRIAXone (ROCEPHIN) 1 g in sodium chloride 0.9 % 100 mL IVPB        1 g 200 mL/hr over 30 Minutes Intravenous  Once 07/10/20 0110 07/10/20 0300      PRN meds: nitroGLYCERIN, ondansetron **OR** ondansetron (ZOFRAN) IV, traMADol   Objective: Vitals:   07/11/20 1310 07/11/20 1351  BP: 103/73 (!) 115/55  Pulse: 67 74  Resp: 16 20  Temp: 98 F (36.7 C) 97.6 F (36.4 C)  SpO2: 98% 96%    Intake/Output Summary (Last 24 hours) at 07/11/2020 1629 Last data filed at 07/11/2020  0756 Gross per 24 hour  Intake 100 ml  Output --  Net 100 ml   Filed Weights   07/10/20 0749  Weight: 83 kg   Weight change:  Body mass index is 25.52 kg/m.   Physical Exam: General exam: Appears calm and comfortable.  Not in distress Skin: No rashes, lesions or ulcers. HEENT: Atraumatic, normocephalic, supple neck, no obvious bleeding Lungs: Clear to auscultation bilaterally CVS: Regular rate and rhythm, no murmur GI/Abd soft, mild diffuse tenderness, nondistended, bowel sound present CNS: Alert, awake, oriented to place and person Psychiatry: Mood appropriate Extremities: No pedal edema, no calf tenderness  Data Review: I have personally reviewed the laboratory data and studies available.  Recent Labs  Lab 07/09/20 2306 07/10/20 0012 07/10/20 1008 07/11/20 0420  WBC 22.2*  --  23.3* 17.5*  NEUTROABS 18.7*  --  20.3*  --   HGB 11.4* 12.2* 10.5* 10.0*  HCT 35.8* 36.0* 31.4* 30.7*  MCV 103.5*  --  100.3* 102.3*  PLT 188  --  162 145*   Recent Labs  Lab 07/09/20 2306 07/10/20 0012 07/10/20 1008 07/11/20 0420  NA 133* 135 135 137  K 6.3* 6.5* 3.8 4.0  CL 100 102 103 104  CO2 23  --  23 24  GLUCOSE 135* 134* 156* 132*  BUN 22 28* 22 20  CREATININE 1.03 1.00 0.96 1.00  CALCIUM 8.9  --  8.5* 8.3*    F/u labs ordered  Signed, Terrilee Croak, MD [facility] 07/11/2020

## 2020-07-11 NOTE — ED Notes (Signed)
Patient had BM in bedside. Patient cleaned and assisted to recliner, per patient and family request.

## 2020-07-11 NOTE — ED Notes (Signed)
Assisted patient to bedside commode

## 2020-07-11 NOTE — Progress Notes (Addendum)
Progress Note  Patient Name: Ian Harrison Date of Encounter: 07/11/2020  Primary Cardiologist: Dr. Berniece Salines, MD   Subjective   Doing well today despite significant rise in hsT, now at 2938. No chest pain   Inpatient Medications    Scheduled Meds: . aspirin EC  81 mg Oral Q breakfast  . clopidogrel  75 mg Oral Q breakfast  . enoxaparin (LOVENOX) injection  40 mg Subcutaneous Q24H  . isosorbide mononitrate  15 mg Oral Daily  . metoprolol succinate  12.5 mg Oral Daily  . tamsulosin  0.4 mg Oral QHS   Continuous Infusions: . sodium chloride 100 mL/hr at 07/11/20 0200  . cefTRIAXone (ROCEPHIN)  IV 1 g (07/11/20 0229)   PRN Meds: nitroGLYCERIN, ondansetron **OR** ondansetron (ZOFRAN) IV   Vital Signs    Vitals:   07/11/20 0400 07/11/20 0500 07/11/20 0600 07/11/20 0700  BP: (!) 110/58 (!) 113/58 112/62 117/64  Pulse: 71 69 72 63  Resp: 18 18 20  (!) 21  Temp:      TempSrc:      SpO2: 90% 92% 94% 92%  Weight:      Height:       No intake or output data in the 24 hours ending 07/11/20 0737 Filed Weights   07/10/20 0749  Weight: 83 kg    Physical Exam   General: Younger than stated age, NAD Neck: Negative for carotid bruits. No JVD Lungs:Clear to ausculation bilaterally. No wheezes, rales, or rhonchi. Breathing is unlabored. Cardiovascular: RRR with S1 S2. No murmurs Abdomen: Soft, non-tender, non-distended. No obvious abdominal masses. Extremities: No edema. Radial pulses 2+ bilaterally Neuro: Alert and oriented. No focal deficits. No facial asymmetry. MAE spontaneously. Psych: Responds to questions appropriately with normal affect.    Labs    Chemistry Recent Labs  Lab 07/09/20 2306 07/09/20 2306 07/10/20 0012 07/10/20 1008 07/11/20 0420  NA 133*   < > 135 135 137  K 6.3*   < > 6.5* 3.8 4.0  CL 100   < > 102 103 104  CO2 23  --   --  23 24  GLUCOSE 135*   < > 134* 156* 132*  BUN 22   < > 28* 22 20  CREATININE 1.03   < > 1.00 0.96 1.00    CALCIUM 8.9  --   --  8.5* 8.3*  PROT 7.7  --   --  6.5  --   ALBUMIN 4.0  --   --  3.4*  --   AST 28  --   --  16  --   ALT 13  --   --  14  --   ALKPHOS 45  --   --  39  --   BILITOT 2.0*  --   --  1.4*  --   GFRNONAA >60  --   --  >60 >60  GFRAA >60  --   --  >60 >60  ANIONGAP 10  --   --  9 9   < > = values in this interval not displayed.     Hematology Recent Labs  Lab 07/09/20 2306 07/09/20 2306 07/10/20 0012 07/10/20 1008 07/11/20 0420  WBC 22.2*  --   --  23.3* 17.5*  RBC 3.46*  --   --  3.13* 3.00*  HGB 11.4*   < > 12.2* 10.5* 10.0*  HCT 35.8*   < > 36.0* 31.4* 30.7*  MCV 103.5*  --   --  100.3* 102.3*  MCH 32.9  --   --  33.5 33.3  MCHC 31.8  --   --  33.4 32.6  RDW 14.2  --   --  14.3 14.2  PLT 188  --   --  162 145*   < > = values in this interval not displayed.    Cardiac EnzymesNo results for input(s): TROPONINI in the last 168 hours. No results for input(s): TROPIPOC in the last 168 hours.   BNP Recent Labs  Lab 07/10/20 1544  BNP 616.4*     DDimer No results for input(s): DDIMER in the last 168 hours.   Radiology    DG Chest Portable 1 View  Result Date: 07/09/2020 CLINICAL DATA:  Weakness, urinary tract infection EXAM: PORTABLE CHEST 1 VIEW COMPARISON:  06/09/2020 FINDINGS: Single frontal view of the chest demonstrates an unremarkable cardiac silhouette. No airspace disease, effusion, or pneumothorax. No acute bony abnormalities. IMPRESSION: 1. No acute intrathoracic process. Electronically Signed   By: Randa Ngo M.D.   On: 07/09/2020 23:45   CT Renal Stone Study  Result Date: 07/10/2020 CLINICAL DATA:  Urinary tract infection EXAM: CT ABDOMEN AND PELVIS WITHOUT CONTRAST TECHNIQUE: Multidetector CT imaging of the abdomen and pelvis was performed following the standard protocol without IV contrast. COMPARISON:  None. FINDINGS: LOWER CHEST: Normal. HEPATOBILIARY: Normal hepatic contours. No intra- or extrahepatic biliary dilatation. There is  cholelithiasis without acute inflammation. PANCREAS: Normal pancreas. No ductal dilatation or peripancreatic fluid collection. SPLEEN: Normal. ADRENALS/URINARY TRACT: The adrenal glands are normal. No hydronephrosis, nephroureterolithiasis or solid renal mass. There are 2 calcifications in the dependent portion of the urinary bladder, each measuring approximately 2 mm. STOMACH/BOWEL: There is no hiatal hernia. Normal duodenal course and caliber. No small bowel dilatation or inflammation. No focal colonic abnormality. The appendix is not visualized. No right lower quadrant inflammation or free fluid. VASCULAR/LYMPHATIC: There is calcific atherosclerosis of the abdominal aorta. No lymphadenopathy. REPRODUCTIVE: Enlarged prostate measures 5.5 cm in transverse dimension. MUSCULOSKELETAL. No bony spinal canal stenosis or focal osseous abnormality. OTHER: None. IMPRESSION: 1. No obstructive uropathy or nephroureterolithiasis. Two calcifications in the dependent portion of the urinary bladder may be sequela of recently resected bladder tumor. 2. Cholelithiasis without acute inflammation. 3. Enlarged prostate. Aortic Atherosclerosis (ICD10-I70.0). Electronically Signed   By: Ulyses Jarred M.D.   On: 07/10/2020 02:32   Telemetry    07/11/20 NSR - Personally Reviewed  ECG    No new tracing as of 07/11/20- Personally Reviewed  Cardiac Studies   Echocardiogram: See scanned documents in Epic   LHC: See scanned documents in Epic   Patient Profile     86 y.o. male with a hx of prior MI requiring RCA stenting in 2005, NSTEMI 02/2015 with second RCA stent placement with residual 50% LCx disease ([place]), aortic aneurysm s/p repair in 2007, dyslipidemia intolerant to statins, DM2, CKD and dementia who is being seen today for the evaluation of chest pain at the request of Dr. Hal Hope.  Assessment & Plan    1. Chest pain with known CAD s/p RCA PCI x2 (2005, 2016): -Reports intermittent exertional angina with  known residual CAD -LHC 07/20/2006 at which time an RCA stent was placed with repeat cardiac catheterization 03/14/2015 with RCA stenting x2 and moderate LCx disease at 50% stenosis. Echocardiogram 09/06/2019 with normal LVEF and G1 DD. -EKG with mild ST depressions/TWI in leads V4-V5 however subsequent tracing performed today shows more pronounced TWI in both V4 and V6 leads.  -hsT  noted to be 12>>>12>>297>>>2938 -Maintained on home PTA ASA 81, Plavix 75, Toprol-XL 25 -Statin intolerant -Given significant rise in hsT>>>certainly concern about progressive CAD>>will need LHC while hospitalized>>>perferrably today or Monday -Long-acting nitrates added to regimen  -Continue current dose of Toprol to 12.5mg  PO QD and Imdur at 15mg  QD  -Continue ASA, Plavix   2. UTI with recent post-operative indwelling catheter: -Pt presented with generalized weakness, rigors and difficulty with ambulation. He has been recently followed by urology for hematuria and urinary retention for which he underwent a cystoscopy for a 7 x 4 cm bladder tumor. Following his procedure, a Foley left in place.  He was seen by urology 07/08/2020 at which time the Foley catheter was removed with instructions to self cath at home.  He was also being treated empirically with antibiotic therapy for possible UTI.  On 07/09/2020, patient began having fevers, chills and difficulty with ambulation along with abdominal pain.  Given his history, he presented to the ED for further evaluation. -On presentation, WBC found to be elevated at 22.2 with a K+ at 6.3>>WBC improved slightly at 17.5  -Creatinine stable at 1.00 -UA positive for UTI -Renal stone CT performed which showed no obstructive uropathy or nephroureterolithiasis however few calcifications in the dependent portion of the urinary bladder felt to be sequela of recently resected bladder tumor, cholelithiasis without acute inflammation and enlarged prostate.  3.  Hypertension: -Stable,  117/64, 112/62, 113/58 -Continue Toprol to 12.5mg  PO QD  -Continue Imdur 15mg    4.  Hyperlipidemia: -Scanned lab work from 06/25/2019 shows an LDL at 21 -Not currently on statin therapy secondary to intolerance -Could consider referral to outpatient lipid clinic for possible PCSK9 inhibitor  5.  History of AAA disease with repair: -Stable, no new symptoms  -BP well controlled   Signed, Kathyrn Drown NP-C [facility] Pager: [phone] 07/11/2020, 7:37 AM     For questions or updates, please contact   Please consult [email or web address] for contact info under Cardiology/STEMI.  I have seen and examined the patient along with Kathyrn Drown NP-C.  I have reviewed the chart, notes and new data.  I agree with PA/NP's note.  Key new complaints: no angina at rest, eager to walk around, feels confined in the ED room. His wife tells me that for at least the last 6 months he has had a predictable pattern of transient chest pressure with moderate activity, promptly relieved by rest Key examination changes: normal  CV exam Key new findings / data: marked further increase in hs troponin I  PLAN: He had a small demand myocardial infarction, during an episode of urosepsis, on a background of CCS class 2 exertional angina. It is highly likely that he has a high grade stenosis in the LAD and/or large ramus intermedius artery that was described at his last cath in [place] (scan labeled in Epic as Right Heart Cath). Currently asymptomatic and appears to be recovering rapidly from the urinary infection. Plan for cardiac cath on Monday. He is an appropriate candidate for PCI-stent, but the threshold for recommending CABG would be high in view of his age and early cognitive issues.This procedure has been fully reviewed with the patient and informed consent has been obtained. Also need to address lipid lowering: lots of muscle complaints led to cessation of statin. Discussed PCSK9 inhibitors.  Sanda Klein, MD,  [place] [phone] 07/11/2020, 12:31 PM

## 2020-07-12 DIAGNOSIS — I10 Essential (primary) hypertension: Secondary | ICD-10-CM

## 2020-07-12 DIAGNOSIS — R079 Chest pain, unspecified: Secondary | ICD-10-CM

## 2020-07-12 LAB — BASIC METABOLIC PANEL
Anion gap: 9 (ref 5–15)
BUN: 21 mg/dL (ref 8–23)
CO2: 22 mmol/L (ref 22–32)
Calcium: 8 mg/dL — ABNORMAL LOW (ref 8.9–10.3)
Chloride: 108 mmol/L (ref 98–111)
Creatinine, Ser: 0.94 mg/dL (ref 0.61–1.24)
GFR calc Af Amer: >60 mL/min (ref >60)
GFR calc non Af Amer: >60 mL/min (ref >60)
Glucose, Bld: 131 mg/dL — ABNORMAL HIGH (ref 70–99)
Potassium: 4 mmol/L (ref 3.5–5.1)
Sodium: 139 mmol/L (ref 135–145)

## 2020-07-12 LAB — CBC WITH DIFFERENTIAL/PLATELET
Abs Immature Granulocytes: 0.18 10*3/uL — ABNORMAL HIGH (ref 0.00–0.07)
Basophils Absolute: 0 10*3/uL (ref 0.0–0.1)
Basophils Relative: 0 %
Eosinophils Absolute: 0.1 10*3/uL (ref 0.0–0.5)
Eosinophils Relative: 1 %
HCT: 30.7 % — ABNORMAL LOW (ref 39.0–52.0)
Hemoglobin: 9.9 g/dL — ABNORMAL LOW (ref 13.0–17.0)
Immature Granulocytes: 2 %
Lymphocytes Relative: 14 %
Lymphs Abs: 1.3 10*3/uL (ref 0.7–4.0)
MCH: 33.4 pg (ref 26.0–34.0)
MCHC: 32.2 g/dL (ref 30.0–36.0)
MCV: 103.7 fL — ABNORMAL HIGH (ref 80.0–100.0)
Monocytes Absolute: 0.8 10*3/uL (ref 0.1–1.0)
Monocytes Relative: 8 %
Neutro Abs: 7.3 10*3/uL (ref 1.7–7.7)
Neutrophils Relative %: 75 %
Platelets: 144 10*3/uL — ABNORMAL LOW (ref 150–400)
RBC: 2.96 MIL/uL — ABNORMAL LOW (ref 4.22–5.81)
RDW: 14.4 % (ref 11.5–15.5)
WBC: 9.6 10*3/uL (ref 4.0–10.5)
nRBC: 0.2 % (ref 0.0–0.2)

## 2020-07-12 MED ORDER — CHLORHEXIDINE GLUCONATE CLOTH 2 % EX PADS
6.0000 | MEDICATED_PAD | Freq: Every day | CUTANEOUS | Status: DC
  Administered 2020-07-12 – 2020-07-13 (×2): 6 via TOPICAL

## 2020-07-12 NOTE — Evaluation (Signed)
Physical Therapy Evaluation Patient Details Name: Melissa Pulido MRN: [id number] DOB: Nov 04, 1926 Today's Date: 07/12/2020   History of Present Illness  Pt is 86 yo male with PMH of MI with multiple stents, aortic aneurysm with repair, dyslipidemia, DM2, CKD, and dementia who was admitted with chest pain, NSTEMI, and UTI.  At this time pt no longer has chest pain and plan is for heart cath on 07/14/20.  Clinical Impression  Pt admitted with above diagnosis. Pt is extremely pleasant and normally active and independent.  He and his wife walk daily in their neighborhood and he played tennis up until a year ago.  He does have mild confusion with hx of dementia.  Pt was eager to work with therapy.  He was able to ambulate 46' but had DOE of 3/4 and had to return to sitting position to recover.  VSS and no chest pain.  He required cues for safety and min guard/assist for steadying.  May benefit from [place] and HHPT at d/c pending progress.  Also, depending on plan after heart cath - may need further assessment if requires surgery. Pt currently with functional limitations due to the deficits listed below (see PT Problem List). Pt will benefit from skilled PT to increase their independence and safety with mobility to allow discharge to the venue listed below.       Follow Up Recommendations Home health PT;Supervision/Assistance - 24 hour (may need reassessment pending plan after heart cath)    Equipment Recommendations  Rolling walker with 5" wheels    Recommendations for Other Services       Precautions / Restrictions Precautions Precautions: Fall Restrictions Weight Bearing Restrictions: No      Mobility  Bed Mobility Overal bed mobility: Needs Assistance Bed Mobility: Supine to Sit     Supine to sit: Min guard;HOB elevated     General bed mobility comments: increased time  Transfers Overall transfer level: Needs assistance Equipment used: None Transfers: Sit to/from Stand Sit to  Stand: Min assist         General transfer comment: Min A with initial standing to steady; performed x 3  Ambulation/Gait Ambulation/Gait assistance: Min guard Gait Distance (Feet): 80 Feet Assistive device: IV Pole;None Gait Pattern/deviations: Step-through pattern;Decreased stride length;Shuffle Gait velocity: decreased   General Gait Details: mild unsteadiness and as he fatigued he used IV pole; see below for vitals Min cues for posture and increased step length with gait  Stairs            Wheelchair Mobility    Modified Rankin (Stroke Patients Only)       Balance Overall balance assessment: Needs assistance Sitting-balance support: No upper extremity supported Sitting balance-Leahy Scale: Good     Standing balance support: No upper extremity supported;Single extremity supported Standing balance-Leahy Scale: Fair Standing balance comment: mild unsteadiness    Required cues for safety with balance - attempting to put shoes on in standing - recommended to do in sitting                         Pertinent Vitals/Pain Pain Assessment: No/denies pain    Home Living Family/patient expects to be discharged to:: Private residence Living Arrangements: Spouse/significant other Available Help at Discharge: Family;Available 24 hours/day Type of Home: House Home Access: Stairs to enter   [employer] of Steps: threshold Home Layout: One level Home Equipment: Shower seat - built in      Prior Function Level of Independence: Independent  Comments: up until a year ago still played tennis; still walks in community daily     Hand Dominance        Extremity/Trunk Assessment   Upper Extremity Assessment Upper Extremity Assessment: Overall WFL for tasks assessed    Lower Extremity Assessment Lower Extremity Assessment: Overall WFL for tasks assessed    Cervical / Trunk Assessment Cervical / Trunk Assessment: Normal   Communication   Communication: No difficulties  Cognition Arousal/Alertness: Awake/alert Behavior During Therapy: WFL for tasks assessed/performed Overall Cognitive Status: History of cognitive impairments - at baseline                                 General Comments: Pt with hx of dementia.  He was very pleasant and tended to hide confusion (defered home questions to wife, stated "I have no need to keep up with the date or year.")      General Comments General comments (skin integrity, edema, etc.): HR 95-100 bpm; O2 sats >95% on RA; DOE 3/4 with ambulation but improved with 30 seconds rest    Exercises     Assessment/Plan    PT Assessment Patient needs continued PT services  PT Problem List Decreased strength;Decreased mobility;Decreased safety awareness;Decreased range of motion;Decreased activity tolerance;Decreased balance;Decreased knowledge of use of DME;Decreased cognition;Cardiopulmonary status limiting activity;Decreased coordination       PT Treatment Interventions DME instruction;Therapeutic activities;Gait training;Therapeutic exercise;Patient/family education;Stair training;Balance training;Functional mobility training;Cognitive remediation    PT Goals (Current goals can be found in the Care Plan section)  Acute Rehab PT Goals Patient Stated Goal: return home; wife reports does not want SNF PT Goal Formulation: With patient/family Time For Goal Achievement: 07/26/20 Potential to Achieve Goals: Good    Frequency Min 3X/week   Barriers to discharge        Co-evaluation               AM-PAC PT "6 Clicks" Mobility  Outcome Measure Help needed turning from your back to your side while in a flat bed without using bedrails?: A Little Help needed moving from lying on your back to sitting on the side of a flat bed without using bedrails?: A Little Help needed moving to and from a bed to a chair (including a wheelchair)?: A Little Help needed  standing up from a chair using your arms (e.g., wheelchair or bedside chair)?: A Little Help needed to walk in hospital room?: A Little Help needed climbing 3-5 steps with a railing? : A Little 6 Click Score: 18    End of Session Equipment Utilized During Treatment: Gait belt Activity Tolerance: Patient tolerated treatment well Patient left: in chair;with call bell/phone within reach;with family/visitor present (wife reports she will not leave) Nurse Communication: Mobility status;Other (comment) (daily ambulation with nursing) PT Visit Diagnosis: Unsteadiness on feet (R26.81);Other abnormalities of gait and mobility (R26.89)    Time: 8756-4332 PT Time Calculation (min) (ACUTE ONLY): 23 min   Charges:   PT Evaluation $PT Eval Low Complexity: 1 Low PT Treatments $Gait Training: 8-22 mins        Abran Ithan, PT Acute Rehab Services Pager [phone] [facility] [place] 07/12/2020, 9:58 AM

## 2020-07-12 NOTE — Progress Notes (Signed)
Progress Note  Patient Name: Ian Harrison Date of Encounter: 07/12/2020  Primary Cardiologist: Berniece Salines, DO   Subjective   Doing well. Got a room upstairs yesterday evening. Wife at bedside. No chest pain this morning. Urinary retention overnight requiring in/out cath.   Inpatient Medications    Scheduled Meds:  aspirin EC  81 mg Oral Q breakfast   Chlorhexidine Gluconate Cloth  6 each Topical Daily   clopidogrel  75 mg Oral Q breakfast   docusate sodium  100 mg Oral Daily   enoxaparin (LOVENOX) injection  40 mg Subcutaneous Q24H   isosorbide mononitrate  15 mg Oral Daily   metoprolol succinate  12.5 mg Oral Daily   sodium chloride flush  3 mL Intravenous Q12H   tamsulosin  0.4 mg Oral QHS   Continuous Infusions:  sodium chloride 100 mL/hr at 07/12/20 0123   cefTRIAXone (ROCEPHIN)  IV 1 g (07/12/20 0124)   PRN Meds: nitroGLYCERIN, ondansetron **OR** ondansetron (ZOFRAN) IV, traMADol   Vital Signs    Vitals:   07/11/20 2139 07/12/20 0207 07/12/20 0601 07/12/20 0730  BP: 121/66 112/61 112/62 127/70  Pulse: 72 70 68 77  Resp: 16 18 20  (!) 21  Temp: 98.1 F (36.7 C) 98 F (36.7 C) 97.9 F (36.6 C) 99.5 F (37.5 C)  TempSrc: Oral Oral Oral Oral  SpO2: 98% 95% 96% 96%  Weight:   84.6 kg   Height:        Intake/Output Summary (Last 24 hours) at 07/12/2020 0809 Last data filed at 07/12/2020 0601 Gross per 24 hour  Intake 1592.03 ml  Output 730 ml  Net 862.03 ml   Filed Weights   07/10/20 0749 07/12/20 0601  Weight: 83 kg 84.6 kg    Telemetry    Sinus rhythm - Personally Reviewed  ECG    n/a  Physical Exam   GEN: No acute distress.   Neck: No JVD Cardiac: RRR, no murmurs, rubs, or gallops.  Respiratory: Clear to auscultation bilaterally. GI: Soft, nontender, non-distended  MS: No edema; No deformity. Neuro:  Nonfocal  Psych: Normal affect   Labs    Chemistry Recent Labs  Lab 07/09/20 2306 07/10/20 0012 07/10/20 1008  07/11/20 0420 07/12/20 0551  NA 133*   < > 135 137 139  K 6.3*   < > 3.8 4.0 4.0  CL 100   < > 103 104 108  CO2 23   < > 23 24 22   GLUCOSE 135*   < > 156* 132* 131*  BUN 22   < > 22 20 21   CREATININE 1.03   < > 0.96 1.00 0.94  CALCIUM 8.9   < > 8.5* 8.3* 8.0*  PROT 7.7  --  6.5  --   --   ALBUMIN 4.0  --  3.4*  --   --   AST 28  --  16  --   --   ALT 13  --  14  --   --   ALKPHOS 45  --  39  --   --   BILITOT 2.0*  --  1.4*  --   --   GFRNONAA >60   < > >60 >60 >60  GFRAA >60   < > >60 >60 >60  ANIONGAP 10   < > 9 9 9    < > = values in this interval not displayed.     Hematology Recent Labs  Lab 07/10/20 1008 07/11/20 0420 07/12/20 0551  WBC 23.3*  17.5* 9.6  RBC 3.13* 3.00* 2.96*  HGB 10.5* 10.0* 9.9*  HCT 31.4* 30.7* 30.7*  MCV 100.3* 102.3* 103.7*  MCH 33.5 33.3 33.4  MCHC 33.4 32.6 32.2  RDW 14.3 14.2 14.4  PLT 162 145* 144*    Cardiac EnzymesNo results for input(s): TROPONINI in the last 168 hours. No results for input(s): TROPIPOC in the last 168 hours.   BNP Recent Labs  Lab 07/10/20 1544  BNP 616.4*     DDimer No results for input(s): DDIMER in the last 168 hours.   Radiology    No results found.  Cardiac Studies   Awaiting LHC Monday  Patient Profile     86 y.o. male with a hx of prior MI requiring RCA stenting in 2005, NSTEMI 02/2015 with second RCA stent placement with residual 50% LCx disease([place]), aortic aneurysms/prepair in 2007, dyslipidemia intolerant to statins, DM2, CKD and dementiawho was admitted with chest pain and NSTEMI.   Assessment & Plan    1. NSTEMI in setting of known CAD s/p RCA PCI x 2 in 2005 and 2016 Chest pain free. Electrically stable. Hemodynamically stable. - cont home ASA 81, clopidogrel 75, toprol xl 25 - plan for [facility] Monday. NPO Sunday night  2. UTI with recent indwelling catheter Overnight with decreased UOP requiring straight cath. - ctx per primary team   3. HTN Stable Cont toprol and  imdur  4. HLD - lipid clinic referral for ? PCSK9 inhibitor therapy given known CAD and statin intolerance  5. AAA s/prepair BP control as above      For questions or updates, please contact [place] Please consult [email or web address] for contact info under Cardiology/STEMI.      Lysbeth Galas T. Quentin Ore, MD, [facility] Cardiac Electrophysiology

## 2020-07-12 NOTE — Progress Notes (Signed)
PROGRESS NOTE    Ian Harrison  WSF:[id number] DOB: 1927-10-16 DOA: 07/09/2020 PCP: Mackie Pai, PA-C    Chief Complaint  Patient presents with   Weakness    Brief Narrative:  Chief Complaint: Weakness.  HPI: Ian Harrison is a 86 y.o. male with history of mild dementia, CAD status post stenting,  Chronic diastolic chf, aortic aneurysm s/p repair in 2007 who was recently followed up by urologist for hematuria and urinary retention underwent cystoscopy on June 23, 2020 following which patient's catheter was removed.  Patient had followed up with urologist yesterday and had been empirically started on antibiotic for possible UTI.  Patient also was thought how to self cath but had not done it because patient was able to urinate without any help.  At home patient feels suddenly having rigors and felt weak following which patient found it difficult to walk.  ED Course: In the ER patient did not have any focal weakness.  UA is consistent with UTI.  Labs show significant leukocytosis of 22.2 potassium of 6.3 creatinine was normal.  Covid test was negative.  While in the ER patient started having chest pressure off and on.  EKG shows slightly T wave and ST depression.  Chest x-ray was unremarkable Covid test is negative troponins are negative.  Patient started on empiric antibiotics and admitted for UTI generalized weakness and chest pain.  Patient also was given D50 insulin and sodium bicarb of Lokelma for hyperkalemia.  Subjective:  Developed urinary retention overnight, required in and out cath  Then placed indwelling catheter He denies pain, he has poor memory but pleasant and appropriate  Wife at bedside   Assessment & Plan:   Principal Problem:   Acute lower UTI Active Problems:   Coronary artery disease involving native coronary artery of native heart without angina pectoris   Essential hypertension   AAA (abdominal aortic aneurysm) without rupture ([place])   Dementia  without behavioral disturbance (HCC)   Generalized weakness   Chest pain   Hyperkalemia   Diabetes mellitus type II, non insulin dependent (HCC)   Chronic kidney disease   Chronic diastolic CHF (congestive heart failure) (HCC)   Severe sepsis - POA UTI associated with recent indwelling catheter and instrumentation, urinary retention -covid19 screening negative, blood culture negative, urine culture with multiple species ( he was taking oral abx at home) -improving on rocephin, will continue -continue flomax, in and out cath  NSTEMI with h/o CAD s/p RCA PCI x 2 in 2005 and 2016 -With exertional chest pain, relieved by SLNTG or rest -dyslipidemia intolerant to statins -asa, plavix, toprol-xl, imdur (new) -plan LHC on Monday, NPO Sunday night -cardiology consulted, input appreciated  Mild thrombocytopenia Likely due to sepsis monitor  Hyperkalemia:  -k6.5 on presentation, Treated with Lokelma. Potassium nromalized  CKDII with anemia of chronic disease Cr and hgb at baseline  HTN:  toprolxl and imdur aortic aneurysm s/p repair in 2007,   Prediabetes: a1c 6, fasting blood glucose 131 Diet control   Dementia without behavioral disturbance  -At baseline, relatively alert and interactive appropriately  Generalized weakness:  - PT eval.   DVT prophylaxis: enoxaparin (LOVENOX) injection 40 mg Start: 07/10/20 1000   Code Status:full Family Communication: wife Disposition:   Status is: Inpatient  Dispo: The patient is from: home              Anticipated d/c is to: home, may need home health  Anticipated d/c date is: pending cardiac cath                Consultants:   cards  Procedures:   In and out urinary cath, then indwelling catheter  Antimicrobials:   riocephin     Objective: Vitals:   07/11/20 1351 07/11/20 2139 07/12/20 0207 07/12/20 0601  BP: (!) 115/55 121/66 112/61 112/62  Pulse: 74 72 70 68  Resp: 20 16 18 20   Temp: 97.6 F  (36.4 C) 98.1 F (36.7 C) 98 F (36.7 C) 97.9 F (36.6 C)  TempSrc: Oral Oral Oral Oral  SpO2: 96% 98% 95% 96%  Weight:    84.6 kg  Height:        Intake/Output Summary (Last 24 hours) at 07/12/2020 0720 Last data filed at 07/12/2020 0601 Gross per 24 hour  Intake 1692.03 ml  Output 730 ml  Net 962.03 ml   Filed Weights   07/10/20 0749 07/12/20 0601  Weight: 83 kg 84.6 kg    Examination:  General exam: calm, NAD, poor memory but pleasant and appropriate , +indewlling foley Respiratory system: Clear to auscultation. Respiratory effort normal. Cardiovascular system: S1 & S2 heard, RRR. No JVD, no murmur, No pedal edema. Gastrointestinal system: Abdomen is nondistended, soft and nontender. No organomegaly or masses felt. Normal bowel sounds heard. Central nervous system: Alert and oriented. No focal neurological deficits. Extremities: Symmetric 5 x 5 power. Skin: No rashes, lesions or ulcers Psychiatry: Judgement and insight appear normal. Mood & affect appropriate.     Data Reviewed: I have personally reviewed following labs and imaging studies  CBC: Recent Labs  Lab 07/09/20 2306 07/10/20 0012 07/10/20 1008 07/11/20 0420 07/12/20 0551  WBC 22.2*  --  23.3* 17.5* 9.6  NEUTROABS 18.7*  --  20.3*  --  7.3  HGB 11.4* 12.2* 10.5* 10.0* 9.9*  HCT 35.8* 36.0* 31.4* 30.7* 30.7*  MCV 103.5*  --  100.3* 102.3* 103.7*  PLT 188  --  162 145* 144*    Basic Metabolic Panel: Recent Labs  Lab 07/09/20 2306 07/10/20 0012 07/10/20 1008 07/11/20 0420 07/12/20 0551  NA 133* 135 135 137 139  K 6.3* 6.5* 3.8 4.0 4.0  CL 100 102 103 104 108  CO2 23  --  23 24 22   GLUCOSE 135* 134* 156* 132* 131*  BUN 22 28* 22 20 21   CREATININE 1.03 1.00 0.96 1.00 0.94  CALCIUM 8.9  --  8.5* 8.3* 8.0*    GFR: Estimated Creatinine Clearance: 54.5 mL/min (by C-G formula based on SCr of 0.94 mg/dL).  Liver Function Tests: Recent Labs  Lab 07/09/20 2306 07/10/20 1008  AST 28 16  ALT  13 14  ALKPHOS 45 39  BILITOT 2.0* 1.4*  PROT 7.7 6.5  ALBUMIN 4.0 3.4*    CBG: Recent Labs  Lab 07/10/20 0258  GLUCAP 178*     Recent Results (from the past 240 hour(s))  SARS Coronavirus 2 by RT PCR (hospital order, performed in [facility] hospital lab) Nasopharyngeal Nasopharyngeal Swab     Status: None   Collection Time: 07/09/20 11:07 PM   Specimen: Nasopharyngeal Swab  Result Value Ref Range Status   SARS Coronavirus 2 NEGATIVE NEGATIVE Final    Comment: (NOTE) SARS-CoV-2 target nucleic acids are NOT DETECTED.  The SARS-CoV-2 RNA is generally detectable in upper and lower respiratory specimens during the acute phase of infection. The lowest concentration of SARS-CoV-2 viral copies this assay can detect is 250 copies / mL. A  negative result does not preclude SARS-CoV-2 infection and should not be used as the sole basis for treatment or other patient management decisions.  A negative result may occur with improper specimen collection / handling, submission of specimen other than nasopharyngeal swab, presence of viral mutation(s) within the areas targeted by this assay, and inadequate number of viral copies (<250 copies / mL). A negative result must be combined with clinical observations, patient history, and epidemiological information.  Fact Sheet for Patients:   StrictlyIdeas.no  Fact Sheet for Healthcare Providers: BankingDealers.co.za  This test is not yet approved or  cleared by the [place] FDA and has been authorized for detection and/or diagnosis of SARS-CoV-2 by FDA under an Emergency Use Authorization (EUA).  This EUA will remain in effect (meaning this test can be used) for the duration of the COVID-19 declaration under Section 564(b)(1) of the Act, 21 U.S.C. section 360bbb-3(b)(1), unless the authorization is terminated or revoked sooner.  Performed at [facility], [place] [address], [place], [place] [address]   Urine culture     Status: Abnormal   Collection Time: 07/10/20  1:02 AM   Specimen: Urine, Clean Catch  Result Value Ref Range Status   Specimen Description   Final    URINE, CLEAN CATCH Performed at [facility], [place] [address], [place], [place] [address]    Special Requests   Final    Normal Performed at [facility], [place] [address], [place], [place] [address]    Culture MULTIPLE SPECIES PRESENT, SUGGEST RECOLLECTION (A)  Final   Report Status 07/10/2020 FINAL  Final  Blood culture (routine x 2)     Status: None (Preliminary result)   Collection Time: 07/10/20  2:35 AM   Specimen: BLOOD  Result Value Ref Range Status   Specimen Description   Final    BLOOD RIGHT ANTECUBITAL Performed at [place] [address], [place], [place] [address]    Special Requests   Final    BOTTLES DRAWN AEROBIC AND ANAEROBIC Blood Culture adequate volume Performed at [place] [address], [place], [place] [address]    Culture   Final    NO GROWTH 1 DAY Performed at [facility], [place] [address] [place],  [address]    Report Status PENDING  Incomplete         Radiology Studies: No results found.      Scheduled Meds:  aspirin EC  81 mg Oral Q breakfast   Chlorhexidine Gluconate Cloth  6 each Topical Daily   clopidogrel  75 mg Oral Q breakfast   docusate sodium  100 mg Oral Daily   enoxaparin (LOVENOX) injection  40 mg Subcutaneous Q24H   isosorbide mononitrate  15 mg Oral Daily   metoprolol succinate  12.5 mg Oral Daily   sodium chloride flush  3 mL Intravenous Q12H   tamsulosin  0.4 mg Oral QHS   Continuous Infusions:  sodium chloride 100 mL/hr at 07/12/20 0123   cefTRIAXone (ROCEPHIN)  IV 1 g (07/12/20 0124)     LOS: 1 day   Time spent: 58mins Greater than 50% of this time was spent in counseling, explanation of diagnosis, planning  of further management, and coordination of care.  I have personally reviewed and interpreted on  07/12/2020 daily labs, tele strips, imagings as discussed above under date review session and assessment and plans.  I reviewed all nursing notes, pharmacy notes, consultant notes,  vitals, pertinent old records  I have discussed plan of care as described above with RN , patient and family on 07/12/2020  Voice Recognition /Dragon dictation system was used to create this note, attempts have been made to correct errors. Please contact the author with questions and/or clarifications.   Florencia Reasons, MD PhD FACP [facility]  Available via Epic secure chat 7am-7pm for nonurgent issues Please page for urgent issues To page the attending provider between 7A-7P or the covering provider during after hours 7P-7A, please log into the web site [email or web address] and access using universal [place] password for that web site. If you do not have the password, please call the hospital operator.    07/12/2020, 7:20 AM

## 2020-07-12 NOTE — Progress Notes (Addendum)
Pt c/o abdominal pain and discomfort. Pt has primo fit with only 50 ml output for shift. Performed bladder scan. 537 ml in bladder. Notified Dr. Kennon Holter. New orders for in and out cath received and carried out. 375 ml removed from bladder with visible clots. Post cath residual of 305 ml. Dr. Kennon Holter notified awaiting new order.  Avira Tillison,RN

## 2020-07-13 LAB — CBC WITH DIFFERENTIAL/PLATELET
Abs Immature Granulocytes: 0.45 10*3/uL — ABNORMAL HIGH (ref 0.00–0.07)
Basophils Absolute: 0.1 10*3/uL (ref 0.0–0.1)
Basophils Relative: 1 %
Eosinophils Absolute: 0.2 10*3/uL (ref 0.0–0.5)
Eosinophils Relative: 3 %
HCT: 31.1 % — ABNORMAL LOW (ref 39.0–52.0)
Hemoglobin: 10 g/dL — ABNORMAL LOW (ref 13.0–17.0)
Immature Granulocytes: 6 %
Lymphocytes Relative: 21 %
Lymphs Abs: 1.6 10*3/uL (ref 0.7–4.0)
MCH: 33.3 pg (ref 26.0–34.0)
MCHC: 32.2 g/dL (ref 30.0–36.0)
MCV: 103.7 fL — ABNORMAL HIGH (ref 80.0–100.0)
Monocytes Absolute: 0.7 10*3/uL (ref 0.1–1.0)
Monocytes Relative: 9 %
Neutro Abs: 4.8 10*3/uL (ref 1.7–7.7)
Neutrophils Relative %: 60 %
Platelets: 169 10*3/uL (ref 150–400)
RBC: 3 MIL/uL — ABNORMAL LOW (ref 4.22–5.81)
RDW: 14.3 % (ref 11.5–15.5)
WBC: 7.8 10*3/uL (ref 4.0–10.5)
nRBC: 0.3 % — ABNORMAL HIGH (ref 0.0–0.2)

## 2020-07-13 LAB — BASIC METABOLIC PANEL
Anion gap: 11 (ref 5–15)
BUN: 16 mg/dL (ref 8–23)
CO2: 22 mmol/L (ref 22–32)
Calcium: 8 mg/dL — ABNORMAL LOW (ref 8.9–10.3)
Chloride: 104 mmol/L (ref 98–111)
Creatinine, Ser: 0.71 mg/dL (ref 0.61–1.24)
GFR calc Af Amer: >60 mL/min (ref >60)
GFR calc non Af Amer: >60 mL/min (ref >60)
Glucose, Bld: 130 mg/dL — ABNORMAL HIGH (ref 70–99)
Potassium: 3.6 mmol/L (ref 3.5–5.1)
Sodium: 137 mmol/L (ref 135–145)

## 2020-07-13 LAB — MAGNESIUM: Magnesium: 2.2 mg/dL (ref 1.7–2.4)

## 2020-07-13 MED ORDER — SODIUM CHLORIDE 0.9 % WEIGHT BASED INFUSION: 3.0000 mL/kg/h | INTRAVENOUS | Status: DC

## 2020-07-13 MED ORDER — SODIUM CHLORIDE 0.9 % IV SOLN: 250.0000 mL | INTRAVENOUS | Status: DC | PRN

## 2020-07-13 MED ORDER — SODIUM CHLORIDE 0.9 % WEIGHT BASED INFUSION: 1.0000 mL/kg/h | INTRAVENOUS | Status: DC

## 2020-07-13 MED ORDER — SODIUM CHLORIDE 0.9% FLUSH: 3.0000 mL | INTRAVENOUS | Status: DC | PRN

## 2020-07-13 MED ORDER — SODIUM CHLORIDE 0.9 % IV SOLN: INTRAVENOUS | Status: DC

## 2020-07-13 NOTE — Plan of Care (Signed)
  Problem: Education: Goal: Knowledge of General Education information will improve Description: Including pain rating scale, medication(s)/side effects and non-pharmacologic comfort measures Outcome: Progressing   Problem: Activity: Goal: Risk for activity intolerance will decrease Outcome: Progressing   Problem: Pain Managment: Goal: General experience of comfort will improve Outcome: Progressing   

## 2020-07-13 NOTE — Progress Notes (Signed)
PROGRESS NOTE    Steven Basso  SNK:[id number] DOB: 1927-07-24 DOA: 07/09/2020 PCP: Mackie Pai, PA-C    Chief Complaint  Patient presents with  . Weakness    Brief Narrative:  Chief Complaint: Weakness.  HPI: Michaelpaul Apo is a 86 y.o. male with history of mild dementia, CAD status post stenting,  Chronic diastolic chf, aortic aneurysm s/p repair in 2007 who was recently followed up by urologist for hematuria and urinary retention underwent cystoscopy on June 23, 2020 following which patient's catheter was removed.  Patient had followed up with urologist yesterday and had been empirically started on antibiotic for possible UTI.  Patient also was thought how to self cath but had not done it because patient was able to urinate without any help.  At home patient feels suddenly having rigors and felt weak following which patient found it difficult to walk.  ED Course: In the ER patient did not have any focal weakness.  UA is consistent with UTI.  Labs show significant leukocytosis of 22.2 potassium of 6.3 creatinine was normal.  Covid test was negative.  While in the ER patient started having chest pressure off and on.  EKG shows slightly T wave and ST depression.  Chest x-ray was unremarkable Covid test is negative troponins are negative.  Patient started on empiric antibiotics and admitted for UTI generalized weakness and chest pain.  Patient also was given D50 insulin and sodium bicarb of Lokelma for hyperkalemia.  Subjective:  Report a brief episode of angina while ambulating, resolved with resting He is currently sitting up in chair ,reading newspaper, no acute complaints  indwelling catheter in place,   he has poor memory but pleasant and appropriate  Wife at bedside   Assessment & Plan:   Principal Problem:   Acute lower UTI Active Problems:   Coronary artery disease involving native coronary artery of native heart without angina pectoris   Essential hypertension    AAA (abdominal aortic aneurysm) without rupture ([place])   Dementia without behavioral disturbance (HCC)   Generalized weakness   Chest pain   Hyperkalemia   Diabetes mellitus type II, non insulin dependent (HCC)   Chronic kidney disease   Chronic diastolic CHF (congestive heart failure) ([place])   Severe sepsis - POA UTI associated with recent indwelling catheter and instrumentation,  Recurrent urinary retention required new foley placed in on 9/17-9/18 night  -Presented with fever at home, chills and weakness, wbc 23 , lactic acid 2.2 on presentation, -covid19 screening negative, blood culture negative, urine culture with multiple species ( he was taking oral abx at home) -improving on rocephin 4/7 , plan to transition to oral abx after cardiac cath -continue flomax, voiding trial after cardiac cath, if fail voiding trial ,then needs to discharge on foley and follow up with urology   NSTEMI with h/o CAD s/p RCA PCI x 2 in 2005 and 2016 -With exertional chest pain, relieved by SLNTG or rest -dyslipidemia intolerant to statins -asa, plavix, toprol-xl, imdur (new) -plan LHC on Monday, NPO Sunday night -cardiology consulted, input appreciated  Mild thrombocytopenia Likely due to sepsis plt [phone]  Hyperkalemia:  -k6.5 on presentation, Treated with Lokelma. Potassium nromalized  CKDII with anemia of chronic disease Cr and hgb at baseline  HTN:  toprolxl and imdur  aortic aneurysm s/p repair in 2007,   Prediabetes: a1c 6, fasting blood glucose 131 Diet control   Dementia without behavioral disturbance  -At baseline, relatively alert and interactive appropriately  Generalized weakness:  - PT  recommend home health, transition care order placed    DVT prophylaxis: enoxaparin (LOVENOX) injection 40 mg Start: 07/10/20 1000   Code Status:full Family Communication: wife Disposition:   Status is: Inpatient  Dispo: The patient is from: home              Anticipated d/c  is to: home with home health              Anticipated d/c date is: pending cardiac cath                Consultants:   cards  Procedures:   In and out urinary cath, then indwelling catheter  Cardiac cath on 9/20  Antimicrobials:   rocephin     Objective: Vitals:   07/12/20 2222 07/13/20 0510 07/13/20 0835 07/13/20 0955  BP: 121/68 135/63  121/61  Pulse: 67 78  71  Resp: 20 18  18   Temp: 98.7 F (37.1 C) 98.4 F (36.9 C)  98.1 F (36.7 C)  TempSrc: Oral Oral  Oral  SpO2: 95% 97%  96%  Weight:   87.5 kg   Height:   5\' 11"  (1.803 m)     Intake/Output Summary (Last 24 hours) at 07/13/2020 1222 Last data filed at 07/13/2020 0959 Gross per 24 hour  Intake 2667.78 ml  Output 1050 ml  Net 1617.78 ml   Filed Weights   07/10/20 0749 07/12/20 0601 07/13/20 0835  Weight: 83 kg 84.6 kg 87.5 kg    Examination:  General exam: calm, NAD, poor memory but pleasant and appropriate , +indewlling foley Respiratory system: Clear to auscultation. Respiratory effort normal. Cardiovascular system: S1 & S2 heard, RRR. No JVD, no murmur, No pedal edema. Gastrointestinal system: Abdomen is nondistended, soft and nontender. No organomegaly or masses felt. Normal bowel sounds heard. Central nervous system: Alert and oriented. No focal neurological deficits. Extremities: Symmetric ,equal , no edema Skin: No rashes, lesions or ulcers Psychiatry: Judgement and insight appear normal. Mood & affect appropriate.     Data Reviewed: I have personally reviewed following labs and imaging studies  CBC: Recent Labs  Lab 07/09/20 2306 07/09/20 2306 07/10/20 0012 07/10/20 1008 07/11/20 0420 07/12/20 0551 07/13/20 0518  WBC 22.2*  --   --  23.3* 17.5* 9.6 7.8  NEUTROABS 18.7*  --   --  20.3*  --  7.3 4.8  HGB 11.4*   < > 12.2* 10.5* 10.0* 9.9* 10.0*  HCT 35.8*   < > 36.0* 31.4* 30.7* 30.7* 31.1*  MCV 103.5*  --   --  100.3* 102.3* 103.7* 103.7*  PLT 188  --   --  162 145* 144* 169   <  > = values in this interval not displayed.    Basic Metabolic Panel: Recent Labs  Lab 07/09/20 2306 07/09/20 2306 07/10/20 0012 07/10/20 1008 07/11/20 0420 07/12/20 0551 07/13/20 0518  NA 133*   < > 135 135 137 139 137  K 6.3*   < > 6.5* 3.8 4.0 4.0 3.6  CL 100   < > 102 103 104 108 104  CO2 23  --   --  23 24 22 22   GLUCOSE 135*   < > 134* 156* 132* 131* 130*  BUN 22   < > 28* 22 20 21 16   CREATININE 1.03   < > 1.00 0.96 1.00 0.94 0.71  CALCIUM 8.9  --   --  8.5* 8.3* 8.0* 8.0*  MG  --   --   --   --   --   --  2.2   < > = values in this interval not displayed.    GFR: Estimated Creatinine Clearance: 64.1 mL/min (by C-G formula based on SCr of 0.71 mg/dL).  Liver Function Tests: Recent Labs  Lab 07/09/20 2306 07/10/20 1008  AST 28 16  ALT 13 14  ALKPHOS 45 39  BILITOT 2.0* 1.4*  PROT 7.7 6.5  ALBUMIN 4.0 3.4*    CBG: Recent Labs  Lab 07/10/20 0258  GLUCAP 178*     Recent Results (from the past 240 hour(s))  SARS Coronavirus 2 by RT PCR (hospital order, performed in [facility] hospital lab) Nasopharyngeal Nasopharyngeal Swab     Status: None   Collection Time: 07/09/20 11:07 PM   Specimen: Nasopharyngeal Swab  Result Value Ref Range Status   SARS Coronavirus 2 NEGATIVE NEGATIVE Final    Comment: (NOTE) SARS-CoV-2 target nucleic acids are NOT DETECTED.  The SARS-CoV-2 RNA is generally detectable in upper and lower respiratory specimens during the acute phase of infection. The lowest concentration of SARS-CoV-2 viral copies this assay can detect is 250 copies / mL. A negative result does not preclude SARS-CoV-2 infection and should not be used as the sole basis for treatment or other patient management decisions.  A negative result may occur with improper specimen collection / handling, submission of specimen other than nasopharyngeal swab, presence of viral mutation(s) within the areas targeted by this assay, and inadequate number of viral  copies (<250 copies / mL). A negative result must be combined with clinical observations, patient history, and epidemiological information.  Fact Sheet for Patients:   StrictlyIdeas.no  Fact Sheet for Healthcare Providers: BankingDealers.co.za  This test is not yet approved or  cleared by the [place] FDA and has been authorized for detection and/or diagnosis of SARS-CoV-2 by FDA under an Emergency Use Authorization (EUA).  This EUA will remain in effect (meaning this test can be used) for the duration of the COVID-19 declaration under Section 564(b)(1) of the Act, 21 U.S.C. section 360bbb-3(b)(1), unless the authorization is terminated or revoked sooner.  Performed at [facility], [place] [address] [place], [place] [address]   Urine culture     Status: Abnormal   Collection Time: 07/10/20  1:02 AM   Specimen: Urine, Clean Catch  Result Value Ref Range Status   Specimen Description   Final    URINE, CLEAN CATCH Performed at [facility], [place] [address], [place], [place] [address]    Special Requests   Final    Normal Performed at [facility], [place] [address] [place], [place] [address]    Culture MULTIPLE SPECIES PRESENT, SUGGEST RECOLLECTION (A)  Final   Report Status 07/10/2020 FINAL  Final  Blood culture (routine x 2)     Status: None (Preliminary result)   Collection Time: 07/10/20  2:35 AM   Specimen: BLOOD  Result Value Ref Range Status   Specimen Description   Final    BLOOD RIGHT ANTECUBITAL Performed at [place] [address] [place], [place] [address]    Special Requests   Final    BOTTLES DRAWN AEROBIC AND ANAEROBIC Blood Culture adequate volume Performed at [place] [address] [place], [place] [address]    Culture   Final    NO GROWTH 2 DAYS Performed at [place] [address]  [place],  [address]    Report Status PENDING  Incomplete  Radiology Studies: No results found.      Scheduled Meds: . aspirin EC  81 mg Oral Q breakfast  . Chlorhexidine Gluconate Cloth  6 each Topical Daily  . clopidogrel  75 mg Oral Q breakfast  . docusate sodium  100 mg Oral Daily  . enoxaparin (LOVENOX) injection  40 mg Subcutaneous Q24H  . isosorbide mononitrate  15 mg Oral Daily  . metoprolol succinate  12.5 mg Oral Daily  . sodium chloride flush  3 mL Intravenous Q12H  . tamsulosin  0.4 mg Oral QHS   Continuous Infusions: . sodium chloride    . sodium chloride    . [START ON 07/14/2020] sodium chloride     Followed by  . [START ON 07/14/2020] sodium chloride    . cefTRIAXone (ROCEPHIN)  IV 1 g (07/13/20 0259)     LOS: 2 days   Time spent: 22mins Greater than 50% of this time was spent in counseling, explanation of diagnosis, planning of further management, and coordination of care.  I have personally reviewed and interpreted on  07/13/2020 daily labs, tele strips, imagings as discussed above under date review session and assessment and plans.  I reviewed all nursing notes, pharmacy notes, consultant notes,  vitals, pertinent old records  I have discussed plan of care as described above with RN , patient and family on 07/13/2020  Voice Recognition /Dragon dictation system was used to create this note, attempts have been made to correct errors. Please contact the author with questions and/or clarifications.   Florencia Reasons, MD PhD FACP [facility]  Available via Epic secure chat 7am-7pm for nonurgent issues Please page for urgent issues To page the attending provider between 7A-7P or the covering provider during after hours 7P-7A, please log into the web site [email or web address] and access using universal [place] password for that web site. If you do not have the password, please call the hospital operator.    07/13/2020, 12:22 PM

## 2020-07-13 NOTE — H&P (View-Only) (Signed)
Progress Note  Patient Name: Ian Harrison Date of Encounter: 07/13/2020  Primary Cardiologist: Berniece Salines, DO   Subjective   Doing well in chair this AM. Had foley placed last night due to elevated post void residuals. No further chest pain episodes.   Inpatient Medications    Scheduled Meds:  aspirin EC  81 mg Oral Q breakfast   Chlorhexidine Gluconate Cloth  6 each Topical Daily   clopidogrel  75 mg Oral Q breakfast   docusate sodium  100 mg Oral Daily   enoxaparin (LOVENOX) injection  40 mg Subcutaneous Q24H   isosorbide mononitrate  15 mg Oral Daily   metoprolol succinate  12.5 mg Oral Daily   sodium chloride flush  3 mL Intravenous Q12H   tamsulosin  0.4 mg Oral QHS   Continuous Infusions:  sodium chloride 100 mL/hr at 07/12/20 2256   cefTRIAXone (ROCEPHIN)  IV 1 g (07/13/20 0259)   PRN Meds: nitroGLYCERIN, ondansetron **OR** ondansetron (ZOFRAN) IV, traMADol   Vital Signs    Vitals:   07/12/20 0730 07/12/20 1311 07/12/20 2222 07/13/20 0510  BP: 127/70 109/65 121/68 135/63  Pulse: 77 77 67 78  Resp: (!) 21 (!) 21 20 18   Temp: 99.5 F (37.5 C) 98.5 F (36.9 C) 98.7 F (37.1 C) 98.4 F (36.9 C)  TempSrc: Oral  Oral Oral  SpO2: 96% 96% 95% 97%  Weight:      Height:        Intake/Output Summary (Last 24 hours) at 07/13/2020 0756 Last data filed at 07/12/2020 2348 Gross per 24 hour  Intake 2784.78 ml  Output 1050 ml  Net 1734.78 ml   Filed Weights   07/10/20 0749 07/12/20 0601  Weight: 83 kg 84.6 kg    Telemetry    Sinus rhythm - Personally Reviewed  ECG    n/a  Physical Exam   GEN: No acute distress.   Neck: No JVD Cardiac: RRR, no murmurs, rubs, or gallops.  Respiratory: Clear to auscultation bilaterally. GI: Soft, nontender, non-distended.  GU: foley in place. MS: No edema; No deformity. Neuro:  Nonfocal  Psych: Normal affect    Labs    Chemistry Recent Labs  Lab 07/09/20 2306 07/10/20 0012 07/10/20 1008  07/10/20 1008 07/11/20 0420 07/12/20 0551 07/13/20 0518  NA 133*   < > 135   < > 137 139 137  K 6.3*   < > 3.8   < > 4.0 4.0 3.6  CL 100   < > 103   < > 104 108 104  CO2 23   < > 23   < > 24 22 22   GLUCOSE 135*   < > 156*   < > 132* 131* 130*  BUN 22   < > 22   < > 20 21 16   CREATININE 1.03   < > 0.96   < > 1.00 0.94 0.71  CALCIUM 8.9   < > 8.5*   < > 8.3* 8.0* 8.0*  PROT 7.7  --  6.5  --   --   --   --   ALBUMIN 4.0  --  3.4*  --   --   --   --   AST 28  --  16  --   --   --   --   ALT 13  --  14  --   --   --   --   ALKPHOS 45  --  39  --   --   --   --  BILITOT 2.0*  --  1.4*  --   --   --   --   GFRNONAA >60   < > >60   < > >60 >60 >60  GFRAA >60   < > >60   < > >60 >60 >60  ANIONGAP 10   < > 9   < > 9 9 11    < > = values in this interval not displayed.     Hematology Recent Labs  Lab 07/11/20 0420 07/12/20 0551 07/13/20 0518  WBC 17.5* 9.6 7.8  RBC 3.00* 2.96* 3.00*  HGB 10.0* 9.9* 10.0*  HCT 30.7* 30.7* 31.1*  MCV 102.3* 103.7* 103.7*  MCH 33.3 33.4 33.3  MCHC 32.6 32.2 32.2  RDW 14.2 14.4 14.3  PLT 145* 144* 169    Cardiac EnzymesNo results for input(s): TROPONINI in the last 168 hours. No results for input(s): TROPIPOC in the last 168 hours.   BNP Recent Labs  Lab 07/10/20 1544  BNP 616.4*     DDimer No results for input(s): DDIMER in the last 168 hours.   Radiology    No results found.  Cardiac Studies   Awaiting LHC Monday  Patient Profile     86 y.o. male with a hx of prior MI requiring RCA stenting in 2005, NSTEMI 02/2015 with second RCA stent placement with residual 50% LCx disease([place]), aortic aneurysms/prepair in 2007, dyslipidemia intolerant to statins, DM2, CKD and dementiawho was admitted with chest pain and NSTEMI.   Assessment & Plan    1. NSTEMI in setting of known CAD s/p RCA PCI x 2 in 2005 and 2016 Chest pain free. Electrically stable. Hemodynamically stable. - cont home ASA 81, clopidogrel 75, toprol xl 25 - plan  for [facility] Monday. NPO Sunday night  2. UTI with recent indwelling catheter Foley replaced 9/18 due to retention - consider urology consult/outpatient referral - ctx per primary team   3. HTN Stable Cont toprol and imdur  4. HLD - lipid clinic referral for ? PCSK9 inhibitor therapy given known CAD and statin intolerance  5. AAA s/prepair BP control as above      For questions or updates, please contact [place] Please consult [email or web address] for contact info under Cardiology/STEMI.      Lysbeth Galas T. Quentin Ore, MD, [facility] Cardiac Electrophysiology

## 2020-07-13 NOTE — Progress Notes (Addendum)
Progress Note  Patient Name: Ian Harrison Date of Encounter: 07/13/2020  Primary Cardiologist: Berniece Salines, DO   Subjective   Doing well in chair this AM. Had foley placed last night due to elevated post void residuals. No further chest pain episodes.   Inpatient Medications    Scheduled Meds:  aspirin EC  81 mg Oral Q breakfast   Chlorhexidine Gluconate Cloth  6 each Topical Daily   clopidogrel  75 mg Oral Q breakfast   docusate sodium  100 mg Oral Daily   enoxaparin (LOVENOX) injection  40 mg Subcutaneous Q24H   isosorbide mononitrate  15 mg Oral Daily   metoprolol succinate  12.5 mg Oral Daily   sodium chloride flush  3 mL Intravenous Q12H   tamsulosin  0.4 mg Oral QHS   Continuous Infusions:  sodium chloride 100 mL/hr at 07/12/20 2256   cefTRIAXone (ROCEPHIN)  IV 1 g (07/13/20 0259)   PRN Meds: nitroGLYCERIN, ondansetron **OR** ondansetron (ZOFRAN) IV, traMADol   Vital Signs    Vitals:   07/12/20 0730 07/12/20 1311 07/12/20 2222 07/13/20 0510  BP: 127/70 109/65 121/68 135/63  Pulse: 77 77 67 78  Resp: (!) 21 (!) 21 20 18   Temp: 99.5 F (37.5 C) 98.5 F (36.9 C) 98.7 F (37.1 C) 98.4 F (36.9 C)  TempSrc: Oral  Oral Oral  SpO2: 96% 96% 95% 97%  Weight:      Height:        Intake/Output Summary (Last 24 hours) at 07/13/2020 0756 Last data filed at 07/12/2020 2348 Gross per 24 hour  Intake 2784.78 ml  Output 1050 ml  Net 1734.78 ml   Filed Weights   07/10/20 0749 07/12/20 0601  Weight: 83 kg 84.6 kg    Telemetry    Sinus rhythm - Personally Reviewed  ECG    n/a  Physical Exam   GEN: No acute distress.   Neck: No JVD Cardiac: RRR, no murmurs, rubs, or gallops.  Respiratory: Clear to auscultation bilaterally. GI: Soft, nontender, non-distended.  GU: foley in place. MS: No edema; No deformity. Neuro:  Nonfocal  Psych: Normal affect    Labs    Chemistry Recent Labs  Lab 07/09/20 2306 07/10/20 0012 07/10/20 1008 07/10/20 1008  07/11/20 0420 07/12/20 0551 07/13/20 0518  NA 133*   < > 135   < > 137 139 137  K 6.3*   < > 3.8   < > 4.0 4.0 3.6  CL 100   < > 103   < > 104 108 104  CO2 23   < > 23   < > 24 22 22   GLUCOSE 135*   < > 156*   < > 132* 131* 130*  BUN 22   < > 22   < > 20 21 16   CREATININE 1.03   < > 0.96   < > 1.00 0.94 0.71  CALCIUM 8.9   < > 8.5*   < > 8.3* 8.0* 8.0*  PROT 7.7  --  6.5  --   --   --   --   ALBUMIN 4.0  --  3.4*  --   --   --   --   AST 28  --  16  --   --   --   --   ALT 13  --  14  --   --   --   --   ALKPHOS 45  --  39  --   --   --   --  BILITOT 2.0*  --  1.4*  --   --   --   --   GFRNONAA >60   < > >60   < > >60 >60 >60  GFRAA >60   < > >60   < > >60 >60 >60  ANIONGAP 10   < > 9   < > 9 9 11    < > = values in this interval not displayed.     Hematology Recent Labs  Lab 07/11/20 0420 07/12/20 0551 07/13/20 0518  WBC 17.5* 9.6 7.8  RBC 3.00* 2.96* 3.00*  HGB 10.0* 9.9* 10.0*  HCT 30.7* 30.7* 31.1*  MCV 102.3* 103.7* 103.7*  MCH 33.3 33.4 33.3  MCHC 32.6 32.2 32.2  RDW 14.2 14.4 14.3  PLT 145* 144* 169    Cardiac EnzymesNo results for input(s): TROPONINI in the last 168 hours. No results for input(s): TROPIPOC in the last 168 hours.   BNP Recent Labs  Lab 07/10/20 1544  BNP 616.4*     DDimer No results for input(s): DDIMER in the last 168 hours.   Radiology    No results found.  Cardiac Studies   Awaiting LHC Monday  Patient Profile     86 y.o. male with a hx of prior MI requiring RCA stenting in 2005, NSTEMI 02/2015 with second RCA stent placement with residual 50% LCx disease ([place]), aortic aneurysm s/p repair in 2007, dyslipidemia intolerant to statins, DM2, CKD and dementia who was admitted with chest pain and NSTEMI.   Assessment & Plan    1. NSTEMI in setting of known CAD s/p RCA PCI x 2 in 2005 and 2016 Chest pain free. Electrically stable. Hemodynamically stable. - cont home ASA 81, clopidogrel 75, toprol xl 25 - plan for [facility] Monday.  NPO Sunday night  2. UTI with recent indwelling catheter Foley replaced 9/18 due to retention - consider urology consult/outpatient referral - ctx per primary team   3. HTN Stable Cont toprol and imdur  4. HLD - lipid clinic referral for ? PCSK9 inhibitor therapy given known CAD and statin intolerance  5. AAA s/prepair BP control as above      For questions or updates, please contact [place] Please consult [email or web address] for contact info under Cardiology/STEMI.      Lysbeth Galas T. Quentin Ore, MD, [facility] Cardiac Electrophysiology    ------------------------------------------------------------------------------------------ ADDENDUM 8:41 AM 07/15/20  NSTEMI has not been ruled out based on left heart cath. There is coronary disease on the left heart cath and the troponin elevation in setting of chest pain is consistent with NSTEMI.  Lysbeth Galas T. Quentin Ore, MD, [facility] Cardiac Electrophysiology

## 2020-07-13 NOTE — Progress Notes (Signed)
Patient is alert and oriented, but has some dementia. Patient has eaten well this shift. Patient continues to have a Foley catheter with urine being cloudy and having sediment. Patient had to have Peripheral IV in right AC replaced because the IV was leaking when it was being flushed. Patient has been calm and pleasant, as well as making jokes. Patient's wife continues to remain at bedside. Patient has got up twice this shift and ambulated in the hallway for a total of 254 feet. Patient tolerated the ambulation okay, but did have some slight angina while ambulating and resolved at rest. Patient will go NPO at midnight to get patient ready for left heart catheterization. Patient has been left sitting in the chair comfortably and eating his orange sherbet.

## 2020-07-14 ENCOUNTER — Telehealth: Payer: Self-pay | Admitting: Cardiology

## 2020-07-14 ENCOUNTER — Encounter (HOSPITAL_COMMUNITY)
Admission: EM | Disposition: A | Discharge status: Home Health | Payer: Self-pay | Source: Home / Self Care | Attending: Internal Medicine

## 2020-07-14 ENCOUNTER — Ambulatory Visit (HOSPITAL_COMMUNITY): Admission: RE | Admit: 2020-07-14 | Payer: Medicare Other | Source: Home / Self Care | Admitting: Cardiovascular Disease

## 2020-07-14 ENCOUNTER — Encounter (HOSPITAL_COMMUNITY): Payer: Self-pay | Admitting: Cardiovascular Disease

## 2020-07-14 DIAGNOSIS — N1832 Chronic kidney disease, stage 3b: Secondary | ICD-10-CM

## 2020-07-14 DIAGNOSIS — R778 Other specified abnormalities of plasma proteins: Secondary | ICD-10-CM

## 2020-07-14 HISTORY — PX: LEFT HEART CATH AND CORONARY ANGIOGRAPHY: CATH118249

## 2020-07-14 LAB — CBC WITH DIFFERENTIAL/PLATELET
Abs Immature Granulocytes: 0.6 10*3/uL — ABNORMAL HIGH (ref 0.00–0.07)
Basophils Absolute: 0.1 10*3/uL (ref 0.0–0.1)
Basophils Relative: 1 %
Eosinophils Absolute: 0.2 10*3/uL (ref 0.0–0.5)
Eosinophils Relative: 3 %
HCT: 29.1 % — ABNORMAL LOW (ref 39.0–52.0)
Hemoglobin: 9.4 g/dL — ABNORMAL LOW (ref 13.0–17.0)
Immature Granulocytes: 8 %
Lymphocytes Relative: 18 %
Lymphs Abs: 1.4 10*3/uL (ref 0.7–4.0)
MCH: 33.2 pg (ref 26.0–34.0)
MCHC: 32.3 g/dL (ref 30.0–36.0)
MCV: 102.8 fL — ABNORMAL HIGH (ref 80.0–100.0)
Monocytes Absolute: 0.7 10*3/uL (ref 0.1–1.0)
Monocytes Relative: 9 %
Neutro Abs: 4.5 10*3/uL (ref 1.7–7.7)
Neutrophils Relative %: 61 %
Platelets: 166 10*3/uL (ref 150–400)
RBC: 2.83 MIL/uL — ABNORMAL LOW (ref 4.22–5.81)
RDW: 14.1 % (ref 11.5–15.5)
WBC: 7.5 10*3/uL (ref 4.0–10.5)
nRBC: 0 % (ref 0.0–0.2)

## 2020-07-14 LAB — BASIC METABOLIC PANEL
Anion gap: 8 (ref 5–15)
BUN: 12 mg/dL (ref 8–23)
CO2: 22 mmol/L (ref 22–32)
Calcium: 7.6 mg/dL — ABNORMAL LOW (ref 8.9–10.3)
Chloride: 107 mmol/L (ref 98–111)
Creatinine, Ser: 0.69 mg/dL (ref 0.61–1.24)
GFR calc Af Amer: >60 mL/min (ref >60)
GFR calc non Af Amer: >60 mL/min (ref >60)
Glucose, Bld: 113 mg/dL — ABNORMAL HIGH (ref 70–99)
Potassium: 3.7 mmol/L (ref 3.5–5.1)
Sodium: 137 mmol/L (ref 135–145)

## 2020-07-14 SURGERY — LEFT HEART CATH AND CORONARY ANGIOGRAPHY
Anesthesia: LOCAL

## 2020-07-14 MED ORDER — SODIUM CHLORIDE 0.9% FLUSH
3.0000 mL | Freq: Two times a day (BID) | INTRAVENOUS | Status: DC
  Administered 2020-07-14 – 2020-07-16 (×3): 3 mL via INTRAVENOUS

## 2020-07-14 MED ORDER — HEPARIN SODIUM (PORCINE) 1000 UNIT/ML IJ SOLN
INTRAMUSCULAR | Status: AC
  Filled 2020-07-14: qty 1

## 2020-07-14 MED ORDER — VERAPAMIL HCL 2.5 MG/ML IV SOLN
INTRAVENOUS | Status: AC
  Filled 2020-07-14: qty 2

## 2020-07-14 MED ORDER — LABETALOL HCL 5 MG/ML IV SOLN: 10.0000 mg | INTRAVENOUS | Status: AC | PRN

## 2020-07-14 MED ORDER — ASPIRIN 81 MG PO CHEW: 81.0000 mg | CHEWABLE_TABLET | ORAL | Status: DC

## 2020-07-14 MED ORDER — SODIUM CHLORIDE 0.9 % IV SOLN
250.0000 mL | INTRAVENOUS | Status: DC | PRN
  Administered 2020-07-15: 250 mL via INTRAVENOUS

## 2020-07-14 MED ORDER — SODIUM CHLORIDE 0.9 % WEIGHT BASED INFUSION
3.0000 mL/kg/h | INTRAVENOUS | Status: DC
  Administered 2020-07-14: 3 mL/kg/h via INTRAVENOUS

## 2020-07-14 MED ORDER — ENOXAPARIN SODIUM 40 MG/0.4ML ~~LOC~~ SOLN
40.0000 mg | SUBCUTANEOUS | Status: DC
  Administered 2020-07-15: 40 mg via SUBCUTANEOUS
  Filled 2020-07-14: qty 0.4

## 2020-07-14 MED ORDER — SODIUM CHLORIDE 0.9 % WEIGHT BASED INFUSION
1.0000 mL/kg/h | INTRAVENOUS | Status: DC
  Administered 2020-07-14: 1 mL/kg/h via INTRAVENOUS

## 2020-07-14 MED ORDER — IOHEXOL 350 MG/ML SOLN
INTRAVENOUS | Status: DC | PRN
  Administered 2020-07-14: 50 mL

## 2020-07-14 MED ORDER — ASPIRIN EC 81 MG PO TBEC
81.0000 mg | DELAYED_RELEASE_TABLET | Freq: Every day | ORAL | Status: DC
  Administered 2020-07-15 – 2020-07-16 (×2): 81 mg via ORAL
  Filled 2020-07-14 (×2): qty 1

## 2020-07-14 MED ORDER — SODIUM CHLORIDE 0.9% FLUSH: 3.0000 mL | INTRAVENOUS | Status: DC | PRN

## 2020-07-14 MED ORDER — LIDOCAINE HCL (PF) 1 % IJ SOLN
INTRAMUSCULAR | Status: AC
  Filled 2020-07-14: qty 30

## 2020-07-14 MED ORDER — HEPARIN SODIUM (PORCINE) 1000 UNIT/ML IJ SOLN
INTRAMUSCULAR | Status: DC | PRN
  Administered 2020-07-14: 4500 [IU] via INTRAVENOUS

## 2020-07-14 MED ORDER — HYDRALAZINE HCL 20 MG/ML IJ SOLN: 10.0000 mg | INTRAMUSCULAR | Status: AC | PRN

## 2020-07-14 MED ORDER — HEPARIN (PORCINE) IN NACL 1000-0.9 UT/500ML-% IV SOLN
INTRAVENOUS | Status: AC
  Filled 2020-07-14: qty 1000

## 2020-07-14 MED ORDER — LIDOCAINE HCL (PF) 1 % IJ SOLN
INTRAMUSCULAR | Status: DC | PRN
  Administered 2020-07-14: 5 mL

## 2020-07-14 MED ORDER — VERAPAMIL HCL 2.5 MG/ML IV SOLN
INTRAVENOUS | Status: DC | PRN
  Administered 2020-07-14: 10 mL via INTRA_ARTERIAL

## 2020-07-14 MED ORDER — SODIUM CHLORIDE 0.9 % IV SOLN: INTRAVENOUS | Status: AC

## 2020-07-14 MED ORDER — HEPARIN (PORCINE) IN NACL 1000-0.9 UT/500ML-% IV SOLN
INTRAVENOUS | Status: DC | PRN
  Administered 2020-07-14 (×2): 500 mL

## 2020-07-14 MED ORDER — ASPIRIN 81 MG PO CHEW
81.0000 mg | CHEWABLE_TABLET | ORAL | Status: AC
  Administered 2020-07-14: 81 mg via ORAL
  Filled 2020-07-14: qty 1

## 2020-07-14 SURGICAL SUPPLY — 9 items
CATH 5FR JL3.5 JR4 ANG PIG MP (CATHETERS) ×2 IMPLANT
DEVICE RAD COMP TR BAND LRG (VASCULAR PRODUCTS) ×2 IMPLANT
GLIDESHEATH SLEND SS 6F .021 (SHEATH) ×2 IMPLANT
GUIDEWIRE INQWIRE 1.5J.035X260 (WIRE) ×1 IMPLANT
INQWIRE 1.5J .035X260CM (WIRE) ×2
KIT HEART LEFT (KITS) ×2 IMPLANT
PACK CARDIAC CATHETERIZATION (CUSTOM PROCEDURE TRAY) ×2 IMPLANT
TRANSDUCER W/STOPCOCK (MISCELLANEOUS) ×2 IMPLANT
TUBING CIL FLEX 10 FLL-RA (TUBING) ×2 IMPLANT

## 2020-07-14 NOTE — Progress Notes (Signed)
Pt. C/o chest pain. EKG, vitals obtained and PRN given. Pt. Stable, Charge RN aware. Amion server is down. Will continue to monitor.

## 2020-07-14 NOTE — Telephone Encounter (Signed)
New message  Pt has TOC app on 9/27 at 2pm with Dr Harriet Masson

## 2020-07-14 NOTE — Interval H&P Note (Signed)
History and Physical Interval Note:  07/14/2020 9:02 AM  Ian Harrison  has presented today for surgery, with the diagnosis of nonstemi.  The various methods of treatment have been discussed with the patient and family. After consideration of risks, benefits and other options for treatment, the patient has consented to  Procedure(s): LEFT HEART CATH AND CORONARY ANGIOGRAPHY (N/A) as a surgical intervention.  The patient's history has been reviewed, patient examined, no change in status, stable for surgery.  I have reviewed the patient's chart and labs.  Questions were answered to the patient's satisfaction.    Cath Lab Visit (complete for each Cath Lab visit)  Clinical Evaluation Leading to the Procedure:   ACS: Yes.    Non-ACS:    Anginal Classification: No Symptoms  Anti-ischemic medical therapy: Minimal Therapy (1 class of medications)  Non-Invasive Test Results: No non-invasive testing performed  Prior CABG: No previous CABG        Lauree Chandler

## 2020-07-14 NOTE — Progress Notes (Addendum)
PROGRESS NOTE    Ian Harrison  DXI:[id number]  DOB: 1927-04-07  PCP: Mackie Pai, PA-C Admit date:07/09/2020 Chief compliant: Weakness 86 y.o.malewithhistory of mild dementia, CAD status post stenting,  Chronic diastolic CHF, aortic aneurysm s/p repair in 2007 who was recently followed up by urologist for hematuria and urinary retention underwent cystoscopy on June 23, 2020 following which patient's catheter was removed. Patient had followed up with urologist the day prior to admission and had been empirically started on antibiotic for possible UTI. Patient apparently was also taught how to self cath but had not done it because patient was able to urinate without any help. At home patient experienced rigors and felt weak following which patient found it difficult to walk prompting ED visit. ED Course: Afebrile.Patient admitted to [facility] for did not have any focal weakness. UA is consistent with UTI. Labs show significant leukocytosis of 22.2 potassium of 6.3 creatinine was normal. Covid test was negative. While in the ER patient started having chest pressure off and on. EKG shows slightly T wave and ST depression. Chest x-ray was unremarkable Covid test is negative troponins are negative. Patient started on empiric antibiotics and admitted for UTI generalized weakness and chest pain. Patient also was given D50 insulin and sodium bicarb, Lokelma for hyperkalemia. Hospital course: Patient admitted with empiric antibiotics for UTI and severe sepsis.  Seen by cardiology given complaints of chest pain.  Patient was transported to [place] and underwent cardiac cath on 9/20.  Subjective:  Patient resting comfortably.  He underwent cardiac cath at [facility] campus this morning and returned this afternoon.  Daughter and wife at bedside.  Family concerned that patient unable to manage Foley catheter at home.  Requesting urology to see patient.  Objective: Vitals:   07/14/20 1233  07/14/20 1331 07/14/20 1437 07/14/20 1623  BP: 112/65 126/65 130/72 129/67  Pulse: 65 64 74 70  Resp: 18 18 18 18   Temp:  98 F (36.7 C) 97.9 F (36.6 C) 98.2 F (36.8 C)  TempSrc:  Oral Oral Oral  SpO2: 93% 97% 98% 95%  Weight:      Height:        Intake/Output Summary (Last 24 hours) at 07/14/2020 1809 Last data filed at 07/14/2020 1721 Gross per 24 hour  Intake 2383.6 ml  Output 800 ml  Net 1583.6 ml   Filed Weights   07/10/20 0749 07/12/20 0601 07/13/20 0835  Weight: 83 kg 84.6 kg 87.5 kg    Physical Examination: General: Moderately built, no acute distress noted Head ENT: Hard of hearing.  Atraumatic normocephalic, PERRLA, neck supple Heart: S1-S2 heard, regular rate and rhythm, no murmurs.  No leg edema noted Lungs: Equal air entry bilaterally, no rhonchi or rales on exam, no accessory muscle use Abdomen: Bowel sounds heard, soft, nontender, nondistended. No organomegaly.  No CVA tenderness Extremities: No pedal edema.  No cyanosis or clubbing. Neurological: Awake alert oriented x3, no focal weakness or numbness, strength and sensations to crude touch intact Skin: No wounds or rashes.     Data Reviewed: I have personally reviewed following labs and imaging studies  CBC: Recent Labs  Lab 07/09/20 2306 07/10/20 0012 07/10/20 1008 07/11/20 0420 07/12/20 0551 07/13/20 0518 07/14/20 0435  WBC 22.2*   < > 23.3* 17.5* 9.6 7.8 7.5  NEUTROABS 18.7*  --  20.3*  --  7.3 4.8 4.5  HGB 11.4*   < > 10.5* 10.0* 9.9* 10.0* 9.4*  HCT 35.8*   < > 31.4* 30.7* 30.7*  31.1* 29.1*  MCV 103.5*   < > 100.3* 102.3* 103.7* 103.7* 102.8*  PLT 188   < > 162 145* 144* 169 166   < > = values in this interval not displayed.   Basic Metabolic Panel: Recent Labs  Lab 07/10/20 1008 07/11/20 0420 07/12/20 0551 07/13/20 0518 07/14/20 0435  NA 135 137 139 137 137  K 3.8 4.0 4.0 3.6 3.7  CL 103 104 108 104 107  CO2 23 24 22 22 22   GLUCOSE 156* 132* 131* 130* 113*  BUN 22 20 21 16  12   CREATININE 0.96 1.00 0.94 0.71 0.69  CALCIUM 8.5* 8.3* 8.0* 8.0* 7.6*  MG  --   --   --  2.2  --    GFR: Estimated Creatinine Clearance: 64.1 mL/min (by C-G formula based on SCr of 0.69 mg/dL). Liver Function Tests: Recent Labs  Lab 07/09/20 2306 07/10/20 1008  AST 28 16  ALT 13 14  ALKPHOS 45 39  BILITOT 2.0* 1.4*  PROT 7.7 6.5  ALBUMIN 4.0 3.4*   No results for input(s): LIPASE, AMYLASE in the last 168 hours. No results for input(s): AMMONIA in the last 168 hours. Coagulation Profile: No results for input(s): INR, PROTIME in the last 168 hours. Cardiac Enzymes: No results for input(s): CKTOTAL, CKMB, CKMBINDEX, TROPONINI in the last 168 hours. BNP (last 3 results) No results for input(s): PROBNP in the last 8760 hours. HbA1C: No results for input(s): HGBA1C in the last 72 hours. CBG: Recent Labs  Lab 07/10/20 0258  GLUCAP 178*   Lipid Profile: No results for input(s): CHOL, HDL, LDLCALC, TRIG, CHOLHDL, LDLDIRECT in the last 72 hours. Thyroid Function Tests: No results for input(s): TSH, T4TOTAL, FREET4, T3FREE, THYROIDAB in the last 72 hours. Anemia Panel: No results for input(s): VITAMINB12, FOLATE, FERRITIN, TIBC, IRON, RETICCTPCT in the last 72 hours. Sepsis Labs: Recent Labs  Lab 07/10/20 1008 07/10/20 1544 07/11/20 0430  PROCALCITON 0.15  --   --   LATICACIDVEN  --  2.2* 1.4    Recent Results (from the past 240 hour(s))  SARS Coronavirus 2 by RT PCR (hospital order, performed in [facility] hospital lab) Nasopharyngeal Nasopharyngeal Swab     Status: None   Collection Time: 07/09/20 11:07 PM   Specimen: Nasopharyngeal Swab  Result Value Ref Range Status   SARS Coronavirus 2 NEGATIVE NEGATIVE Final    Comment: (NOTE) SARS-CoV-2 target nucleic acids are NOT DETECTED.  The SARS-CoV-2 RNA is generally detectable in upper and lower respiratory specimens during the acute phase of infection. The lowest concentration of SARS-CoV-2 viral copies this  assay can detect is 250 copies / mL. A negative result does not preclude SARS-CoV-2 infection and should not be used as the sole basis for treatment or other patient management decisions.  A negative result may occur with improper specimen collection / handling, submission of specimen other than nasopharyngeal swab, presence of viral mutation(s) within the areas targeted by this assay, and inadequate number of viral copies (<250 copies / mL). A negative result must be combined with clinical observations, patient history, and epidemiological information.  Fact Sheet for Patients:   StrictlyIdeas.no  Fact Sheet for Healthcare Providers: BankingDealers.co.za  This test is not yet approved or  cleared by the [place] FDA and has been authorized for detection and/or diagnosis of SARS-CoV-2 by FDA under an Emergency Use Authorization (EUA).  This EUA will remain in effect (meaning this test can be used) for the duration of the COVID-19  declaration under Section 564(b)(1) of the Act, 21 U.S.C. section 360bbb-3(b)(1), unless the authorization is terminated or revoked sooner.  Performed at [facility], [place] [address] [place], [place] [address]   Urine culture     Status: Abnormal   Collection Time: 07/10/20  1:02 AM   Specimen: Urine, Clean Catch  Result Value Ref Range Status   Specimen Description   Final    URINE, CLEAN CATCH Performed at [facility], [place] [address] [place], [place] [address]    Special Requests   Final    Normal Performed at [facility], [place] [address], [place], [place] [address]    Culture MULTIPLE SPECIES PRESENT, SUGGEST RECOLLECTION (A)  Final   Report Status 07/10/2020 FINAL  Final  Blood culture (routine x 2)     Status: None (Preliminary result)   Collection Time: 07/10/20  2:35 AM   Specimen: BLOOD  Result Value Ref Range Status   Specimen  Description   Final    BLOOD RIGHT ANTECUBITAL Performed at [place] [address], [place], [place] [address]    Special Requests   Final    BOTTLES DRAWN AEROBIC AND ANAEROBIC Blood Culture adequate volume Performed at [place] [address], [place], [place] [address]    Culture   Final    NO GROWTH 4 DAYS Performed at [facility], [place] [address] [place],  [address]    Report Status PENDING  Incomplete      Radiology Studies: CARDIAC CATHETERIZATION  Result Date: 07/14/2020  Prox RCA lesion is 40% stenosed.  Mid RCA to Dist RCA lesion is 10% stenosed.  Prox RCA to Mid RCA lesion is 50% stenosed.  Prox Cx to Mid Cx lesion is 40% stenosed.  Prox LAD to Mid LAD lesion is 20% stenosed.  1. Mild non-obstructive plaque in the mid LAD 2. Mild non-obstructive plaque in the mid Circumflex 3. The RCA is a large dominant vessel with moderate proximal and mid stenosis that does not appear to be flow limiting. Patent distal stented segment with minimal restenosis. Recommendations: Medical management of CAD.      Scheduled Meds: . [START ON 07/15/2020] aspirin EC  81 mg Oral Q breakfast  . Chlorhexidine Gluconate Cloth  6 each Topical Daily  . clopidogrel  75 mg Oral Q breakfast  . docusate sodium  100 mg Oral Daily  . [START ON 07/15/2020] enoxaparin (LOVENOX) injection  40 mg Subcutaneous Q24H  . isosorbide mononitrate  15 mg Oral Daily  . metoprolol succinate  12.5 mg Oral Daily  . sodium chloride flush  3 mL Intravenous Q12H  . sodium chloride flush  3 mL Intravenous Q12H  . tamsulosin  0.4 mg Oral QHS   Continuous Infusions: . sodium chloride    . cefTRIAXone (ROCEPHIN)  IV Stopped (07/14/20 0358)     Assessment/Plan:  1.  Acute cystitis with severe sepsis POA: In the setting of recent indwelling Foley catheter/leg bag and instrumentation (cystoscopy).  Patient presented with report of fevers at home, WBC 20 3K,  lactate 2.2 .  Remains on IV Rocephin, will transition to oral antibiotic as now leukocytosis resolved and no longer NPO.  Per family's request, will notify primary urologist Dr. Claudia Desanctis.   2.  Non-STEMI: Patient had complaints of chest pain relieved by sublingual nitroglycerin and also has history of CAD s/p RCA PCI x2.  Troponins were elevated at 297->2938.  Seen by  cardiology and underwent left heart cath today with nonobstructive CAD.  Continued medical management recommended (continue aspirin, Plavix, Toprol-XL and Imdur).  Of note, patient is also status post repair for aortic aneurysm in 2007  3.  Hyperkalemia: Present on admission, now resolved and potassium level normalized  4.  CKD with anemia of chronic disease: Hemoglobin appears to be at baseline.  According to patient's chart, has history of CKD?  Stage III.  Currently creatinine at 0.9 and GFR greater than 60.  At the most patient has CKD stage I at this point.  5.  Hypertension, chronic stage I diastolic CHF: Continue Toprol and Imdur.  Appears euvolemic.  6.  Prediabetes: Diet controlled at baseline.  Not on any medications.  Hemoglobin A1c at 6.  7.  Bladder mass,?  Urinary retention: Was on Foley catheter/leg bag as outpatient recently and was following urology for gross hematuria and work-up revealing 7 x 4 cm hyperdense structure at bladder base for which he underwent cystoscopy..  Patient currently with indwelling Foley catheter (placed in ED for recurrent retention), will give voiding trial in a.m.  If fails again, will discuss with urology.  8.  Dementia with minimal cognitive impairment: According to family he only has memory issues.  Resume home regimen, currently appears to be hard of hearing but oriented x3.  DVT prophylaxis: Lovenox Code Status: Full code Family / Patient Communication: Discussed with patient, wife and daughter at bedside Disposition Plan:   Status is: Inpatient  Remains inpatient appropriate  because:Inpatient level of care appropriate due to severity of illness   Dispo: The patient is from: Home              Anticipated d/c is to: Home              Anticipated d/c date is: 1 day if passes voiding trial in a.m.              Patient currently is not medically stable to d/c.            Time spent: 25 minutes     >50% time spent in discussions with care team and coordination of care.    Guilford Shi, MD [facility] Pager in [place]  If 7PM-7AM, please contact night-coverage [email or web address] 07/14/2020, 6:09 PM

## 2020-07-14 NOTE — TOC Initial Note (Signed)
Transition of Care [facility]) - Initial/Assessment Note    Patient Details  Name: Ian Harrison MRN: [id number] Date of Birth: 06-22-1927  Transition of Care [facility]) CM/SW Contact:    Dessa Phi, RN Phone Number: 07/14/2020, 2:46 PM  Clinical Narrative: Spoke to spouse Diana-d/c plans home w/HHC-will check on agency able to accept. [employer] for rw ordered-will be delivered to rm prior d/c.                  Expected Discharge Plan: [place] Barriers to Discharge: Continued Medical Work up   Patient Goals and CMS Choice Patient states their goals for this hospitalization and ongoing recovery are:: go home CMS Medicare.gov Compare Post Acute Care list provided to:: Patient Represenative (must comment) (spouse [name]) Choice offered to / list presented to : Spouse  Expected Discharge Plan and Services Expected Discharge Plan: [place]   Discharge Planning Services: CM Consult Post Acute Care Choice: Home Health, Durable Medical Equipment Living arrangements for the past 2 months: Single Family Home Expected Discharge Date:  (unknown)               DME Arranged: Walker rolling DME Agency: [employer] Date DME Agency Contacted: 07/14/20 Time DME Agency Contacted: [phone] Representative spoke with at DME Agency: Jodell Cipro            Prior Living Arrangements/Services Living arrangements for the past 2 months: [place] with:: Spouse Patient language and need for interpreter reviewed:: Yes Do you feel safe going back to the place where you live?: Yes      Need for Family Participation in Patient Care: No (Comment) Care giver support system in place?: Yes (comment)   Criminal Activity/Legal Involvement Pertinent to Current Situation/Hospitalization: No - Comment as needed  Activities of Daily Living Home Assistive Devices/Equipment: Blood pressure cuff, Built-in shower seat, Grab bars in shower, CBG Meter ADL Screening  (condition at time of admission) Patient's cognitive ability adequate to safely complete daily activities?: Yes Is the patient deaf or have difficulty hearing?: Yes (slight hoh) Does the patient have difficulty seeing, even when wearing glasses/contacts?: No Does the patient have difficulty concentrating, remembering, or making decisions?: Yes (short term memory issues) Patient able to express need for assistance with ADLs?: Yes Does the patient have difficulty dressing or bathing?: Yes Independently performs ADLs?: No Communication: Independent Dressing (OT): Needs assistance Is this a change from baseline?: Change from baseline, expected to last >3 days Grooming: Needs assistance Is this a change from baseline?: Change from baseline, expected to last >3 days Feeding: Independent Bathing: Needs assistance Is this a change from baseline?: Change from baseline, expected to last >3 days Toileting: Needs assistance Is this a change from baseline?: Change from baseline, expected to last >3days In/Out Bed: Needs assistance Is this a change from baseline?: Change from baseline, expected to last >3 days Walks in Home: Needs assistance Is this a change from baseline?: Change from baseline, expected to last >3 days Does the patient have difficulty walking or climbing stairs?: Yes (secondary to weakness) Weakness of Legs: Both Weakness of Arms/Hands: None  Permission Sought/Granted Permission sought to share information with : Case Manager Permission granted to share information with : Yes, Verbal Permission Granted  Share Information with NAME: Case Manager           Emotional Assessment Appearance:: Appears stated age Attitude/Demeanor/Rapport: Gracious Affect (typically observed): Accepting Orientation: : Oriented to Self, Oriented to Place Alcohol / Substance  Use: Not Applicable Psych Involvement: No (comment)  Admission diagnosis:  Hyperkalemia [E87.5] Weakness [R53.1] Acute  cystitis without hematuria [N30.00] Generalized weakness [R53.1] Chest pain [R07.9] Patient Active Problem List   Diagnosis Date Noted  . Elevated troponin   . Generalized weakness 07/10/2020  . Acute lower UTI 07/10/2020  . Chest pain 07/10/2020  . Hyperkalemia 07/10/2020  . Chronic diastolic CHF (congestive heart failure) ([place]) 07/10/2020  . Diabetes mellitus type II, non insulin dependent ([place])   . Chronic kidney disease   . Dementia without behavioral disturbance ([place]) 09/24/2019  . Coronary artery disease involving native coronary artery of native heart without angina pectoris 09/03/2019  . Dyslipidemia 09/03/2019  . Essential hypertension 09/03/2019  . AAA (abdominal aortic aneurysm) without rupture ([place]) 09/03/2019   PCP:  Mackie Pai, PA-C Pharmacy:   [place], [place] [address] Phone: [phone] Fax: [phone]     Social Determinants of Health (SDOH) Interventions    Readmission Risk Interventions No flowsheet data found.

## 2020-07-14 NOTE — Plan of Care (Signed)
  Problem: Education: Goal: Knowledge of General Education information will improve Description: Including pain rating scale, medication(s)/side effects and non-pharmacologic comfort measures Outcome: Progressing   Problem: Activity: Goal: Risk for activity intolerance will decrease Outcome: Progressing   

## 2020-07-15 DIAGNOSIS — I214 Non-ST elevation (NSTEMI) myocardial infarction: Secondary | ICD-10-CM

## 2020-07-15 LAB — CULTURE, BLOOD (ROUTINE X 2)
Culture: NO GROWTH
Special Requests: ADEQUATE

## 2020-07-15 MED ORDER — CEFDINIR 300 MG PO CAPS
300.0000 mg | ORAL_CAPSULE | Freq: Two times a day (BID) | ORAL | Status: DC
  Filled 2020-07-15: qty 1

## 2020-07-15 MED ORDER — CIPROFLOXACIN HCL 500 MG PO TABS
500.0000 mg | ORAL_TABLET | Freq: Two times a day (BID) | ORAL | Status: DC
  Administered 2020-07-15 – 2020-07-16 (×2): 500 mg via ORAL
  Filled 2020-07-15 (×2): qty 1

## 2020-07-15 MED FILL — CIPROFLOXACIN HCL 500 MG TA: 500 | 15 days supply | Qty: 30 | Fill #0

## 2020-07-15 NOTE — TOC Progression Note (Signed)
Transition of Care [facility]) - Progression Note    Patient Details  Name: Dajour Pierpoint MRN: [id number] Date of Birth: October 09, 1927  Transition of Care [facility]) CM/SW Contact  Merrick Maggio, Juliann Pulse, RN Phone Number: 07/15/2020, 12:43 PM  Clinical Narrative: d/c home w/[facility] rep aware. [employer] has already delivered rw to rm prior to d/c.       Expected Discharge Plan: [place] Barriers to Discharge: Continued Medical Work up  Expected Discharge Plan and Services Expected Discharge Plan: [place]   Discharge Planning Services: CM Consult Post Acute Care Choice: Home Health, Durable Medical Equipment Living arrangements for the past 2 months: Single Family Home Expected Discharge Date:  (unknown)               DME Arranged: Walker rolling DME Agency: [employer] Date DME Agency Contacted: 07/14/20 Time DME Agency Contacted: [phone] Representative spoke with at DME Agency: [place] ([place]) Interventions    Readmission Risk Interventions No flowsheet data found.

## 2020-07-15 NOTE — Care Management Important Message (Signed)
Important Message  Patient Details IM Letter given to the Patient Name: Ian Harrison MRN: [id number] Date of Birth: September 17, 1927   Medicare Important Message Given:  Yes     Kerin Salen 07/15/2020, 10:41 AM

## 2020-07-15 NOTE — Consult Note (Signed)
I have been asked to see the patient by Dr. Guilford Shi, for evaluation and management of urinary retention.  History of present illness: 86 year old male with no significant past urological history presented to the ER on 06/09/2020 with gross hematuria. He currently was taking Plavix as well as aspirin due to a history of myocardial infarction. He continued to have gross hematuria and was followed up in our office on 06/12/2020. At this time urinalysis did not show any signs of infection. A CT scan did show a 7 x 4 cm hyperdense structure at the bladder base. He underwent cystoscopy, bladder biopsy and clot evacuation on 06/23/2020. He tolerated this well and pathology report showed polypoid cystitis. He presented on 07/01/2020 for a trial of void which he did not successfully pass. He was then started on Flomax.  He has restarted his Plavix.  He was also taught CIC.    He did not perform CIC himself but appeared to be emptying his bladder reasonably well.   Urine culture was obtained at his most recent office visit but unfortunately did not result until 3 days later, at which point he had already been admitted to the emergency department for UTI.  Urine culture did demonstrate a pansensitive Pseudomonas aeruginosa.  The patient's hospital course was noted, complicated by chest pain and subsequent cardiac cath.  His Foley catheter was removed this morning, he was having some elevated residuals early after the catheter was removed but was able to void down most of his PVR and his most recent one was 250 mL.  Review of systems: A 12 point comprehensive review of systems was obtained and is negative unless otherwise stated in the history of present illness.  Patient Active Problem List   Diagnosis Date Noted  . Elevated troponin   . Generalized weakness 07/10/2020  . Acute lower UTI 07/10/2020  . Chest pain 07/10/2020  . Hyperkalemia 07/10/2020  . Chronic diastolic CHF (congestive heart failure)  ([place]) 07/10/2020  . Diabetes mellitus type II, non insulin dependent ([place])   . Chronic kidney disease   . Dementia without behavioral disturbance ([place]) 09/24/2019  . Coronary artery disease involving native coronary artery of native heart without angina pectoris 09/03/2019  . Dyslipidemia 09/03/2019  . Essential hypertension 09/03/2019  . AAA (abdominal aortic aneurysm) without rupture ([place]) 09/03/2019    No current facility-administered medications on file prior to encounter.   Current Outpatient Medications on File Prior to Encounter  Medication Sig Dispense Refill  . aspirin EC 81 MG tablet Take 81 mg by mouth daily with breakfast.     . cephALEXin (KEFLEX) 500 MG capsule Take 500 mg by mouth 2 (two) times daily.    . clopidogrel (PLAVIX) 75 MG tablet Take 1 tablet (75 mg total) by mouth daily. (Patient taking differently: Take 75 mg by mouth daily with breakfast. ) 90 tablet 1  . metoprolol succinate (TOPROL-XL) 25 MG 24 hr tablet Take 1 tablet (25 mg total) by mouth daily. (Patient taking differently: Take 25 mg by mouth daily with breakfast. ) 90 tablet 1  . tamsulosin (FLOMAX) 0.4 MG CAPS capsule Take 0.4 mg by mouth at bedtime.      Past Medical History:  Diagnosis Date  . Aortic aneurysm ([place])   . CAD (coronary artery disease)   . Cancer [facility])    Skin cancer  . Chronic kidney disease   . Dementia ([place])   . Diabetes mellitus type II, non insulin dependent ([place])   . Dyslipidemia  Intolerance to atorvastatin due to myalgias  . Myocardial infarction ([place])    15 years ago  . Vasovagal syncope    Without recurrence    Past Surgical History:  Procedure Laterality Date  . ABDOMINAL AORTIC ANEURYSM REPAIR    . APPENDECTOMY    . CATARACT EXTRACTION W/ INTRAOCULAR LENS IMPLANT    . CYSTOSCOPY WITH FULGERATION N/A 06/23/2020   Procedure: CYSTOSCOPY WITH CLOT EVACUATION;  Surgeon: Robley Fries, MD;  Location: [facility];  Service: Urology;  Laterality: N/A;  . HERNIA REPAIR     . LEFT HEART CATH AND CORONARY ANGIOGRAPHY N/A 07/14/2020   Procedure: LEFT HEART CATH AND CORONARY ANGIOGRAPHY;  Surgeon: Burnell Blanks, MD;  Location: [place] [facility];  Service: Cardiovascular;  Laterality: N/A;  . TRANSURETHRAL RESECTION OF BLADDER TUMOR N/A 06/23/2020   Procedure: TRANSURETHRAL RESECTION OF BLADDER TUMOR (TURBT);  Surgeon: Robley Fries, MD;  Location: [facility];  Service: Urology;  Laterality: N/A;  90 MINS  . WISDOM TOOTH EXTRACTION      Social History   Tobacco Use  . Smoking status: Former Research scientist (life sciences)  . Smokeless tobacco: Never Used  . Tobacco comment: Quit 17 years ago  Vaping Use  . Vaping Use: Never used  Substance Use Topics  . Alcohol use: Not Currently  . Drug use: Never    Family History  Problem Relation Age of Onset  . Heart attack Brother     PE: Vitals:   07/14/20 1819 07/14/20 2051 07/15/20 0442 07/15/20 1312  BP: 125/63 118/64 127/65 (!) 108/55  Pulse: 80 71 72 71  Resp: 18 18 18 18   Temp: 98.4 F (36.9 C) 98 F (36.7 C) 98 F (36.7 C) 98 F (36.7 C)  TempSrc: Oral Oral Oral Oral  SpO2: 95% 96% 91% 96%  Weight:      Height:       Patient appears to be in no acute distress  patient is alert and oriented x3 Atraumatic normocephalic head No cervical or supraclavicular lymphadenopathy appreciated No increased work of breathing, no audible wheezes/rhonchi Regular sinus rhythm/rate Abdomen is soft, nontender, nondistended, no CVA or suprapubic tenderness Lower extremities are symmetric without appreciable edema Grossly neurologically intact No identifiable skin lesions  Recent Labs    07/13/20 0518 07/14/20 0435  WBC 7.8 7.5  HGB 10.0* 9.4*  HCT 31.1* 29.1*   Recent Labs    07/13/20 0518 07/14/20 0435  NA 137 137  K 3.6 3.7  CL 104 107  CO2 22 22  GLUCOSE 130* 113*  BUN 16 12  CREATININE 0.71 0.69  CALCIUM 8.0* 7.6*   No results for input(s): LABPT, INR in the last 72 hours. No results for input(s):  LABURIN in the last 72 hours. Results for orders placed or performed during the hospital encounter of 07/09/20  SARS Coronavirus 2 by RT PCR (hospital order, performed in [facility] hospital lab) Nasopharyngeal Nasopharyngeal Swab     Status: None   Collection Time: 07/09/20 11:07 PM   Specimen: Nasopharyngeal Swab  Result Value Ref Range Status   SARS Coronavirus 2 NEGATIVE NEGATIVE Final    Comment: (NOTE) SARS-CoV-2 target nucleic acids are NOT DETECTED.  The SARS-CoV-2 RNA is generally detectable in upper and lower respiratory specimens during the acute phase of infection. The lowest concentration of SARS-CoV-2 viral copies this assay can detect is 250 copies / mL. A negative result does not preclude SARS-CoV-2 infection and should not be used as the sole basis for  treatment or other patient management decisions.  A negative result may occur with improper specimen collection / handling, submission of specimen other than nasopharyngeal swab, presence of viral mutation(s) within the areas targeted by this assay, and inadequate number of viral copies (<250 copies / mL). A negative result must be combined with clinical observations, patient history, and epidemiological information.  Fact Sheet for Patients:   StrictlyIdeas.no  Fact Sheet for Healthcare Providers: BankingDealers.co.za  This test is not yet approved or  cleared by the [place] FDA and has been authorized for detection and/or diagnosis of SARS-CoV-2 by FDA under an Emergency Use Authorization (EUA).  This EUA will remain in effect (meaning this test can be used) for the duration of the COVID-19 declaration under Section 564(b)(1) of the Act, 21 U.S.C. section 360bbb-3(b)(1), unless the authorization is terminated or revoked sooner.  Performed at [facility], [place] [address], [place], [place] [address]   Urine culture     Status: Abnormal    Collection Time: 07/10/20  1:02 AM   Specimen: Urine, Clean Catch  Result Value Ref Range Status   Specimen Description   Final    URINE, CLEAN CATCH Performed at [facility], [place] [address] [place], [place] [address]    Special Requests   Final    Normal Performed at [facility], [place] [address] [place], [place] [address]    Culture MULTIPLE SPECIES PRESENT, SUGGEST RECOLLECTION (A)  Final   Report Status 07/10/2020 FINAL  Final  Blood culture (routine x 2)     Status: None   Collection Time: 07/10/20  2:35 AM   Specimen: BLOOD  Result Value Ref Range Status   Specimen Description   Final    BLOOD RIGHT ANTECUBITAL Performed at [place] [address], [place], [place] [address]    Special Requests   Final    BOTTLES DRAWN AEROBIC AND ANAEROBIC Blood Culture adequate volume Performed at [facility] [address] [place], [place] [address]    Culture   Final    NO GROWTH 5 DAYS Performed at [facility], [place] [address], [place],  [address]    Report Status 07/15/2020 FINAL  Final    Imaging: none  Imp: The patient had a Pseudomonas UTI which was inadequately treated initially resulting in confusion, weakness, and frequent urination.  Ultimately, the patient did develop some chest pain, fortunately his stenosis was mild.  Currently the patient does not have a catheter, and he appears to be voiding reasonably well.  Recommendations: Would recommend that the patient be transitioned to ciprofloxacin, he will likely need a 14-day course of antibiotics for his Pseudomonas UTI.  Would plan to scan the patient's bladder every 4 hours to ensure that he is not developing urinary retention overnight.  If the patient is able to maintain a reasonable PVR (less than 250 mL) and is afebrile in the morning it would be reasonable for him to discharge with close urology  follow-up.   Ardis Hughs

## 2020-07-15 NOTE — Progress Notes (Signed)
Physical Therapy Treatment Patient Details Name: Ian Harrison MRN: [id number] DOB: Apr 02, 1927 Today's Date: 07/15/2020    History of Present Illness 86 y.o. male with a hx of prior MI requiring RCA stenting in 2005, NSTEMI 02/2015 with second RCA stent placement with residual 50% LCx disease ([place]), aortic aneurysm s/p repair in 2007, dyslipidemia intolerant to statins, DM2, CKD and dementia who was admitted with chest pain and NSTEMI.    PT Comments    Patient making steady progress with acute PT and progressed gait distance with use of RW. Pt required min guard assist for safety with transfers using RW and min guard for gait with occasional assist for walker positioning. Pt's HR stable throughout and no c/o chest pain, SOB, lightheadedness, or dizziness with mobility. He continues to have mild confusion during session but is pleasant throughout. Pt's wife verbalized understanding of pt using RW for mobility to improve safety. He will benefit from follow up therapy with HHPT services. Acute PT will continue to progress as able.     07/15/20 1100  PT Visit Information  Last PT Received On 07/15/20  Assistance Needed +1  History of Present Illness 86 y.o. male with a hx of prior MI requiring RCA stenting in 2005, NSTEMI 02/2015 with second RCA stent placement with residual 50% LCx disease ([place]), aortic aneurysm s/p repair in 2007, dyslipidemia intolerant to statins, DM2, CKD and dementia who was admitted with chest pain and NSTEMI.  Precautions  Precautions Fall  Restrictions  Weight Bearing Restrictions No  Pain Assessment  Pain Assessment No/denies pain  Cognition  Arousal/Alertness Awake/alert  Behavior During Therapy WFL for tasks assessed/performed  Overall Cognitive Status History of cognitive impairments - at baseline  General Comments Pt with hx of dementia.  He was very pleasant and tended to hide confusion (defered home questions to wife at times. Also making irrelevant  but amusing and pleasent commentary during session.  Bed Mobility  Overal bed mobility Needs Assistance  General bed mobility comments OOB on BSC and returned to [facility] to attempt voiding bladder.  Transfers  Overall transfer level Needs assistance  Equipment used Rolling walker (2 wheeled)  Transfers Sit to/from Stand  Sit to Stand Min guard  General transfer comment Min guard for safety, cues for hand placement on RW  Ambulation/Gait  Ambulation/Gait assistance Min guard;Min assist  Gait Distance (Feet) 200 Feet  Assistive device Rolling walker (2 wheeled)  Gait Pattern/deviations Step-through pattern;Decreased stride length;Shuffle  General Gait Details cues for safe proximity to RW during gait and no overt LOB noted. Min guard throughout with Min assist 2x for walker positioning.   Gait velocity decr  Balance  Overall balance assessment Needs assistance  Sitting-balance support No upper extremity supported  Sitting balance-Leahy Scale Good  Standing balance support Bilateral upper extremity supported;During functional activity  Standing balance-Leahy Scale Fair  PT - End of Session  Equipment Utilized During Treatment Gait belt  Activity Tolerance Patient tolerated treatment well  Patient left Other (comment);with call bell/phone within reach;with family/visitor present (on [facility] with family wife at bedside)   PT - Assessment/Plan  PT Visit Diagnosis Unsteadiness on feet (R26.81);Other abnormalities of gait and mobility (R26.89)  PT Frequency (ACUTE ONLY) Min 3X/week  Follow Up Recommendations Home health PT;Supervision/Assistance - 24 hour  PT equipment Rolling walker with 5" wheels  AM-PAC PT "6 Clicks" Mobility Outcome Measure (Version 2)  Help needed turning from your back to your side while in a flat bed without using bedrails? 3  Help needed moving from lying on your back to sitting on the side of a flat bed without using bedrails? 3  Help needed moving to and from a bed to a  chair (including a wheelchair)? 3  Help needed standing up from a chair using your arms (e.g., wheelchair or bedside chair)? 3  Help needed to walk in hospital room? 3  Help needed climbing 3-5 steps with a railing?  3  6 Click Score 18  Consider Recommendation of Discharge To: Home with [facility]  PT Goal Progression  Progress towards PT goals Progressing toward goals  Acute Rehab PT Goals  PT Goal Formulation With patient/family  Time For Goal Achievement 07/26/20  Potential to Achieve Goals Good  PT Time Calculation  PT Start Time (ACUTE ONLY) 1046  PT Stop Time (ACUTE ONLY) 1114  PT Time Calculation (min) (ACUTE ONLY) 28 min  PT General Charges  $$ ACUTE PT VISIT 1 Visit  PT Treatments  $Gait Training 8-22 mins  $Therapeutic Activity 8-22 mins    Verner Mould, DPT Acute Rehabilitation Services  Office [phone] Pager [phone]  07/15/2020 5:09 PM

## 2020-07-15 NOTE — Progress Notes (Signed)
Progress Note  Patient Name: Ian Harrison Date of Encounter: 07/15/2020  [facility] Cardiologist: Godfrey Pick Tobb, DO   Subjective   No CP or dyspnea  Inpatient Medications    Scheduled Meds: . aspirin EC  81 mg Oral Q breakfast  . cefdinir  300 mg Oral Q12H  . Chlorhexidine Gluconate Cloth  6 each Topical Daily  . clopidogrel  75 mg Oral Q breakfast  . docusate sodium  100 mg Oral Daily  . enoxaparin (LOVENOX) injection  40 mg Subcutaneous Q24H  . isosorbide mononitrate  15 mg Oral Daily  . metoprolol succinate  12.5 mg Oral Daily  . sodium chloride flush  3 mL Intravenous Q12H  . sodium chloride flush  3 mL Intravenous Q12H  . tamsulosin  0.4 mg Oral QHS   Continuous Infusions: . sodium chloride 250 mL (07/15/20 0403)   PRN Meds: sodium chloride, nitroGLYCERIN, ondansetron **OR** ondansetron (ZOFRAN) IV, sodium chloride flush, traMADol   Vital Signs    Vitals:   07/14/20 1623 07/14/20 1819 07/14/20 2051 07/15/20 0442  BP: 129/67 125/63 118/64 127/65  Pulse: 70 80 71 72  Resp: 18 18 18 18   Temp: 98.2 F (36.8 C) 98.4 F (36.9 C) 98 F (36.7 C) 98 F (36.7 C)  TempSrc: Oral Oral Oral Oral  SpO2: 95% 95% 96% 91%  Weight:      Height:        Intake/Output Summary (Last 24 hours) at 07/15/2020 0944 Last data filed at 07/15/2020 0600 Gross per 24 hour  Intake 639.17 ml  Output 925 ml  Net -285.83 ml   Last 3 Weights 07/13/2020 07/12/2020 07/10/2020  Weight (lbs) 192 lb 14.4 oz 186 lb 6.4 oz 183 lb  Weight (kg) 87.5 kg 84.55 kg 83.008 kg      Telemetry    Sinus - Personally Reviewed  Physical Exam   GEN: No acute distress.   Neck: No JVD Cardiac: RRR, no murmurs, rubs, or gallops.  Respiratory: Clear to auscultation bilaterally. GI: Soft, nontender, non-distended  MS: No edema; Radial cath site with no hematoma Neuro:  Nonfocal  Psych: Normal affect   Labs    High Sensitivity Troponin:   Recent Labs  Lab 07/09/20 2306 07/10/20 0230  07/10/20 1008 07/10/20 1544  TROPONINIHS 12 12 297* 2,938*      Chemistry Recent Labs  Lab 07/09/20 2306 07/10/20 0012 07/10/20 1008 07/11/20 0420 07/12/20 0551 07/13/20 0518 07/14/20 0435  NA 133*   < > 135   < > 139 137 137  K 6.3*   < > 3.8   < > 4.0 3.6 3.7  CL 100   < > 103   < > 108 104 107  CO2 23   < > 23   < > 22 22 22   GLUCOSE 135*   < > 156*   < > 131* 130* 113*  BUN 22   < > 22   < > 21 16 12   CREATININE 1.03   < > 0.96   < > 0.94 0.71 0.69  CALCIUM 8.9   < > 8.5*   < > 8.0* 8.0* 7.6*  PROT 7.7  --  6.5  --   --   --   --   ALBUMIN 4.0  --  3.4*  --   --   --   --   AST 28  --  16  --   --   --   --   ALT 13  --  14  --   --   --   --   ALKPHOS 45  --  39  --   --   --   --   BILITOT 2.0*  --  1.4*  --   --   --   --   GFRNONAA >60   < > >60   < > >60 >60 >60  GFRAA >60   < > >60   < > >60 >60 >60  ANIONGAP 10   < > 9   < > 9 11 8    < > = values in this interval not displayed.     Hematology Recent Labs  Lab 07/12/20 0551 07/13/20 0518 07/14/20 0435  WBC 9.6 7.8 7.5  RBC 2.96* 3.00* 2.83*  HGB 9.9* 10.0* 9.4*  HCT 30.7* 31.1* 29.1*  MCV 103.7* 103.7* 102.8*  MCH 33.4 33.3 33.2  MCHC 32.2 32.2 32.3  RDW 14.4 14.3 14.1  PLT 144* 169 166    BNP Recent Labs  Lab 07/10/20 1544  BNP 616.4*     Radiology    CARDIAC CATHETERIZATION  Result Date: 07/14/2020  Prox RCA lesion is 40% stenosed.  Mid RCA to Dist RCA lesion is 10% stenosed.  Prox RCA to Mid RCA lesion is 50% stenosed.  Prox Cx to Mid Cx lesion is 40% stenosed.  Prox LAD to Mid LAD lesion is 20% stenosed.  1. Mild non-obstructive plaque in the mid LAD 2. Mild non-obstructive plaque in the mid Circumflex 3. The RCA is a large dominant vessel with moderate proximal and mid stenosis that does not appear to be flow limiting. Patent distal stented segment with minimal restenosis. Recommendations: Medical management of CAD.    Patient Profile     86 y.o. male with past medical history  of coronary artery disease, prior repair of aortic aneurysm, hyperlipidemia statin intolerant, diabetes mellitus, chronic kidney disease, dementia admitted with non-ST elevation myocardial infarction.  Cardiac catheterization revealed 40% followed by 50% RCA, 40% circumflex and 20% LAD.  Medical therapy recommended.  Assessment & Plan    1 non-ST elevation myocardial infarction-patient has had no chest pain since cardiac catheterization. This revealed nonobstructive coronary disease and plan is for medical therapy. Continue aspirin, Plavix and beta-blocker. He is intolerant to statins.  2 urinary tract infection-management per urology and primary service.  3 hypertension-patient's blood pressure is controlled. Continue present medications and follow.  4 hyperlipidemia-patient apparently is intolerant to statins. Could consider Zetia or Repatha as an outpatient.  Patient can be discharged from a cardiac standpoint. Would arrange follow-up with Dr. Harriet Masson in 4 to 6 weeks in [facility]. Would continue present medications at discharge. Please call with questions.  For questions or updates, please contact [place] Please consult [email or web address] for contact info under        Signed, Kirk Ruths, MD  07/15/2020, 9:44 AM

## 2020-07-15 NOTE — Plan of Care (Signed)
Urine culture results from Urology Clinic on 9/15.   Culture, Urine - N  Notes: Performed By: [facility], [place], [place], [place], [address]; Lab Dir: Vedia Pereyra, [phone] >100,000 C/ML GRAM NEGATIVE RODS; ID AND SENSITIVITY TO FOLLOW MIXED GROWTH; ATTEMPTING WORKUP OF THE PREDOMINANT ORGANISM. ROrganism 1 - N  Notes: Performed By: [facility]	[place], [employer], [address] [place], [place], [place], [address]; Lab Dir: Vedia Pereyra, [phone] . Antimicrobial Susceptibility and Organism Identification Report  FINAL  Source : URINE CULTURE Iso/Result : 01 >100,000 C/ML Pseudomonas aeruginosa Antimicrobic/Dose MIC SYSTEMIC URINE --------------------------------------------------------------- Amikacin <16 S S Aztreonam <4 S S Ceftazidime 4 S S Ciprofloxacin <1 S S Cefepime <2 S S Gentamicin <4 S S Levofloxacin <2 S S Meropenem <1 S S Pip/Tazo <16 S S Tobramycin <4 S S

## 2020-07-15 NOTE — Progress Notes (Signed)
PROGRESS NOTE    Ian Harrison  BTD:[id number]  DOB: November 27, 1926  PCP: Mackie Pai, PA-C Admit date:07/09/2020 Chief compliant: Weakness 86 y.o.malewithhistory of mild dementia, CAD status post stenting,  Chronic diastolic CHF, aortic aneurysm s/p repair in 2007 who was recently followed up by urologist for hematuria and urinary retention underwent cystoscopy on June 23, 2020 following which patient's catheter was removed. Patient had followed up with urologist the day prior to admission and had been empirically started on antibiotic for possible UTI. Patient apparently was also taught how to self cath but had not done it because patient was able to urinate without any help. At home patient experienced rigors and felt weak following which patient found it difficult to walk prompting ED visit. ED Course: Afebrile.Patient admitted to [facility] for did not have any focal weakness. UA is consistent with UTI. Labs show significant leukocytosis of 22.2 potassium of 6.3 creatinine was normal. Covid test was negative. While in the ER patient started having chest pressure off and on. EKG shows slightly T wave and ST depression. Chest x-ray was unremarkable Covid test is negative troponins are negative. Patient started on empiric antibiotics and admitted for UTI generalized weakness and chest pain. Patient also was given D50 insulin and sodium bicarb, Lokelma for hyperkalemia. Hospital course: Patient admitted with empiric antibiotics for UTI and severe sepsis.  Seen by cardiology given complaints of chest pain.  Patient was transported to [facility] and underwent cardiac cath on 9/20.  Subjective:  Patient seen in rounds this morning.  Foley catheter discontinued for voiding trial.  His out of bed to chair, feels somewhat bloated but looking forward to going home.  Wife at bedside and states she still has the prescription for Keflex from urology office.  Objective: Vitals:   07/14/20  1819 07/14/20 2051 07/15/20 0442 07/15/20 1312  BP: 125/63 118/64 127/65 (!) 108/55  Pulse: 80 71 72 71  Resp: 18 18 18 18   Temp: 98.4 F (36.9 C) 98 F (36.7 C) 98 F (36.7 C) 98 F (36.7 C)  TempSrc: Oral Oral Oral Oral  SpO2: 95% 96% 91% 96%  Weight:      Height:        Intake/Output Summary (Last 24 hours) at 07/15/2020 1849 Last data filed at 07/15/2020 1500 Gross per 24 hour  Intake 593.41 ml  Output 525 ml  Net 68.41 ml   Filed Weights   07/10/20 0749 07/12/20 0601 07/13/20 0835  Weight: 83 kg 84.6 kg 87.5 kg    Physical Examination: General: Moderately built, no acute distress noted Head ENT: Hard of hearing.  Atraumatic normocephalic, PERRLA, neck supple Heart: S1-S2 heard, regular rate and rhythm, no murmurs.  No leg edema noted Lungs: Equal air entry bilaterally, no rhonchi or rales on exam, no accessory muscle use Abdomen: Mildly distended, suprapubic fullness, bowel sounds heard, soft, nontender,No organomegaly.  No CVA tenderness Extremities: No pedal edema.  No cyanosis or clubbing. Neurological: Awake alert oriented x3, no focal weakness or numbness, strength and sensations to crude touch intact Skin: No wounds or rashes.     Data Reviewed: I have personally reviewed following labs and imaging studies  CBC: Recent Labs  Lab 07/09/20 2306 07/10/20 0012 07/10/20 1008 07/11/20 0420 07/12/20 0551 07/13/20 0518 07/14/20 0435  WBC 22.2*   < > 23.3* 17.5* 9.6 7.8 7.5  NEUTROABS 18.7*  --  20.3*  --  7.3 4.8 4.5  HGB 11.4*   < > 10.5* 10.0* 9.9* 10.0* 9.4*  HCT 35.8*   < > 31.4* 30.7* 30.7* 31.1* 29.1*  MCV 103.5*   < > 100.3* 102.3* 103.7* 103.7* 102.8*  PLT 188   < > 162 145* 144* 169 166   < > = values in this interval not displayed.   Basic Metabolic Panel: Recent Labs  Lab 07/10/20 1008 07/11/20 0420 07/12/20 0551 07/13/20 0518 07/14/20 0435  NA 135 137 139 137 137  K 3.8 4.0 4.0 3.6 3.7  CL 103 104 108 104 107  CO2 23 24 22 22 22     GLUCOSE 156* 132* 131* 130* 113*  BUN 22 20 21 16 12   CREATININE 0.96 1.00 0.94 0.71 0.69  CALCIUM 8.5* 8.3* 8.0* 8.0* 7.6*  MG  --   --   --  2.2  --    GFR: Estimated Creatinine Clearance: 64.1 mL/min (by C-G formula based on SCr of 0.69 mg/dL). Liver Function Tests: Recent Labs  Lab 07/09/20 2306 07/10/20 1008  AST 28 16  ALT 13 14  ALKPHOS 45 39  BILITOT 2.0* 1.4*  PROT 7.7 6.5  ALBUMIN 4.0 3.4*   No results for input(s): LIPASE, AMYLASE in the last 168 hours. No results for input(s): AMMONIA in the last 168 hours. Coagulation Profile: No results for input(s): INR, PROTIME in the last 168 hours. Cardiac Enzymes: No results for input(s): CKTOTAL, CKMB, CKMBINDEX, TROPONINI in the last 168 hours. BNP (last 3 results) No results for input(s): PROBNP in the last 8760 hours. HbA1C: No results for input(s): HGBA1C in the last 72 hours. CBG: Recent Labs  Lab 07/10/20 0258  GLUCAP 178*   Lipid Profile: No results for input(s): CHOL, HDL, LDLCALC, TRIG, CHOLHDL, LDLDIRECT in the last 72 hours. Thyroid Function Tests: No results for input(s): TSH, T4TOTAL, FREET4, T3FREE, THYROIDAB in the last 72 hours. Anemia Panel: No results for input(s): VITAMINB12, FOLATE, FERRITIN, TIBC, IRON, RETICCTPCT in the last 72 hours. Sepsis Labs: Recent Labs  Lab 07/10/20 1008 07/10/20 1544 07/11/20 0430  PROCALCITON 0.15  --   --   LATICACIDVEN  --  2.2* 1.4    Recent Results (from the past 240 hour(s))  SARS Coronavirus 2 by RT PCR (hospital order, performed in [facility] hospital lab) Nasopharyngeal Nasopharyngeal Swab     Status: None   Collection Time: 07/09/20 11:07 PM   Specimen: Nasopharyngeal Swab  Result Value Ref Range Status   SARS Coronavirus 2 NEGATIVE NEGATIVE Final    Comment: (NOTE) SARS-CoV-2 target nucleic acids are NOT DETECTED.  The SARS-CoV-2 RNA is generally detectable in upper and lower respiratory specimens during the acute phase of infection. The  lowest concentration of SARS-CoV-2 viral copies this assay can detect is 250 copies / mL. A negative result does not preclude SARS-CoV-2 infection and should not be used as the sole basis for treatment or other patient management decisions.  A negative result may occur with improper specimen collection / handling, submission of specimen other than nasopharyngeal swab, presence of viral mutation(s) within the areas targeted by this assay, and inadequate number of viral copies (<250 copies / mL). A negative result must be combined with clinical observations, patient history, and epidemiological information.  Fact Sheet for Patients:   StrictlyIdeas.no  Fact Sheet for Healthcare Providers: BankingDealers.co.za  This test is not yet approved or  cleared by the [place] FDA and has been authorized for detection and/or diagnosis of SARS-CoV-2 by FDA under an Emergency Use Authorization (EUA).  This EUA will remain in effect (meaning this  test can be used) for the duration of the COVID-19 declaration under Section 564(b)(1) of the Act, 21 U.S.C. section 360bbb-3(b)(1), unless the authorization is terminated or revoked sooner.  Performed at [facility], [place] [address], [place], [place] [address]   Urine culture     Status: Abnormal   Collection Time: 07/10/20  1:02 AM   Specimen: Urine, Clean Catch  Result Value Ref Range Status   Specimen Description   Final    URINE, CLEAN CATCH Performed at [facility], [place] [address], [place], [place] [address]    Special Requests   Final    Normal Performed at [facility], [place] [address] [place], [place] [address]    Culture MULTIPLE SPECIES PRESENT, SUGGEST RECOLLECTION (A)  Final   Report Status 07/10/2020 FINAL  Final  Blood culture (routine x 2)     Status: None   Collection Time: 07/10/20  2:35 AM   Specimen: BLOOD  Result  Value Ref Range Status   Specimen Description   Final    BLOOD RIGHT ANTECUBITAL Performed at [place] [address], [place], [place] [address]    Special Requests   Final    BOTTLES DRAWN AEROBIC AND ANAEROBIC Blood Culture adequate volume Performed at [place] [address] [place], [place] [address]    Culture   Final    NO GROWTH 5 DAYS Performed at [facility], [place] [address], [place], [place] [address]    Report Status 07/15/2020 FINAL  Final      Radiology Studies: CARDIAC CATHETERIZATION  Result Date: 07/14/2020  Prox RCA lesion is 40% stenosed.  Mid RCA to Dist RCA lesion is 10% stenosed.  Prox RCA to Mid RCA lesion is 50% stenosed.  Prox Cx to Mid Cx lesion is 40% stenosed.  Prox LAD to Mid LAD lesion is 20% stenosed.  1. Mild non-obstructive plaque in the mid LAD 2. Mild non-obstructive plaque in the mid Circumflex 3. The RCA is a large dominant vessel with moderate proximal and mid stenosis that does not appear to be flow limiting. Patent distal stented segment with minimal restenosis. Recommendations: Medical management of CAD.      Scheduled Meds: . aspirin EC  81 mg Oral Q breakfast  . cefdinir  300 mg Oral Q12H  . Chlorhexidine Gluconate Cloth  6 each Topical Daily  . clopidogrel  75 mg Oral Q breakfast  . docusate sodium  100 mg Oral Daily  . isosorbide mononitrate  15 mg Oral Daily  . metoprolol succinate  12.5 mg Oral Daily  . sodium chloride flush  3 mL Intravenous Q12H  . sodium chloride flush  3 mL Intravenous Q12H  . tamsulosin  0.4 mg Oral QHS   Continuous Infusions: . sodium chloride Stopped (07/15/20 0086)     Assessment/Plan:  1.Acute cystitis with severe sepsis POA: In the setting of recent indwelling Foley catheter/leg bag and instrumentation (cystoscopy). Patient presented with report of fevers at home, WBC 20 3K, lactate 2.2.Recieved IV Rocephin while here ( Day 5) , will  transition to oral antibiotic as now leukocytosis resolved and no longer NPO. Per family's request, notifed primary urologist Dr. Claudia Desanctis but she is out of town.  Requested on call MD Dr. Louis Meckel to follow-up as patient retaining urine again with PVR  260 mL.  Wife does not want to take patient home without urology evaluation especially if he has to go home  with Foley catheter again.  Per Dr. Louis Meckel, urine cultures from their clinic 9/15 growing Pseudomonas-recommended transitioning to p.o. ciprofloxacin.  He will follow-up in a.m. and recommended to continue monitoring PVRs for another night.  2.Non-STEMI: Patient had complaints of chest pain relieved by sublingual nitroglycerin and also has history of CAD s/p RCA PCI x2.Troponins were elevated at 297->2938.Seen by cardiology and underwent left heart cath 9/20 with nonobstructive CAD. Likely Continued medical management recommended(continue aspirin, Plavix, Toprol-XL and Imdur). Of note, patient is also status post repair for aortic aneurysm in 2007  3.Hyperkalemia: Present on admission, now resolved and potassium level normalized  4.CKD with anemia of chronic disease:Hemoglobin appears to be at baseline. According to patient's chart, has history of CKD? Stage III. Currently creatinine at 0.9 and GFR greater than 60. At the most patient has CKD stage I at this point.  5.Hypertension, chronic stage I diastolic JGG:EZMOQHUT Toprol and Imdur.Appears euvolemic.  6.Prediabetes:Diet controlled at baseline. Not on any medications. Hemoglobin A1c at 6.  7.Bladder mass,? Urinary retention: Was on Foley catheter/leg bag as outpatient recently and was following urology for gross hematuria and work-up revealing 7 x 4 cm hyperdense structure at bladder base for which he underwent cystoscopy..Patient currently with indwelling Foley catheter (placed in ED for recurrent retention), underwent voiding trial this morning.    8.Dementia with minimal cognitive impairment:According to family he only has memory issues. Resume home regimen, currently appears to be hard of hearing but oriented x3.  DVT prophylaxis: Lovenox Code Status: Full code Family / Patient Communication: Discussed with patient and wife at bedside Disposition Plan:   Status is: Inpatient  Remains inpatient appropriate because:Inpatient level of care appropriate due to severity of illness   Dispo: The patient is from: Home              Anticipated d/c is to: Home              Anticipated d/c date is: 1 day if passes voiding trial in a.m.              Patient currently is not medically stable to d/c.    Time spent: 25 minutes     >50% time spent in discussions with care team and coordination of care.    Guilford Shi, MD [facility] Pager in [place]  If 7PM-7AM, please contact night-coverage [email or web address] 07/15/2020, 6:49 PM

## 2020-07-16 ENCOUNTER — Telehealth: Payer: Self-pay | Admitting: Medical

## 2020-07-16 LAB — BASIC METABOLIC PANEL
Anion gap: 7 (ref 5–15)
BUN: 13 mg/dL (ref 8–23)
CO2: 24 mmol/L (ref 22–32)
Calcium: 7.8 mg/dL — ABNORMAL LOW (ref 8.9–10.3)
Chloride: 104 mmol/L (ref 98–111)
Creatinine, Ser: 0.97 mg/dL (ref 0.61–1.24)
GFR calc Af Amer: >60 mL/min (ref >60)
GFR calc non Af Amer: >60 mL/min (ref >60)
Glucose, Bld: 120 mg/dL — ABNORMAL HIGH (ref 70–99)
Potassium: 3.7 mmol/L (ref 3.5–5.1)
Sodium: 135 mmol/L (ref 135–145)

## 2020-07-16 LAB — CBC
HCT: 30.2 % — ABNORMAL LOW (ref 39.0–52.0)
Hemoglobin: 9.9 g/dL — ABNORMAL LOW (ref 13.0–17.0)
MCH: 33.4 pg (ref 26.0–34.0)
MCHC: 32.8 g/dL (ref 30.0–36.0)
MCV: 102 fL — ABNORMAL HIGH (ref 80.0–100.0)
Platelets: 210 10*3/uL (ref 150–400)
RBC: 2.96 MIL/uL — ABNORMAL LOW (ref 4.22–5.81)
RDW: 14.1 % (ref 11.5–15.5)
WBC: 6.8 10*3/uL (ref 4.0–10.5)
nRBC: 0.3 % — ABNORMAL HIGH (ref 0.0–0.2)

## 2020-07-16 MED ORDER — TRAMADOL HCL 50 MG PO TABS
50.0000 mg | ORAL_TABLET | Freq: Three times a day (TID) | ORAL | 0 refills | Status: AC | PRN
Start: 2020-07-16 — End: 2020-07-21

## 2020-07-16 MED ORDER — ISOSORBIDE MONONITRATE ER 30 MG PO TB24
15.0000 mg | ORAL_TABLET | Freq: Every day | ORAL | 12 refills | Status: DC
Start: 2020-07-17 — End: 2021-02-04

## 2020-07-16 MED ORDER — NITROGLYCERIN 0.4 MG SL SUBL: 0.4000 mg | SUBLINGUAL_TABLET | SUBLINGUAL | 12 refills | Status: DC | PRN

## 2020-07-16 MED ORDER — CIPROFLOXACIN HCL 500 MG PO TABS: 500.0000 mg | ORAL_TABLET | Freq: Two times a day (BID) | ORAL | 0 refills | Status: AC

## 2020-07-16 MED FILL — traMADol HCL 50 MG TABS: 50 | 5 days supply | Qty: 15 | Fill #0

## 2020-07-16 MED FILL — NITROGLYCERIN 0.4 MG TAB SL: 0.4 | 7 days supply | Qty: 25 | Fill #0

## 2020-07-16 MED FILL — ISOSORBIDE MN ER 30 MG TAB: 30 | 30 days supply | Qty: 30 | Fill #0

## 2020-07-16 NOTE — TOC Transition Note (Signed)
Transition of Care [facility]) - CM/SW Discharge Note   Patient Details  Name: Ian Harrison MRN: [id number] Date of Birth: 1927-02-07  Transition of [facility] CM/SW Contact:  Dessa Phi, RN Phone Number: 07/16/2020, 11:15 AM   Clinical Narrative: Ian Harrison for HHRN-f/c assessment;HHPT;[employer] rw delivered to rm prior d/c. No further CM needs.      Final next level of care: [place] Barriers to Discharge: No Barriers Identified   Patient Goals and CMS Choice Patient states their goals for this hospitalization and ongoing recovery are:: go home CMS Medicare.gov Compare Post Acute Care list provided to:: Patient Represenative (must comment) (spouse [name]) Choice offered to / list presented to : Spouse  Discharge Placement                       Discharge Plan and Services   Discharge Planning Services: CM Consult Post Acute Care Choice: Home Health, Durable Medical Equipment          DME Arranged: [place] rolling DME Agency: [employer] Date DME Agency Contacted: 07/14/20 Time DME Agency Contacted: [phone] Representative spoke with at DME Agency: Jodell [facility] HH Arranged: RN, PT [place] Date [place]: 07/16/20 Time [place]: 1115 Representative spoke with at [place]: Amy  Social Determinants of Health ([place]) Interventions     Readmission Risk Interventions No flowsheet data found.

## 2020-07-16 NOTE — Telephone Encounter (Signed)
Has pt been scheduled with urologist. I got follow up from hospital note. Call by Friday and see if hospital actually got him scheduled.

## 2020-07-16 NOTE — Telephone Encounter (Signed)
Patient is still in hospital , I will call Friday ... I see no urology referral placed in the chart .

## 2020-07-16 NOTE — Discharge Summary (Signed)
Physician Discharge Summary  Ian Harrison EXN:[id number] DOB: 10-06-1927 DOA: 07/09/2020  PCP: Mackie Pai, PA-C  Admit date: 07/09/2020 Discharge date: 07/16/2020  Time spent: 37 minutes  Recommendations for Outpatient Follow-up:  1. Requires CBC and Chem-12 in about 1 week 2. Outpatient follow-up with Dr. Herrick/Pace regarding consolidation of care with urology 3. Limited prescription of tramadol given may need further  Discharge Diagnoses:  Principal Problem:   Acute lower UTI Active Problems:   Coronary artery disease involving native coronary artery of native heart without angina pectoris   Essential hypertension   AAA (abdominal aortic aneurysm) without rupture (HCC)   Dementia without behavioral disturbance (HCC)   Generalized weakness   Chest pain   Hyperkalemia   Diabetes mellitus type II, non insulin dependent (HCC)   Chronic kidney disease   Chronic diastolic CHF (congestive heart failure) (HCC)   Elevated troponin   Discharge Condition: Improved  Diet recommendation: Heart healthy  Filed Weights   07/10/20 0749 07/12/20 0601 07/13/20 0835  Weight: 83 kg 84.6 kg 87.5 kg   prior MI requiring RCA stenting in 2005, NSTEMI 02/2015 with second RCA stent placement with residual 50% LCx disease ([place]), aortic aneurysm s/p repair in 2007, dyslipidemia intolerant to statins, DM2, CKD and dementia   History of present illness:  86 year old white male RCA stent 2009 NSTEMI 2016 ICA replacement, aortic aneurysm status post repair 2007, DM TY 2, hyperlipidemia, CKD Underwent cystoscopy 06/23/2020 and catheter was removed-followed up with urologist and was started on antibiotics for possible UTI-was unable to urinate without help and came to the ED On admission found to have leukocytosis 22.2 creatinine normal potassium 6.3 Patient started to have chest pain pressure with EKG showing slight T wave ST depressions Cardiology was consulted urology was  consulted  Hospital Course:  Acute cystitis with severe sepsis prior to admission Secondary to indwelling catheter and instrumentation-on admission lactate 2.2 WBC 23,000 Received IV Rocephin and transitioned to p.o. antibiotic-Dr. Louis Meckel notified patient was retaining to 60 cc of urine It was recommended that patient continue Foley catheter on discharge Patient was placed on ciprofloxacin for Pseudomonas and will complete the same in the outpatient setting  Non-STEMI in the setting of CAD and RCA stenting Patient had chest pain relieved by SL nitro-left heart cath showed nonobstructive CAD Added Imdur and nitroglycerin this admission Continue meds in the outpatient setting  Hyperkalemia this resolved with treatment of multiple issues and he stabilized on discharge  Acute kidney injury superimposed CKD stage III Resolved with IV fluids and further management Resume prior to admission meds but needs labs in about 1 week  Prediabetes A1c of 6 no further aggressive management given advanced age and risk of harm  Bladder mass?  Urinary retention Outpatient follow-up-there was sepsis 7X 4 cm hyperdense structure bladder base Further outpatient follow-up  Dementia with mild cognitive impairment He is oriented x3  Procedures:  Cardiac cath  1. Mild non-obstructive plaque in the mid LAD 2. Mild non-obstructive plaque in the mid Circumflex 3. The RCA is a large dominant vessel with moderate proximal and mid stenosis that does not appear to be flow limiting. Patent distal stented segment with minimal restenosis.    Recommendations: Medical management of CAD.    Consultations:  Cardiology  Urologist Dr. Randall Hiss  Discharge Exam: Vitals:   07/15/20 2112 07/16/20 0555  BP: 119/65 (!) 151/75  Pulse: 66 73  Resp: 16 18  Temp: 98.5 F (36.9 C) 98.2 F (36.8 C)  SpO2: 95% 95%  General: Awake alert coherent no distress EOMI NCAT no focal deficit bilateral breath  anxious Cardiovascular: S1-S2 no murmur rub or gallop Respiratory: Clear no added sounds Abdomen soft nontender no rebound no guarding he is wearing depends No lower extremity edema he has denudation on the right shin Neurologically intact  Discharge Instructions    Allergies as of 07/16/2020      Reactions   Atorvastatin Other (See Comments)   myalgia   Morphine And Related Other (See Comments)   Mood changes (anxious)      Medication List    STOP taking these medications   cephALEXin 500 MG capsule Commonly known as: KEFLEX     TAKE these medications   aspirin EC 81 MG tablet Take 81 mg by mouth daily with breakfast.   ciprofloxacin 500 MG tablet Commonly known as: CIPRO Take 1 tablet (500 mg total) by mouth 2 (two) times daily for 6 days.   clopidogrel 75 MG tablet Commonly known as: PLAVIX Take 1 tablet (75 mg total) by mouth daily. What changed: when to take this   isosorbide mononitrate 30 MG 24 hr tablet Commonly known as: IMDUR Take 0.5 tablets (15 mg total) by mouth daily. Start taking on: July 17, 2020   metoprolol succinate 25 MG 24 hr tablet Commonly known as: TOPROL-XL Take 1 tablet (25 mg total) by mouth daily. What changed: when to take this   nitroGLYCERIN 0.4 MG SL tablet Commonly known as: NITROSTAT Place 1 tablet (0.4 mg total) under the tongue every 5 (five) minutes as needed for chest pain.   tamsulosin 0.4 MG Caps capsule Commonly known as: FLOMAX Take 0.4 mg by mouth at bedtime.   traMADol 50 MG tablet Commonly known as: ULTRAM Take 1 tablet (50 mg total) by mouth every 8 (eight) hours as needed for up to 5 days for moderate pain.            Durable Medical Equipment  (From admission, onward)         Start     Ordered   07/14/20 1441  For home use only DME Walker rolling  Once       Question Answer Comment  Walker: With 5 Inch Wheels   Patient needs a walker to treat with the following condition Unsteady gait       07/14/20 1441         Allergies  Allergen Reactions  . Atorvastatin Other (See Comments)    myalgia  . Morphine And Related Other (See Comments)    Mood changes (anxious)    Follow-up Information    Tobb, Kardie, DO Follow up on 07/21/2020.   Specialty: Cardiology Why: Please arrive 15 minutes early for your 2:00pm post-hospital follow-up appointment. This is in our [facility] location.  Contact information: [place] [address] [phone]        [facility] Follow up.   Specialty: [place] Why: [facility] physical therapy Contact information: [place] [address] [phone]        [facility] Follow up.   Why: home rolling walker Contact information: [place] [address] [phone]        Hollace Hayward, NP In 1 week.   Why: Catheter removal Contact information: [place] [place] [address] [phone]                The results of significant diagnostics from this  hospitalization (including imaging, microbiology, ancillary and laboratory) are listed below for reference.    Significant Diagnostic Studies: CARDIAC CATHETERIZATION  Result Date: 07/14/2020  Prox RCA lesion is 40% stenosed.  Mid RCA to Dist RCA lesion is 10% stenosed.  Prox RCA to Mid RCA lesion is 50% stenosed.  Prox Cx to Mid Cx lesion is 40% stenosed.  Prox LAD to Mid LAD lesion is 20% stenosed.  1. Mild non-obstructive plaque in the mid LAD 2. Mild non-obstructive plaque in the mid Circumflex 3. The RCA is a large dominant vessel with moderate proximal and mid stenosis that does not appear to be flow limiting. Patent distal stented segment with minimal restenosis. Recommendations: Medical management of CAD.   DG Chest Portable 1 View  Result Date: 07/09/2020 CLINICAL DATA:  Weakness, urinary tract infection EXAM: PORTABLE CHEST 1 VIEW COMPARISON:  06/09/2020 FINDINGS: Single frontal  view of the chest demonstrates an unremarkable cardiac silhouette. No airspace disease, effusion, or pneumothorax. No acute bony abnormalities. IMPRESSION: 1. No acute intrathoracic process. Electronically Signed   By: Randa Ngo M.D.   On: 07/09/2020 23:45   CT Renal Stone Study  Result Date: 07/10/2020 CLINICAL DATA:  Urinary tract infection EXAM: CT ABDOMEN AND PELVIS WITHOUT CONTRAST TECHNIQUE: Multidetector CT imaging of the abdomen and pelvis was performed following the standard protocol without IV contrast. COMPARISON:  None. FINDINGS: LOWER CHEST: Normal. HEPATOBILIARY: Normal hepatic contours. No intra- or extrahepatic biliary dilatation. There is cholelithiasis without acute inflammation. PANCREAS: Normal pancreas. No ductal dilatation or peripancreatic fluid collection. SPLEEN: Normal. ADRENALS/URINARY TRACT: The adrenal glands are normal. No hydronephrosis, nephroureterolithiasis or solid renal mass. There are 2 calcifications in the dependent portion of the urinary bladder, each measuring approximately 2 mm. STOMACH/BOWEL: There is no hiatal hernia. Normal duodenal course and caliber. No small bowel dilatation or inflammation. No focal colonic abnormality. The appendix is not visualized. No right lower quadrant inflammation or free fluid. VASCULAR/LYMPHATIC: There is calcific atherosclerosis of the abdominal aorta. No lymphadenopathy. REPRODUCTIVE: Enlarged prostate measures 5.5 cm in transverse dimension. MUSCULOSKELETAL. No bony spinal canal stenosis or focal osseous abnormality. OTHER: None. IMPRESSION: 1. No obstructive uropathy or nephroureterolithiasis. Two calcifications in the dependent portion of the urinary bladder may be sequela of recently resected bladder tumor. 2. Cholelithiasis without acute inflammation. 3. Enlarged prostate. Aortic Atherosclerosis (ICD10-I70.0). Electronically Signed   By: Ulyses Jarred M.D.   On: 07/10/2020 02:32    Microbiology: Recent Results (from the  past 240 hour(s))  SARS Coronavirus 2 by RT PCR (hospital order, performed in [facility] hospital lab) Nasopharyngeal Nasopharyngeal Swab     Status: None   Collection Time: 07/09/20 11:07 PM   Specimen: Nasopharyngeal Swab  Result Value Ref Range Status   SARS Coronavirus 2 NEGATIVE NEGATIVE Final    Comment: (NOTE) SARS-CoV-2 target nucleic acids are NOT DETECTED.  The SARS-CoV-2 RNA is generally detectable in upper and lower respiratory specimens during the acute phase of infection. The lowest concentration of SARS-CoV-2 viral copies this assay can detect is 250 copies / mL. A negative result does not preclude SARS-CoV-2 infection and should not be used as the sole basis for treatment or other patient management decisions.  A negative result may occur with improper specimen collection / handling, submission of specimen other than nasopharyngeal swab, presence of viral mutation(s) within the areas targeted by this assay, and inadequate number of viral copies (<250 copies / mL). A negative result must be combined with clinical observations, patient history, and epidemiological  information.  Fact Sheet for Patients:   StrictlyIdeas.no  Fact Sheet for Healthcare Providers: BankingDealers.co.za  This test is not yet approved or  cleared by the [place] FDA and has been authorized for detection and/or diagnosis of SARS-CoV-2 by FDA under an Emergency Use Authorization (EUA).  This EUA will remain in effect (meaning this test can be used) for the duration of the COVID-19 declaration under Section 564(b)(1) of the Act, 21 U.S.C. section 360bbb-3(b)(1), unless the authorization is terminated or revoked sooner.  Performed at [facility], [place] [address] [place], [place] [address]   Urine culture     Status: Abnormal   Collection Time: 07/10/20  1:02 AM   Specimen: Urine, Clean Catch  Result Value Ref Range  Status   Specimen Description   Final    URINE, CLEAN CATCH Performed at [facility], [place] [address], [place], [place] [address]    Special Requests   Final    Normal Performed at [facility], [place] [address] [place], [place] [address]    Culture MULTIPLE SPECIES PRESENT, SUGGEST RECOLLECTION (A)  Final   Report Status 07/10/2020 FINAL  Final  Blood culture (routine x 2)     Status: None   Collection Time: 07/10/20  2:35 AM   Specimen: BLOOD  Result Value Ref Range Status   Specimen Description   Final    BLOOD RIGHT ANTECUBITAL Performed at [place] [address], [place], [place] [address]    Special Requests   Final    BOTTLES DRAWN AEROBIC AND ANAEROBIC Blood Culture adequate volume Performed at [place] [address] [place], [place] [address]    Culture   Final    NO GROWTH 5 DAYS Performed at [facility], [place] [address], [place], [place] [address]    Report Status 07/15/2020 FINAL  Final     Labs: Basic Metabolic Panel: Recent Labs  Lab 07/11/20 0420 07/12/20 0551 07/13/20 0518 07/14/20 0435 07/16/20 0528  NA 137 139 137 137 135  K 4.0 4.0 3.6 3.7 3.7  CL 104 108 104 107 104  CO2 24 22 22 22 24   GLUCOSE 132* 131* 130* 113* 120*  BUN 20 21 16 12 13   CREATININE 1.00 0.94 0.71 0.69 0.97  CALCIUM 8.3* 8.0* 8.0* 7.6* 7.8*  MG  --   --  2.2  --   --    Liver Function Tests: Recent Labs  Lab 07/09/20 2306 07/10/20 1008  AST 28 16  ALT 13 14  ALKPHOS 45 39  BILITOT 2.0* 1.4*  PROT 7.7 6.5  ALBUMIN 4.0 3.4*   No results for input(s): LIPASE, AMYLASE in the last 168 hours. No results for input(s): AMMONIA in the last 168 hours. CBC: Recent Labs  Lab 07/09/20 2306 07/10/20 0012 07/10/20 1008 07/10/20 1008 07/11/20 0420 07/12/20 0551 07/13/20 0518 07/14/20 0435 07/16/20 0528  WBC 22.2*   < > 23.3*   < > 17.5* 9.6 7.8 7.5 6.8  NEUTROABS 18.7*  --   20.3*  --   --  7.3 4.8 4.5  --   HGB 11.4*   < > 10.5*   < > 10.0* 9.9* 10.0* 9.4* 9.9*  HCT 35.8*   < > 31.4*   < > 30.7* 30.7* 31.1* 29.1* 30.2*  MCV 103.5*   < > 100.3*   < > 102.3* 103.7* 103.7* 102.8* 102.0*  PLT 188   < > 162   < >  145* 144* 169 166 210   < > = values in this interval not displayed.   Cardiac Enzymes: No results for input(s): CKTOTAL, CKMB, CKMBINDEX, TROPONINI in the last 168 hours. BNP: BNP (last 3 results) Recent Labs    07/10/20 1544  BNP 616.4*    ProBNP (last 3 results) No results for input(s): PROBNP in the last 8760 hours.  CBG: Recent Labs  Lab 07/10/20 0258  GLUCAP 178*       Signed:  Nita Sells MD   [facility] 07/16/2020, 10:23 AM

## 2020-07-16 NOTE — Progress Notes (Signed)
Coude catheter placed. IVs removed. AVS reviewed with patient and wife. Catheter education completed. Catheter supplies given. Pt transported downstairs in wheelchair. NAD.

## 2020-07-16 NOTE — Progress Notes (Signed)
Per verbal order from Dr. Louis Meckel, RN is to insert a coude catheter and pt is to be d/c with catheter in place. Pt will follow up with urology in one week to follow up about removing the catheter then. All necessary supplies will be sent home with patient in order to care for the catheter. Wife stated she understood how to care for the catheter at home.    Rise Paganini, RN

## 2020-07-16 NOTE — Progress Notes (Signed)
Patient not voiding well overnight.  Very little output, PVRs remain elevated.  Given increased risks of infection with elevated PVRs, I would suggest that we place a foley for the short term and have the patient follow-up early next week for a voiding trial.

## 2020-07-17 NOTE — Telephone Encounter (Signed)
Ok. Thanks!

## 2020-07-17 NOTE — Telephone Encounter (Signed)
I want to know if he has appointment already scheduled with urologist. If not then want to try to see him Monday or tuesay next week. Would want 40 minutes early morning or early afternoon appointment. If I can't see him before I am off on wed then let me know. May just go ahead an refer to urologist for follow up based on dc summary.

## 2020-07-17 NOTE — Telephone Encounter (Signed)
Patient's wife states husband has an urology appointment 9/29 .

## 2020-07-18 ENCOUNTER — Telehealth: Payer: Self-pay | Admitting: Medical

## 2020-07-18 NOTE — Telephone Encounter (Signed)
Can give verbal.

## 2020-07-18 NOTE — Telephone Encounter (Signed)
Left verbal on Betsy's voicemail

## 2020-07-18 NOTE — Telephone Encounter (Signed)
Caller: [place]  Call Back @ [phone]  Requesting a verbal order for physical therapy    For : 2x2         1x2

## 2020-07-19 ENCOUNTER — Encounter (HOSPITAL_BASED_OUTPATIENT_CLINIC_OR_DEPARTMENT_OTHER): Payer: Self-pay | Admitting: Emergency Medicine

## 2020-07-19 ENCOUNTER — Other Ambulatory Visit: Payer: Self-pay

## 2020-07-19 ENCOUNTER — Emergency Department (HOSPITAL_BASED_OUTPATIENT_CLINIC_OR_DEPARTMENT_OTHER)
Admission: EM | Admit: 2020-07-19 | Discharge: 2020-07-19 | Disposition: A | Discharge status: Home / Self Care | Payer: Medicare Other | Attending: Emergency Medicine | Admitting: Emergency Medicine

## 2020-07-19 DIAGNOSIS — Z87891 Personal history of nicotine dependence: Secondary | ICD-10-CM | POA: Diagnosis not present

## 2020-07-19 DIAGNOSIS — Y69 Unspecified misadventure during surgical and medical care: Secondary | ICD-10-CM | POA: Insufficient documentation

## 2020-07-19 DIAGNOSIS — T83198A Other mechanical complication of other urinary devices and implants, initial encounter: Secondary | ICD-10-CM | POA: Insufficient documentation

## 2020-07-19 DIAGNOSIS — Z7982 Long term (current) use of aspirin: Secondary | ICD-10-CM | POA: Insufficient documentation

## 2020-07-19 DIAGNOSIS — Z85828 Personal history of other malignant neoplasm of skin: Secondary | ICD-10-CM | POA: Insufficient documentation

## 2020-07-19 DIAGNOSIS — I2510 Atherosclerotic heart disease of native coronary artery without angina pectoris: Secondary | ICD-10-CM | POA: Insufficient documentation

## 2020-07-19 DIAGNOSIS — E1122 Type 2 diabetes mellitus with diabetic chronic kidney disease: Secondary | ICD-10-CM | POA: Diagnosis not present

## 2020-07-19 DIAGNOSIS — I130 Hypertensive heart and chronic kidney disease with heart failure and stage 1 through stage 4 chronic kidney disease, or unspecified chronic kidney disease: Secondary | ICD-10-CM | POA: Diagnosis not present

## 2020-07-19 DIAGNOSIS — F0390 Unspecified dementia without behavioral disturbance: Secondary | ICD-10-CM | POA: Insufficient documentation

## 2020-07-19 DIAGNOSIS — I5032 Chronic diastolic (congestive) heart failure: Secondary | ICD-10-CM | POA: Insufficient documentation

## 2020-07-19 DIAGNOSIS — Z79899 Other long term (current) drug therapy: Secondary | ICD-10-CM | POA: Insufficient documentation

## 2020-07-19 DIAGNOSIS — N189 Chronic kidney disease, unspecified: Secondary | ICD-10-CM | POA: Diagnosis not present

## 2020-07-19 DIAGNOSIS — T839XXA Unspecified complication of genitourinary prosthetic device, implant and graft, initial encounter: Secondary | ICD-10-CM

## 2020-07-19 LAB — BASIC METABOLIC PANEL
Anion gap: 8 (ref 5–15)
BUN: 15 mg/dL (ref 8–23)
CO2: 26 mmol/L (ref 22–32)
Calcium: 8.4 mg/dL — ABNORMAL LOW (ref 8.9–10.3)
Chloride: 100 mmol/L (ref 98–111)
Creatinine, Ser: 0.93 mg/dL (ref 0.61–1.24)
GFR calc Af Amer: >60 mL/min (ref >60)
GFR calc non Af Amer: >60 mL/min (ref >60)
Glucose, Bld: 118 mg/dL — ABNORMAL HIGH (ref 70–99)
Potassium: 3.8 mmol/L (ref 3.5–5.1)
Sodium: 134 mmol/L — ABNORMAL LOW (ref 135–145)

## 2020-07-19 LAB — URINALYSIS, ROUTINE W REFLEX MICROSCOPIC
Bilirubin Urine: NEGATIVE
Glucose, UA: NEGATIVE mg/dL
Ketones, ur: NEGATIVE mg/dL
Nitrite: NEGATIVE
Protein, ur: NEGATIVE mg/dL
Specific Gravity, Urine: 1.01 (ref 1.005–1.030)
pH: 7 (ref 5.0–8.0)

## 2020-07-19 LAB — URINALYSIS, MICROSCOPIC (REFLEX): Squamous Epithelial / HPF: NONE SEEN (ref 0–5)

## 2020-07-19 MED ORDER — FOSFOMYCIN TROMETHAMINE 3 G PO PACK
3.0000 g | PACK | Freq: Once | ORAL | Status: AC
  Administered 2020-07-19: 3 g via ORAL
  Filled 2020-07-19: qty 3

## 2020-07-19 NOTE — ED Provider Notes (Signed)
[facility] EMERGENCY DEPARTMENT Provider Note   CSN: [id number] Arrival date & time: 07/19/20  0316     History Chief Complaint  Patient presents with  . leaking around foley cath    Ian Harrison is a 86 y.o. male.  The history is provided by the patient and the spouse. The history is limited by the condition of the patient (level 5 caveat dementia ).  Illness Location:  Genitals  Quality:  Leaking around the foley catheter  Severity:  Moderate Onset quality:  Gradual Duration:  18 hours Timing:  Constant Progression:  Unchanged Chronicity:  New Context:  Has an indwelling foley catheter for urinary retention Relieved by:  Nothing  Worsened by:  Nothing  Ineffective treatments:  None  Associated symptoms: no diarrhea, no fatigue, no fever, no headaches, no loss of consciousness, no myalgias, no nausea and no vomiting   Risk factors:  Elderly       Past Medical History:  Diagnosis Date  . Aortic aneurysm ([place])   . CAD (coronary artery disease)   . Cancer [facility])    Skin cancer  . Chronic kidney disease   . Dementia ([place])   . Diabetes mellitus type II, non insulin dependent ([place])   . Dyslipidemia    Intolerance to atorvastatin due to myalgias  . Myocardial infarction ([place])    15 years ago  . Vasovagal syncope    Without recurrence    Patient Active Problem List   Diagnosis Date Noted  . Elevated troponin   . Generalized weakness 07/10/2020  . Acute lower UTI 07/10/2020  . Chest pain 07/10/2020  . Hyperkalemia 07/10/2020  . Chronic diastolic CHF (congestive heart failure) ([place]) 07/10/2020  . Diabetes mellitus type II, non insulin dependent ([place])   . Chronic kidney disease   . Dementia without behavioral disturbance ([place]) 09/24/2019  . Coronary artery disease involving native coronary artery of native heart without angina pectoris 09/03/2019  . Dyslipidemia 09/03/2019  . Essential hypertension 09/03/2019  . AAA (abdominal aortic aneurysm)  without rupture ([place]) 09/03/2019    Past Surgical History:  Procedure Laterality Date  . ABDOMINAL AORTIC ANEURYSM REPAIR    . APPENDECTOMY    . CATARACT EXTRACTION W/ INTRAOCULAR LENS IMPLANT    . CYSTOSCOPY WITH FULGERATION N/A 06/23/2020   Procedure: CYSTOSCOPY WITH CLOT EVACUATION;  Surgeon: Robley Fries, MD;  Location: [facility];  Service: Urology;  Laterality: N/A;  . HERNIA REPAIR    . LEFT HEART CATH AND CORONARY ANGIOGRAPHY N/A 07/14/2020   Procedure: LEFT HEART CATH AND CORONARY ANGIOGRAPHY;  Surgeon: Burnell Blanks, MD;  Location: [facility] CV [facility];  Service: Cardiovascular;  Laterality: N/A;  . TRANSURETHRAL RESECTION OF BLADDER TUMOR N/A 06/23/2020   Procedure: TRANSURETHRAL RESECTION OF BLADDER TUMOR (TURBT);  Surgeon: Robley Fries, MD;  Location: [facility];  Service: Urology;  Laterality: N/A;  90 MINS  . WISDOM TOOTH EXTRACTION         Family History  Problem Relation Age of Onset  . Heart attack Brother     Social History   Tobacco Use  . Smoking status: Former Research scientist (life sciences)  . Smokeless tobacco: Never Used  . Tobacco comment: Quit 17 years ago  Vaping Use  . Vaping Use: Never used  Substance Use Topics  . Alcohol use: Not Currently  . Drug use: Never    Home Medications Prior to Admission medications   Medication Sig Start Date End Date Taking? Authorizing Provider  aspirin EC 81  MG tablet Take 81 mg by mouth daily with breakfast.     [provider]  ciprofloxacin (CIPRO) 500 MG tablet Take 1 tablet (500 mg total) by mouth 2 (two) times daily for 6 days. 07/16/20 07/22/20  Nita Sells, MD  clopidogrel (PLAVIX) 75 MG tablet Take 1 tablet (75 mg total) by mouth daily. Patient taking differently: Take 75 mg by mouth daily with breakfast.  03/05/20   Tobb, Kardie, DO  isosorbide mononitrate (IMDUR) 30 MG 24 hr tablet Take 0.5 tablets (15 mg total) by mouth daily. 07/17/20   Nita Sells, MD  metoprolol succinate (TOPROL-XL) 25 MG  24 hr tablet Take 1 tablet (25 mg total) by mouth daily. Patient taking differently: Take 25 mg by mouth daily with breakfast.  03/05/20   Tobb, Kardie, DO  nitroGLYCERIN (NITROSTAT) 0.4 MG SL tablet Place 1 tablet (0.4 mg total) under the tongue every 5 (five) minutes as needed for chest pain. 07/16/20   Nita Sells, MD  tamsulosin (FLOMAX) 0.4 MG CAPS capsule Take 0.4 mg by mouth at bedtime. 07/01/20   [provider]  traMADol (ULTRAM) 50 MG tablet Take 1 tablet (50 mg total) by mouth every 8 (eight) hours as needed for up to 5 days for moderate pain. 07/16/20 07/21/20  Nita Sells, MD    Allergies    Atorvastatin and Morphine and related  Review of Systems   Review of Systems  Unable to perform ROS: Dementia  Constitutional: Negative for fatigue and fever.  Gastrointestinal: Negative for diarrhea, nausea and vomiting.  Genitourinary: Positive for difficulty urinating.  Musculoskeletal: Negative for myalgias.  Neurological: Negative for loss of consciousness and headaches.    Physical Exam Updated Vital Signs BP (!) 162/77 (BP Location: Right Arm)   Pulse 70   Temp 98 F (36.7 C) (Oral)   Resp 20   Ht 5\' 11"  (1.803 m) Comment: Simultaneous filing. User may not have seen previous data.  Wt 86.2 kg Comment: Simultaneous filing. User may not have seen previous data.  SpO2 98%   BMI 26.50 kg/m   Physical Exam Vitals and nursing note reviewed.  Constitutional:      General: He is not in acute distress.    Appearance: Normal appearance.  HENT:     Head: Normocephalic and atraumatic.     Nose: Nose normal.  Eyes:     Conjunctiva/sclera: Conjunctivae normal.     Pupils: Pupils are equal, round, and reactive to light.  Cardiovascular:     Rate and Rhythm: Normal rate and regular rhythm.     Pulses: Normal pulses.     Heart sounds: Normal heart sounds.  Pulmonary:     Effort: Pulmonary effort is normal.     Breath sounds: Normal breath sounds.    Abdominal:     General: Abdomen is flat. Bowel sounds are normal.     Palpations: Abdomen is soft.     Tenderness: There is no abdominal tenderness. There is no guarding or rebound.  Musculoskeletal:        General: Normal range of motion.     Cervical back: Normal range of motion and neck supple.  Skin:    General: Skin is warm and dry.     Capillary Refill: Capillary refill takes less than 2 seconds.  Neurological:     General: No focal deficit present.     Mental Status: He is alert and oriented to person, place, and time.     Deep Tendon Reflexes: Reflexes normal.  Psychiatric:        Mood and Affect: Mood normal.        Behavior: Behavior normal.     ED Results / Procedures / Treatments   Labs (all labs ordered are listed, but only abnormal results are displayed) Labs Reviewed  BASIC METABOLIC PANEL  URINALYSIS, ROUTINE W REFLEX MICROSCOPIC    EKG None  Radiology No results found.  Procedures Procedures (including critical care time)  Medications Ordered in ED Medications - No data to display  ED Course  I have reviewed the triage vital signs and the nursing notes.  Pertinent labs & imaging results that were available during my care of the patient were reviewed by me and considered in my medical decision making (see chart for details).    Large foley reinserted to prevent leakage.  Follow up with urology for ongoing care.    Ian Harrison was evaluated in Emergency Department on 07/19/2020 for the symptoms described in the history of present illness. He was evaluated in the context of the global COVID-19 pandemic, which necessitated consideration that the patient might be at risk for infection with the SARS-CoV-2 virus that causes COVID-19. Institutional protocols and algorithms that pertain to the evaluation of patients at risk for COVID-19 are in a state of rapid change based on information released by regulatory bodies including the CDC and federal and  state organizations. These policies and algorithms were followed during the patient's care in the ED.   Final Clinical Impression(s) / ED Diagnoses Return for intractable cough, coughing up blood,fevers >100.4 unrelieved by medication, shortness of breath, intractable vomiting, chest pain, shortness of breath, weakness,numbness, changes in speech, facial asymmetry,abdominal pain, passing out,Inability to tolerate liquids or food, cough, altered mental status or any concerns. No signs of systemic illness or infection. The patient is nontoxic-appearing on exam and vital signs are within normal limits.   I have reviewed the triage vital signs and the nursing notes. Pertinent labs &imaging results that were available during my care of the patient were reviewed by me and considered in my medical decision making (see chart for details).After history, exam, and medical workup I feel the patient has beenappropriately medically screened and is safe for discharge home. Pertinent diagnoses were discussed with the patient. Patient was given return precautions.      Fadumo Heng, MD 07/19/20 [id number]

## 2020-07-19 NOTE — ED Triage Notes (Signed)
Patient presents with complaints of leaking around foley catheter; patient was discharged from hospital with 18 french foley in place.

## 2020-07-20 LAB — URINE CULTURE
Culture: NO GROWTH
Special Requests: NORMAL

## 2020-07-21 ENCOUNTER — Ambulatory Visit: Payer: Medicare Other | Admitting: Cardiology

## 2020-07-28 ENCOUNTER — Other Ambulatory Visit: Payer: Self-pay | Admitting: Medical

## 2020-07-28 ENCOUNTER — Other Ambulatory Visit: Payer: Self-pay

## 2020-07-28 ENCOUNTER — Ambulatory Visit (INDEPENDENT_AMBULATORY_CARE_PROVIDER_SITE_OTHER): Payer: Medicare Other | Admitting: Medical

## 2020-07-28 VITALS — BP 119/79 | HR 83 | Resp 18 | Ht 71.0 in | Wt 175.4 lb

## 2020-07-28 DIAGNOSIS — N390 Urinary tract infection, site not specified: Secondary | ICD-10-CM

## 2020-07-28 DIAGNOSIS — Q819 Epidermolysis bullosa, unspecified: Secondary | ICD-10-CM | POA: Diagnosis not present

## 2020-07-28 DIAGNOSIS — N401 Enlarged prostate with lower urinary tract symptoms: Secondary | ICD-10-CM

## 2020-07-28 DIAGNOSIS — I10 Essential (primary) hypertension: Secondary | ICD-10-CM | POA: Diagnosis not present

## 2020-07-28 DIAGNOSIS — D619 Aplastic anemia, unspecified: Secondary | ICD-10-CM

## 2020-07-28 DIAGNOSIS — R319 Hematuria, unspecified: Secondary | ICD-10-CM

## 2020-07-28 MED ORDER — MUPIROCIN 2 % EX OINT: 1.0000 "application " | TOPICAL_OINTMENT | Freq: Two times a day (BID) | CUTANEOUS | 0 refills | Status: DC

## 2020-07-28 MED FILL — MUPIROCIN 2% OINTMENT: 2 | 10 days supply | Qty: 22 | Fill #0

## 2020-07-28 NOTE — Progress Notes (Signed)
Subjective:    Patient ID: Ian Harrison, male    DOB: 1927-03-09, 86 y.o.   MRN: [id number]  HPI  Pt in for follow up.  I was running 40 minutes behind he was very frustrated. I apologized.  Pt is following up with urologist tomorrow. His post void residual continue to show he is retaining urine. I reviewed notes today.  Pt and family appear to have wanted to avoid the indwelling catheter. He has 3 more days left of the cipro. Wife states he still may have blood in urine. Pt had pseudomonas.  Pt sugars were mild high when he was in hospital.          Review of Systems  Constitutional: Negative for chills, fatigue and fever.  Respiratory: Negative for chest tightness, shortness of breath and wheezing.   Cardiovascular: Negative for chest pain and palpitations.  Gastrointestinal: Negative for abdominal pain.  Endocrine: Negative for polydipsia and polyphagia.  Genitourinary: Negative for difficulty urinating, dysuria, flank pain, frequency, genital sores and testicular pain.  Musculoskeletal: Negative for back pain and myalgias.  Skin: Negative for rash.  Neurological: Negative for dizziness and light-headedness.  Hematological: Negative for adenopathy. Does not bruise/bleed easily.  Psychiatric/Behavioral: Negative for dysphoric mood. The patient is not nervous/anxious.     Past Medical History:  Diagnosis Date  . Aortic aneurysm ([place])   . CAD (coronary artery disease)   . Cancer [facility])    Skin cancer  . Chronic kidney disease   . Dementia ([place])   . Diabetes mellitus type II, non insulin dependent ([place])   . Dyslipidemia    Intolerance to atorvastatin due to myalgias  . Myocardial infarction ([place])    15 years ago  . Vasovagal syncope    Without recurrence     Social History   Socioeconomic History  . Marital status: Married    Spouse name: Not on file  . Number of children: Not on file  . Years of education: Not on file  . Highest education level: Not  on file  Occupational History  . Not on file  Tobacco Use  . Smoking status: Former Research scientist (life sciences)  . Smokeless tobacco: Never Used  . Tobacco comment: Quit 17 years ago  Vaping Use  . Vaping Use: Never used  Substance and Sexual Activity  . Alcohol use: Not Currently  . Drug use: Never  . Sexual activity: Not on file  Other Topics Concern  . Not on file  Social History Narrative  . Not on file   Social Determinants of Health   Financial Resource Strain:   . Difficulty of Paying Living Expenses: Not on file  Food Insecurity:   . Worried About Charity fundraiser in the Last Year: Not on file  . Ran Out of Food in the Last Year: Not on file  Transportation Needs:   . Lack of Transportation (Medical): Not on file  . Lack of Transportation (Non-Medical): Not on file  Physical Activity:   . Days of Exercise per Week: Not on file  . Minutes of Exercise per Session: Not on file  Stress:   . Feeling of Stress : Not on file  Social Connections:   . Frequency of Communication with Friends and Family: Not on file  . Frequency of Social Gatherings with Friends and Family: Not on file  . Attends Religious Services: Not on file  . Active Member of Clubs or Organizations: Not on file  . Attends Archivist Meetings:  Not on file  . Marital Status: Not on file  Intimate Partner Violence:   . Fear of Current or Ex-Partner: Not on file  . Emotionally Abused: Not on file  . Physically Abused: Not on file  . Sexually Abused: Not on file    Past Surgical History:  Procedure Laterality Date  . ABDOMINAL AORTIC ANEURYSM REPAIR    . APPENDECTOMY    . CATARACT EXTRACTION W/ INTRAOCULAR LENS IMPLANT    . CYSTOSCOPY WITH FULGERATION N/A 06/23/2020   Procedure: CYSTOSCOPY WITH CLOT EVACUATION;  Surgeon: Robley Fries, MD;  [facility];  Service: Urology;  Laterality: N/A;  . HERNIA REPAIR    . LEFT HEART CATH AND CORONARY ANGIOGRAPHY N/A 07/14/2020   Procedure: LEFT HEART CATH AND  CORONARY ANGIOGRAPHY;  Surgeon: Burnell Blanks, MD;  Location: [facility];  Service: Cardiovascular;  Laterality: N/A;  . TRANSURETHRAL RESECTION OF BLADDER TUMOR N/A 06/23/2020   Procedure: TRANSURETHRAL RESECTION OF BLADDER TUMOR (TURBT);  Surgeon: Robley Fries, MD;  Location[facility];  Service: Urology;  Laterality: N/A;  90 MINS  . WISDOM TOOTH EXTRACTION      Family History  Problem Relation Age of Onset  . Heart attack Brother     Allergies  Allergen Reactions  . Atorvastatin Other (See Comments)    myalgia  . Morphine And Related Other (See Comments)    Mood changes (anxious)    Current Outpatient Medications on File Prior to Visit  Medication Sig Dispense Refill  . aspirin EC 81 MG tablet Take 81 mg by mouth daily with breakfast.     . clopidogrel (PLAVIX) 75 MG tablet Take 1 tablet (75 mg total) by mouth daily. 90 tablet 1  . isosorbide mononitrate (IMDUR) 30 MG 24 hr tablet Take 0.5 tablets (15 mg total) by mouth daily. 30 tablet 12  . metoprolol succinate (TOPROL-XL) 25 MG 24 hr tablet Take 1 tablet (25 mg total) by mouth daily. (Patient taking differently: Take 25 mg by mouth daily with breakfast. ) 90 tablet 1  . nitroGLYCERIN (NITROSTAT) 0.4 MG SL tablet Place 1 tablet (0.4 mg total) under the tongue every 5 (five) minutes as needed for chest pain. 30 tablet 12  . tamsulosin (FLOMAX) 0.4 MG CAPS capsule Take 0.4 mg by mouth at bedtime.     No current facility-administered medications on file prior to visit.    BP 119/79   Pulse 83   Resp 18   Ht 5\' 11"  (1.803 m)   Wt 175 lb 6.4 oz (79.6 kg)   SpO2 100%   BMI 24.46 kg/m       Objective:   Physical Exam   General Mental Status- Alert. General Appearance- Not in acute distress.   Skin General: Color- Normal Color. Moisture- Normal Moisture.  Neck Carotid Arteries- Normal color. Moisture- Normal Moisture. No carotid bruits. No JVD.  Chest and Lung Exam Auscultation: Breath  Sounds:-Normal.  Cardiovascular Auscultation:Rythm- Regular. Murmurs & Other Heart Sounds:Auscultation of the heart reveals- No Murmurs.  Abdomen Inspection:-Inspeection Normal. Palpation/Percussion:Note:No mass. Palpation and Percussion of the abdomen reveal- Non Tender, Non Distended + BS, no rebound or guarding.   Neurologic Cranial Nerve exam:- CN III-XII intact(No nystagmus), symmetric smile. Strength:- 5/5 equal and symmetric strength both upper and lower extremities.      Assessment & Plan:  For recent uti, bph and urinary retention continue with cipro, see urologist tomorrow and continue flomax.  For dystrophic nails see Dr. Mallie Mussel podiatrist. Referral  placed. Keep rt toe area covered with bandaid. Avoid friction breakdown. rx mupirocin.  For anemia check cbc and iron level. Also follow wbc for uti.  For htn continue toprol xl.  Follow up 2-3 weeks or as needed.

## 2020-07-28 NOTE — Patient Instructions (Addendum)
For recent uti, bph and urinary retention continue with cipro, see urologist tomorrow and continue flomax.  For dystrophic nails see Dr. Mallie Mussel podiatrist. Referral placed. Keep rt toe area covered with bandaid. Avoid friction breakdown. rx mupirocin.  For anemia check cbc and iron level. Also follow wbc for uti.  For htn continue toprol xl.  Follow up 2-3 weeks

## 2020-07-29 ENCOUNTER — Telehealth: Payer: Self-pay | Admitting: *Deleted

## 2020-07-29 ENCOUNTER — Other Ambulatory Visit (HOSPITAL_BASED_OUTPATIENT_CLINIC_OR_DEPARTMENT_OTHER): Payer: Self-pay | Admitting: Adult Health

## 2020-07-29 ENCOUNTER — Telehealth: Payer: Self-pay | Admitting: Medical

## 2020-07-29 DIAGNOSIS — R739 Hyperglycemia, unspecified: Secondary | ICD-10-CM

## 2020-07-29 LAB — COMPREHENSIVE METABOLIC PANEL
AG Ratio: 1.6 (calc) (ref 1.0–2.5)
ALT: 12 U/L (ref 9–46)
AST: 14 U/L (ref 10–35)
Albumin: 4.2 g/dL (ref 3.6–5.1)
Alkaline phosphatase (APISO): 50 U/L (ref 35–144)
BUN: 20 mg/dL (ref 7–25)
CO2: 27 mmol/L (ref 20–32)
Calcium: 9.6 mg/dL (ref 8.6–10.3)
Chloride: 102 mmol/L (ref 98–110)
Creat: 1.04 mg/dL (ref 0.70–1.11)
Globulin: 2.7 g/dL (calc) (ref 1.9–3.7)
Glucose, Bld: 101 mg/dL — ABNORMAL HIGH (ref 65–99)
Potassium: 4.9 mmol/L (ref 3.5–5.3)
Sodium: 139 mmol/L (ref 135–146)
Total Bilirubin: 1 mg/dL (ref 0.2–1.2)
Total Protein: 6.9 g/dL (ref 6.1–8.1)

## 2020-07-29 LAB — CBC WITH DIFFERENTIAL/PLATELET
Absolute Monocytes: 387 cells/uL (ref 200–950)
Basophils Absolute: 80 cells/uL (ref 0–200)
Basophils Relative: 1.5 %
Eosinophils Absolute: 58 cells/uL (ref 15–500)
Eosinophils Relative: 1.1 %
HCT: 36.7 % — ABNORMAL LOW (ref 38.5–50.0)
Hemoglobin: 12 g/dL — ABNORMAL LOW (ref 13.2–17.1)
Lymphs Abs: 2465 cells/uL (ref 850–3900)
MCH: 32.3 pg (ref 27.0–33.0)
MCHC: 32.7 g/dL (ref 32.0–36.0)
MCV: 98.7 fL (ref 80.0–100.0)
MPV: 11.9 fL (ref 7.5–12.5)
Monocytes Relative: 7.3 %
Neutro Abs: 2311 cells/uL (ref 1500–7800)
Neutrophils Relative %: 43.6 %
Platelets: 254 10*3/uL (ref 140–400)
RBC: 3.72 10*6/uL — ABNORMAL LOW (ref 4.20–5.80)
RDW: 13.5 % (ref 11.0–15.0)
Total Lymphocyte: 46.5 %
WBC: 5.3 10*3/uL (ref 3.8–10.8)

## 2020-07-29 LAB — IRON: Iron: 137 ug/dL (ref 50–180)

## 2020-07-29 MED FILL — TAMSULOSIN HCL 0.4 MG CAP: 0.4 | 30 days supply | Qty: 60 | Fill #0

## 2020-07-29 NOTE — Telephone Encounter (Signed)
a1c future order placed.

## 2020-07-29 NOTE — Telephone Encounter (Signed)
Future a1c placed. Pt can get done in early December.

## 2020-07-29 NOTE — Telephone Encounter (Signed)
Pt was seen in office yesterday and had labs.  Pt has lab appt scheduled for tomorrow but I do not see any future orders in Epic.  Can you place future orders if appropriate or call pt and cancel appt if not needed?

## 2020-07-30 ENCOUNTER — Other Ambulatory Visit: Payer: Medicare Other

## 2020-07-30 NOTE — Telephone Encounter (Signed)
Attempted to reach pt at home #. Left message to call the office before coming in for lab appt today. Spoke with pt's daughter at cell# listed in chart and advised lab appt not due today and to have pt call and schedule lab appt for early December. Today's appt has been cancelled.

## 2020-08-06 ENCOUNTER — Encounter: Payer: Self-pay | Admitting: Cardiology

## 2020-08-06 ENCOUNTER — Other Ambulatory Visit: Payer: Self-pay

## 2020-08-06 ENCOUNTER — Ambulatory Visit: Payer: Medicare Other | Admitting: Cardiology

## 2020-08-06 VITALS — BP 120/60 | HR 70 | Ht 71.0 in | Wt 177.0 lb

## 2020-08-06 DIAGNOSIS — E119 Type 2 diabetes mellitus without complications: Secondary | ICD-10-CM | POA: Diagnosis not present

## 2020-08-06 DIAGNOSIS — I5032 Chronic diastolic (congestive) heart failure: Secondary | ICD-10-CM

## 2020-08-06 DIAGNOSIS — N183 Chronic kidney disease, stage 3 unspecified: Secondary | ICD-10-CM

## 2020-08-06 DIAGNOSIS — I10 Essential (primary) hypertension: Secondary | ICD-10-CM | POA: Diagnosis not present

## 2020-08-06 DIAGNOSIS — I2510 Atherosclerotic heart disease of native coronary artery without angina pectoris: Secondary | ICD-10-CM

## 2020-08-06 NOTE — Patient Instructions (Signed)

## 2020-08-06 NOTE — Progress Notes (Signed)
Cardiology Office Note:    Date:  08/06/2020   ID:  Ian Harrison, DOB [birth year], MRN [id number]  PCP:  Mackie Pai, PA-C  Cardiologist:  Berniece Salines, DO  Electrophysiologist:  None   Referring MD: Elise Benne   Chief Complaint  Patient presents with  . Hospitalization Follow-up    History of Present Illness:    Ian Harrison a 86 y.o.malewith a hx of CAD With prior MI requiring RCA stent on 2005, NSTEMI in 02/2015 with another stent placement to his RCA, residual 50% Lcx disease, Aortic Anuerysm status post repair in 2007, Dyslipidemia and was taken off atorvastatin due to myalgias., Type II DM, CKD and Dementia.  I recently saw the patient in July 2021 at that time he appears to be doing well from a cardiovascular standpoint.  However in the interim he was hospitalized and underwent a heart catheterization which showed mild to moderate CAD with minimal RCA stent restenosis.  He is here today for follow-up visit.  He and his wife are both in the office he offers no complaints and she does not have any complaints either.  Past Medical History:  Diagnosis Date  . Aortic aneurysm ([place])   . CAD (coronary artery disease)   . Cancer [facility])    Skin cancer  . Chronic kidney disease   . Dementia ([place])   . Diabetes mellitus type II, non insulin dependent ([place])   . Dyslipidemia    Intolerance to atorvastatin due to myalgias  . Myocardial infarction ([place])    15 years ago  . Vasovagal syncope    Without recurrence    Past Surgical History:  Procedure Laterality Date  . ABDOMINAL AORTIC ANEURYSM REPAIR    . APPENDECTOMY    . CATARACT EXTRACTION W/ INTRAOCULAR LENS IMPLANT    . CYSTOSCOPY WITH FULGERATION N/A 06/23/2020   Procedure: CYSTOSCOPY WITH CLOT EVACUATION;  Surgeon: Robley Fries, MD;  Location: [facility];  Service: Urology;  Laterality: N/A;  . HERNIA REPAIR    . LEFT HEART CATH AND CORONARY ANGIOGRAPHY N/A 07/14/2020   Procedure: LEFT HEART CATH  AND CORONARY ANGIOGRAPHY;  Surgeon: Burnell Blanks, MD;  Location: [place] [facility];  Service: Cardiovascular;  Laterality: N/A;  . TRANSURETHRAL RESECTION OF BLADDER TUMOR N/A 06/23/2020   Procedure: TRANSURETHRAL RESECTION OF BLADDER TUMOR (TURBT);  Surgeon: Robley Fries, MD;  Location: [facility];  Service: Urology;  Laterality: N/A;  90 MINS  . WISDOM TOOTH EXTRACTION      Current Medications: Current Meds  Medication Sig  . aspirin EC 81 MG tablet Take 81 mg by mouth daily with breakfast.   . clopidogrel (PLAVIX) 75 MG tablet Take 1 tablet (75 mg total) by mouth daily.  . isosorbide mononitrate (IMDUR) 30 MG 24 hr tablet Take 0.5 tablets (15 mg total) by mouth daily.  . metoprolol succinate (TOPROL-XL) 25 MG 24 hr tablet Take 1 tablet (25 mg total) by mouth daily.  . mupirocin ointment (BACTROBAN) 2 % Apply 1 application topically 2 (two) times daily.  . nitroGLYCERIN (NITROSTAT) 0.4 MG SL tablet Place 1 tablet (0.4 mg total) under the tongue every 5 (five) minutes as needed for chest pain.  . tamsulosin (FLOMAX) 0.4 MG CAPS capsule Take 0.4 mg by mouth at bedtime.     Allergies:   Atorvastatin and Morphine and related   Social History   Socioeconomic History  . Marital status: Married    Spouse name: Not on file  . Number of  children: Not on file  . Years of education: Not on file  . Highest education level: Not on file  Occupational History  . Not on file  Tobacco Use  . Smoking status: Former Research scientist (life sciences)  . Smokeless tobacco: Never Used  . Tobacco comment: Quit 17 years ago  Vaping Use  . Vaping Use: Never used  Substance and Sexual Activity  . Alcohol use: Not Currently  . Drug use: Never  . Sexual activity: Not on file  Other Topics Concern  . Not on file  Social History Narrative  . Not on file   Social Determinants of Health   Financial Resource Strain:   . Difficulty of Paying Living Expenses: Not on file  Food Insecurity:   . Worried About Paediatric nurse in the Last Year: Not on file  . Ran Out of Food in the Last Year: Not on file  Transportation Needs:   . Lack of Transportation (Medical): Not on file  . Lack of Transportation (Non-Medical): Not on file  Physical Activity:   . Days of Exercise per Week: Not on file  . Minutes of Exercise per Session: Not on file  Stress:   . Feeling of Stress : Not on file  Social Connections:   . Frequency of Communication with Friends and Family: Not on file  . Frequency of Social Gatherings with Friends and Family: Not on file  . Attends Religious Services: Not on file  . Active Member of Clubs or Organizations: Not on file  . Attends Archivist Meetings: Not on file  . Marital Status: Not on file     Family History: The patient's family history includes Heart attack in his brother.  ROS:   Review of Systems  Constitution: Negative for decreased appetite, fever and weight gain.  HENT: Negative for congestion, ear discharge, hoarse voice and sore throat.   Eyes: Negative for discharge, redness, vision loss in right eye and visual halos.  Cardiovascular: Negative for chest pain, dyspnea on exertion, leg swelling, orthopnea and palpitations.  Respiratory: Negative for cough, hemoptysis, shortness of breath and snoring.   Endocrine: Negative for heat intolerance and polyphagia.  Hematologic/Lymphatic: Negative for bleeding problem. Does not bruise/bleed easily.  Skin: Negative for flushing, nail changes, rash and suspicious lesions.  Musculoskeletal: Negative for arthritis, joint pain, muscle cramps, myalgias, neck pain and stiffness.  Gastrointestinal: Negative for abdominal pain, bowel incontinence, diarrhea and excessive appetite.  Genitourinary: Negative for decreased libido, genital sores and incomplete emptying.  Neurological: Negative for brief paralysis, focal weakness, headaches and loss of balance.  Psychiatric/Behavioral: Negative for altered mental status,  depression and suicidal ideas.  Allergic/Immunologic: Negative for HIV exposure and persistent infections.    EKGs/Labs/Other Studies Reviewed:    The following studies were reviewed today:   EKG: None today  LHC  Prox RCA lesion is 40% stenosed.  Mid RCA to Dist RCA lesion is 10% stenosed.  Prox RCA to Mid RCA lesion is 50% stenosed.  Prox Cx to Mid Cx lesion is 40% stenosed.  Prox LAD to Mid LAD lesion is 20% stenosed.   1. Mild non-obstructive plaque in the mid LAD 2. Mild non-obstructive plaque in the mid Circumflex 3. The RCA is a large dominant vessel with moderate proximal and mid stenosis that does not appear to be flow limiting. Patent distal stented segment with minimal restenosis.   Recommendations: Medical management of CAD.     Recent Labs: 07/10/2020: B Natriuretic Peptide 616.4 07/13/2020:  Magnesium 2.2 07/28/2020: ALT 12; BUN 20; Creat 1.04; Hemoglobin 12.0; Platelets 254; Potassium 4.9; Sodium 139  Recent Lipid Panel No results found for: CHOL, TRIG, HDL, CHOLHDL, VLDL, LDLCALC, LDLDIRECT  Physical Exam:    VS:  BP 120/60 (BP Location: Left Arm, Patient Position: Sitting, Cuff Size: Normal)   Pulse 70   Ht 5\' 11"  (1.803 m)   Wt 177 lb (80.3 kg)   SpO2 97%   BMI 24.69 kg/m     Wt Readings from Last 3 Encounters:  08/06/20 177 lb (80.3 kg)  07/28/20 175 lb 6.4 oz (79.6 kg)  07/19/20 190 lb (86.2 kg)     GEN: Well nourished, well developed in no acute distress HEENT: Normal NECK: No JVD; No carotid bruits LYMPHATICS: No lymphadenopathy CARDIAC: S1S2 noted,RRR, no murmurs, rubs, gallops RESPIRATORY:  Clear to auscultation without rales, wheezing or rhonchi  ABDOMEN: Soft, non-tender, non-distended, +bowel sounds, no guarding. EXTREMITIES: No edema, No cyanosis, no clubbing MUSCULOSKELETAL:  No deformity  SKIN: Warm and dry NEUROLOGIC:  Alert and oriented x 3, non-focal PSYCHIATRIC:  Normal affect, good insight  ASSESSMENT:    1.  Coronary artery disease involving native coronary artery of native heart without angina pectoris   2. Essential hypertension   3. Chronic diastolic CHF (congestive heart failure) ([place])   4. Diabetes mellitus type II, non insulin dependent (HCC)   5. Stage 3 chronic kidney disease, unspecified whether stage 3a or 3b CKD ([place])    PLAN:    He appears to be doing well from a cardiovascular standpoint.  Note complaints.  His blood pressure deceptively in the office today.  We will continue patient's current medication regimen.  Diabetes mellitus per his PCP.  CAD-no anginal symptoms continue patient's current medication regimen.  He has significant statin allergies and prefers to be off statin continue with antiplatelet therapy.  No signs of fluid overload.  The patient is in agreement with the above plan. The patient left the office in stable condition.  The patient will follow up in   Medication Adjustments/Labs and Tests Ordered: Current medicines are reviewed at length with the patient today.  Concerns regarding medicines are outlined above.  No orders of the defined types were placed in this encounter.  No orders of the defined types were placed in this encounter.   Patient Instructions  Medication Instructions:  Your physician recommends that you continue on your current medications as directed. Please refer to the Current Medication list given to you today.  *If you need a refill on your cardiac medications before your next appointment, please call your pharmacy*   Lab Work: NONE If you have labs (blood work) drawn today and your tests are completely normal, you will receive your results only by: Marland Kitchen MyChart Message (if you have MyChart) OR . A paper copy in the mail If you have any lab test that is abnormal or we need to change your treatment, we will call you to review the results.   Testing/Procedures: NONE   Follow-Up: At [facility], you and your health needs are  our priority.  As part of our continuing mission to provide you with exceptional heart care, we have created designated Provider Care Teams.  These Care Teams include your primary Cardiologist (physician) and Advanced Practice Providers (APPs -  Physician Assistants and Nurse Practitioners) who all work together to provide you with the care you need, when you need it.  We recommend signing up for the patient portal called "MyChart".  Sign up information is provided on this After Visit Summary.  MyChart is used to connect with patients for Virtual Visits (Telemedicine).  Patients are able to view lab/test results, encounter notes, upcoming appointments, etc.  Non-urgent messages can be sent to your provider as well.   To learn more about what you can do with MyChart, go to [email or web address].    Your next appointment:   6 month(s)  The format for your next appointment:   In Person  Provider:   Berniece Salines, DO   Other Instructions      Adopting a Healthy Lifestyle.  Know what a healthy weight is for you (roughly BMI <25) and aim to maintain this   Aim for 7+ servings of fruits and vegetables daily   65-80+ fluid ounces of water or unsweet tea for healthy kidneys   Limit to max 1 drink of alcohol per day; avoid smoking/tobacco   Limit animal fats in diet for cholesterol and heart health - choose grass fed whenever available   Avoid highly processed foods, and foods high in saturated/trans fats   Aim for low stress - take time to unwind and care for your mental health   Aim for 150 min of moderate intensity exercise weekly for heart health, and weights twice weekly for bone health   Aim for 7-9 hours of sleep daily   When it comes to diets, agreement about the perfect plan isnt easy to find, even among the experts. Experts at the [place] developed an idea known as the Healthy Eating Plate. Just imagine a plate divided into logical, healthy  portions.   The emphasis is on diet quality:   Load up on vegetables and fruits - one-half of your plate: Aim for color and variety, and remember that potatoes dont count.   Go for whole grains - one-quarter of your plate: Whole wheat, barley, wheat berries, quinoa, oats, brown rice, and foods made with them. If you want pasta, go with whole wheat pasta.   Protein power - one-quarter of your plate: Fish, chicken, beans, and nuts are all healthy, versatile protein sources. Limit red meat.   The diet, however, does go beyond the plate, offering a few other suggestions.   Use healthy plant oils, such as olive, canola, soy, corn, sunflower and peanut. Check the labels, and avoid partially hydrogenated oil, which have unhealthy trans fats.   If youre thirsty, drink water. Coffee and tea are good in moderation, but skip sugary drinks and limit milk and dairy products to one or two daily servings.   The type of carbohydrate in the diet is more important than the amount. Some sources of carbohydrates, such as vegetables, fruits, whole grains, and beans-are healthier than others.   Finally, stay active  Signed, Berniece Salines, DO  08/06/2020 2:37 PM    [place]

## 2020-08-15 ENCOUNTER — Ambulatory Visit: Payer: Medicare Other | Admitting: Medical

## 2020-08-29 MED FILL — TAMSULOSIN HCL 0.4 MG CAP: 0.4 | 30 days supply | Qty: 60 | Fill #1

## 2020-09-17 ENCOUNTER — Other Ambulatory Visit: Payer: Self-pay | Admitting: Cardiology

## 2020-09-17 MED FILL — METOPROLOL SUCCINATE ER 25: 25 | 90 days supply | Qty: 90 | Fill #0

## 2020-09-24 ENCOUNTER — Other Ambulatory Visit (INDEPENDENT_AMBULATORY_CARE_PROVIDER_SITE_OTHER): Payer: Medicare Other

## 2020-09-24 ENCOUNTER — Other Ambulatory Visit: Payer: Self-pay

## 2020-09-24 DIAGNOSIS — R739 Hyperglycemia, unspecified: Secondary | ICD-10-CM | POA: Diagnosis not present

## 2020-09-25 LAB — HEMOGLOBIN A1C
Hgb A1c MFr Bld: 5.6 % of total Hgb (ref <5.7)
Mean Plasma Glucose: 114 (calc)
eAG (mmol/L): 6.3 (calc)

## 2020-09-29 MED FILL — TAMSULOSIN HCL 0.4 MG CAP: 0.4 | 30 days supply | Qty: 60 | Fill #2

## 2020-09-30 ENCOUNTER — Ambulatory Visit: Payer: Medicare Other | Admitting: Medical

## 2020-10-03 ENCOUNTER — Other Ambulatory Visit: Payer: Self-pay

## 2020-10-03 ENCOUNTER — Ambulatory Visit (INDEPENDENT_AMBULATORY_CARE_PROVIDER_SITE_OTHER): Payer: Medicare Other | Admitting: Medical

## 2020-10-03 VITALS — BP 138/41 | HR 63 | Resp 18 | Ht 71.0 in | Wt 184.0 lb

## 2020-10-03 DIAGNOSIS — Z23 Encounter for immunization: Secondary | ICD-10-CM | POA: Diagnosis not present

## 2020-10-03 DIAGNOSIS — I10 Essential (primary) hypertension: Secondary | ICD-10-CM | POA: Diagnosis not present

## 2020-10-03 DIAGNOSIS — R739 Hyperglycemia, unspecified: Secondary | ICD-10-CM | POA: Diagnosis not present

## 2020-10-03 DIAGNOSIS — I2510 Atherosclerotic heart disease of native coronary artery without angina pectoris: Secondary | ICD-10-CM | POA: Diagnosis not present

## 2020-10-03 DIAGNOSIS — E785 Hyperlipidemia, unspecified: Secondary | ICD-10-CM

## 2020-10-03 NOTE — Progress Notes (Signed)
fol

## 2020-10-03 NOTE — Patient Instructions (Addendum)
Htn- bp well controlled. Continue toprol.  For elevated sugar a1c low/controlled. Continue low sugar diet.  For high cholesterol and CAD hx continue with low cholesterol diet. Future cmp and lipid panel to get done fasting. Reaction to statin in past.  For hx of bph continue flomax.  Flu vaccine today.  Follow up date 6 months or as needed.

## 2020-10-03 NOTE — Addendum Note (Signed)
Addended by: Jeronimo Greaves on: 10/03/2020 10:26 AM   Modules accepted: Orders

## 2020-10-03 NOTE — Progress Notes (Signed)
Subjective:    Patient ID: Ian Harrison, male    DOB: [birth year], 86 y.o.   MRN: [id number]  HPI  Pt in for follow up.  Pt has htn. He is on toprol.   Hx of bph. Pt is flomax.   Hx of chf, diabetes and ckd.  Pt last a1c 9 days ago was 5.6. hx of prediabetic.  Pt has gotten covid booster. They want flu vaccine.   Hx of CAD. Pt not using imdur. Wife states not giving since he does not have chest pain.  Labs in October looked good.    Review of Systems  Constitutional: Negative for chills and fatigue.  Respiratory: Negative for cough, chest tightness, shortness of breath and wheezing.   Cardiovascular: Negative for chest pain and palpitations.  Gastrointestinal: Negative for abdominal pain, diarrhea, nausea and vomiting.  Genitourinary: Negative for decreased urine volume, dysuria, frequency and hematuria.  Musculoskeletal: Negative for back pain.  Skin: Negative for rash.  Neurological: Negative for dizziness and headaches.  Hematological: Negative for adenopathy. Does not bruise/bleed easily.  Psychiatric/Behavioral: Negative for behavioral problems and confusion.     Past Medical History:  Diagnosis Date  . Aortic aneurysm ([place])   . CAD (coronary artery disease)   . Cancer [facility])    Skin cancer  . Chronic kidney disease   . Dementia ([place])   . Diabetes mellitus type II, non insulin dependent ([place])   . Dyslipidemia    Intolerance to atorvastatin due to myalgias  . Myocardial infarction ([place])    15 years ago  . Vasovagal syncope    Without recurrence     Social History   Socioeconomic History  . Marital status: Married    Spouse name: Not on file  . Number of children: Not on file  . Years of education: Not on file  . Highest education level: Not on file  Occupational History  . Not on file  Tobacco Use  . Smoking status: Former Research scientist (life sciences)  . Smokeless tobacco: Never Used  . Tobacco comment: Quit 17 years ago  Vaping Use  . Vaping Use: Never used   Substance and Sexual Activity  . Alcohol use: Not Currently  . Drug use: Never  . Sexual activity: Not on file  Other Topics Concern  . Not on file  Social History Narrative  . Not on file   Social Determinants of Health   Financial Resource Strain: Not on file  Food Insecurity: Not on file  Transportation Needs: Not on file  Physical Activity: Not on file  Stress: Not on file  Social Connections: Not on file  Intimate Partner Violence: Not on file    Past Surgical History:  Procedure Laterality Date  . ABDOMINAL AORTIC ANEURYSM REPAIR    . APPENDECTOMY    . CATARACT EXTRACTION W/ INTRAOCULAR LENS IMPLANT    . CYSTOSCOPY WITH FULGERATION N/A 06/23/2020   Procedure: CYSTOSCOPY WITH CLOT EVACUATION;  Surgeon: Robley Fries, MD;  [facility];  Service: Urology;  Laterality: N/A;  . HERNIA REPAIR    . LEFT HEART CATH AND CORONARY ANGIOGRAPHY N/A 07/14/2020   Procedure: LEFT HEART CATH AND CORONARY ANGIOGRAPHY;  Surgeon: Burnell Blanks, MD;  Location: [place] [facility] LAB;  Service: Cardiovascular;  Laterality: N/A;  . TRANSURETHRAL RESECTION OF BLADDER TUMOR N/A 06/23/2020   Procedure: TRANSURETHRAL RESECTION OF BLADDER TUMOR (TURBT);  Surgeon: Robley Fries, MD;  [facility];  Service: Urology;  Laterality: N/A;  90 MINS  .  WISDOM TOOTH EXTRACTION      Family History  Problem Relation Age of Onset  . Heart attack Brother     Allergies  Allergen Reactions  . Atorvastatin Other (See Comments)    myalgia  . Morphine And Related Other (See Comments)    Mood changes (anxious)    Current Outpatient Medications on File Prior to Visit  Medication Sig Dispense Refill  . aspirin EC 81 MG tablet Take 81 mg by mouth daily with breakfast.     . clopidogrel (PLAVIX) 75 MG tablet Take 1 tablet (75 mg total) by mouth daily. 90 tablet 1  . isosorbide mononitrate (IMDUR) 30 MG 24 hr tablet Take 0.5 tablets (15 mg total) by mouth daily. 30 tablet 12  .  metoprolol succinate (TOPROL-XL) 25 MG 24 hr tablet TAKE 1 TABLET (25 MG TOTAL) BY MOUTH DAILY. 90 tablet 1  . mupirocin ointment (BACTROBAN) 2 % Apply 1 application topically 2 (two) times daily. 22 g 0  . nitroGLYCERIN (NITROSTAT) 0.4 MG SL tablet Place 1 tablet (0.4 mg total) under the tongue every 5 (five) minutes as needed for chest pain. 30 tablet 12  . tamsulosin (FLOMAX) 0.4 MG CAPS capsule Take 0.4 mg by mouth at bedtime.     No current facility-administered medications on file prior to visit.    BP (!) 138/41   Pulse 63   Resp 18   Ht 5\' 11"  (1.803 m)   Wt 184 lb (83.5 kg)   SpO2 98%   BMI 25.66 kg/m       Objective:   Physical Exam  General Mental Status- Alert. General Appearance- Not in acute distress.   Skin General: Color- Normal Color. Moisture- Normal Moisture.  Neck Carotid Arteries- Normal color. Moisture- Normal Moisture. No carotid bruits. No JVD.  Chest and Lung Exam Auscultation: Breath Sounds:-Normal.  Cardiovascular Auscultation:Rythm- Regular. Murmurs & Other Heart Sounds:Auscultation of the heart reveals- No Murmurs.  Abdomen Inspection:-Inspeection Normal. Palpation/Percussion:Note:No mass. Palpation and Percussion of the abdomen reveal- Non Tender, Non Distended + BS, no rebound or guarding.    Neurologic Cranial Nerve exam:- CN III-XII intact(No nystagmus), symmetric smile. Strength:- 5/5 equal and symmetric strength both upper and lower extremities.      Assessment & Plan:  Htn- bp well controlled. Continue toprol.  For elevated sugar a1c low/controlled. Continue low sugar diet.  For high cholesterol and CAD hx continue with low cholesterol diet. Future cmp and lipid panel to get done fasting. Reaction to statin in past.  For hx of bph continue flomax.  Follow up date 6 months or as needed.  Mackie Pai, PA-C

## 2020-10-20 ENCOUNTER — Other Ambulatory Visit: Payer: Self-pay | Admitting: Cardiology

## 2020-10-20 MED FILL — CLOPIDOGREL 75 MG TABLET: 75 | 90 days supply | Qty: 90 | Fill #0

## 2020-10-20 NOTE — Telephone Encounter (Signed)
Refill sent to pharmacy.   

## 2020-11-01 MED FILL — TAMSULOSIN HCL 0.4 MG CAP: 0.4 | 30 days supply | Qty: 30 | Fill #1

## 2020-11-19 MED FILL — TAMSULOSIN HCL 0.4 MG CAP: 0.4 | 30 days supply | Qty: 60 | Fill #3

## 2020-12-19 MED FILL — METOPROLOL SUCCINATE ER 25: 25 | 90 days supply | Qty: 90 | Fill #1

## 2020-12-19 MED FILL — TAMSULOSIN HCL 0.4 MG CAP: 0.4 | 30 days supply | Qty: 60 | Fill #4

## 2021-01-19 MED FILL — CLOPIDOGREL 75 MG TABLET: 75 | 90 days supply | Qty: 90 | Fill #1

## 2021-01-26 ENCOUNTER — Other Ambulatory Visit (HOSPITAL_BASED_OUTPATIENT_CLINIC_OR_DEPARTMENT_OTHER): Payer: Self-pay

## 2021-01-26 MED FILL — Tamsulosin HCl Cap 0.4 MG: ORAL | 30 days supply | Qty: 60 | Fill #0 | Status: AC

## 2021-01-27 ENCOUNTER — Other Ambulatory Visit (HOSPITAL_BASED_OUTPATIENT_CLINIC_OR_DEPARTMENT_OTHER): Payer: Self-pay

## 2021-02-02 DIAGNOSIS — I719 Aortic aneurysm of unspecified site, without rupture: Secondary | ICD-10-CM | POA: Insufficient documentation

## 2021-02-02 DIAGNOSIS — I2510 Atherosclerotic heart disease of native coronary artery without angina pectoris: Secondary | ICD-10-CM | POA: Insufficient documentation

## 2021-02-02 DIAGNOSIS — R55 Syncope and collapse: Secondary | ICD-10-CM | POA: Insufficient documentation

## 2021-02-02 DIAGNOSIS — I219 Acute myocardial infarction, unspecified: Secondary | ICD-10-CM | POA: Insufficient documentation

## 2021-02-02 DIAGNOSIS — C801 Malignant (primary) neoplasm, unspecified: Secondary | ICD-10-CM | POA: Insufficient documentation

## 2021-02-02 DIAGNOSIS — F0390 Unspecified dementia without behavioral disturbance: Secondary | ICD-10-CM | POA: Insufficient documentation

## 2021-02-04 ENCOUNTER — Ambulatory Visit: Payer: Medicare Other | Admitting: Cardiology

## 2021-02-04 ENCOUNTER — Other Ambulatory Visit: Payer: Self-pay

## 2021-02-04 VITALS — BP 110/60 | HR 68 | Ht 71.0 in | Wt 182.1 lb

## 2021-02-04 DIAGNOSIS — I2510 Atherosclerotic heart disease of native coronary artery without angina pectoris: Secondary | ICD-10-CM

## 2021-02-04 DIAGNOSIS — I5032 Chronic diastolic (congestive) heart failure: Secondary | ICD-10-CM

## 2021-02-04 DIAGNOSIS — I10 Essential (primary) hypertension: Secondary | ICD-10-CM | POA: Diagnosis not present

## 2021-02-04 DIAGNOSIS — I714 Abdominal aortic aneurysm, without rupture, unspecified: Secondary | ICD-10-CM

## 2021-02-04 NOTE — Patient Instructions (Signed)

## 2021-02-04 NOTE — Progress Notes (Signed)
Cardiology Office Note:    Date:  02/04/2021   ID:  Ian Harrison, DOB Sep 10, 1927, MRN [id number]  PCP:  Mackie Pai, PA-C  Cardiologist:  Berniece Salines, DO  Electrophysiologist:  None   Referring MD: Elise Benne   I am doing well  History of Present Illness:    Ian Harrison is a 87 y.o. male with a hx of CAD With prior MI requiring RCA stent on 2005, NSTEMI in 02/2015 with another stent placement to his RCA, residual 50% Lcx disease, Aortic Anuerysm status post repair in 2007, Dyslipidemia and was taken off atorvastatin due to myalgias., Type II DM, CKD and Dementia.  I recently saw the patient in July 2021 at that time he appears to be doing well from a cardiovascular standpoint.  However in the interim he was hospitalized and underwent a heart catheterization which showed mild to moderate CAD with minimal RCA stent restenosis.  Past Medical History:  Diagnosis Date  . Aortic aneurysm ([place])   . CAD (coronary artery disease)   . Cancer [facility])    Skin cancer  . Chronic kidney disease   . Dementia ([place])   . Diabetes mellitus type II, non insulin dependent ([place])   . Dyslipidemia    Intolerance to atorvastatin due to myalgias  . Myocardial infarction ([place])    15 years ago  . Vasovagal syncope    Without recurrence    Past Surgical History:  Procedure Laterality Date  . ABDOMINAL AORTIC ANEURYSM REPAIR    . APPENDECTOMY    . CATARACT EXTRACTION W/ INTRAOCULAR LENS IMPLANT    . CYSTOSCOPY WITH FULGERATION N/A 06/23/2020   Procedure: CYSTOSCOPY WITH CLOT EVACUATION;  Surgeon: Robley Fries, MD;  Location: [facility];  Service: Urology;  Laterality: N/A;  . HERNIA REPAIR    . LEFT HEART CATH AND CORONARY ANGIOGRAPHY N/A 07/14/2020   Procedure: LEFT HEART CATH AND CORONARY ANGIOGRAPHY;  Surgeon: Burnell Blanks, MD;  Location: [facility];  Service: Cardiovascular;  Laterality: N/A;  . TRANSURETHRAL RESECTION OF BLADDER TUMOR N/A 06/23/2020    Procedure: TRANSURETHRAL RESECTION OF BLADDER TUMOR (TURBT);  Surgeon: Robley Fries, MD;  Location: [facility];  Service: Urology;  Laterality: N/A;  90 MINS  . WISDOM TOOTH EXTRACTION      Current Medications: Current Meds  Medication Sig  . aspirin EC 81 MG tablet Take 81 mg by mouth daily with breakfast.   . clopidogrel (PLAVIX) 75 MG tablet TAKE 1 TABLET BY MOUTH ONCE DAILY  . metoprolol succinate (TOPROL-XL) 25 MG 24 hr tablet TAKE 1 TABLET (25 MG TOTAL) BY MOUTH DAILY.  . nitroGLYCERIN (NITROSTAT) 0.4 MG SL tablet Place 1 tablet (0.4 mg total) under the tongue every 5 (five) minutes as needed for chest pain.  . tamsulosin (FLOMAX) 0.4 MG CAPS capsule Take 0.4 mg by mouth at bedtime.     Allergies:   Atorvastatin and Morphine and related   Social History   Socioeconomic History  . Marital status: Married    Spouse name: Not on file  . Number of children: Not on file  . Years of education: Not on file  . Highest education level: Not on file  Occupational History  . Not on file  Tobacco Use  . Smoking status: Former Research scientist (life sciences)  . Smokeless tobacco: Never Used  . Tobacco comment: Quit 17 years ago  Vaping Use  . Vaping Use: Never used  Substance and Sexual Activity  . Alcohol use: Not Currently  .  Drug use: Never  . Sexual activity: Not on file  Other Topics Concern  . Not on file  Social History Narrative  . Not on file   Social Determinants of Health   Financial Resource Strain: Not on file  Food Insecurity: Not on file  Transportation Needs: Not on file  Physical Activity: Not on file  Stress: Not on file  Social Connections: Not on file     Family History: The patient's family history includes Heart attack in his brother.  ROS:   Review of Systems  Constitution: Negative for decreased appetite, fever and weight gain.  HENT: Negative for congestion, ear discharge, hoarse voice and sore throat.   Eyes: Negative for discharge, redness, vision loss in right  eye and visual halos.  Cardiovascular: Negative for chest pain, dyspnea on exertion, leg swelling, orthopnea and palpitations.  Respiratory: Negative for cough, hemoptysis, shortness of breath and snoring.   Endocrine: Negative for heat intolerance and polyphagia.  Hematologic/Lymphatic: Negative for bleeding problem. Does not bruise/bleed easily.  Skin: Negative for flushing, nail changes, rash and suspicious lesions.  Musculoskeletal: Negative for arthritis, joint pain, muscle cramps, myalgias, neck pain and stiffness.  Gastrointestinal: Negative for abdominal pain, bowel incontinence, diarrhea and excessive appetite.  Genitourinary: Negative for decreased libido, genital sores and incomplete emptying.  Neurological: Negative for brief paralysis, focal weakness, headaches and loss of balance.  Psychiatric/Behavioral: Negative for altered mental status, depression and suicidal ideas.  Allergic/Immunologic: Negative for HIV exposure and persistent infections.    EKGs/Labs/Other Studies Reviewed:    The following studies were reviewed today:   EKG: None today  Recent Labs: 07/10/2020: B Natriuretic Peptide 616.4 07/13/2020: Magnesium 2.2 07/28/2020: ALT 12; BUN 20; Creat 1.04; Hemoglobin 12.0; Platelets 254; Potassium 4.9; Sodium 139  Recent Lipid Panel No results found for: CHOL, TRIG, HDL, CHOLHDL, VLDL, LDLCALC, LDLDIRECT  Physical Exam:    VS:  BP 110/60   Pulse 68   Ht 5\' 11"  (1.803 m)   Wt 182 lb 1.6 oz (82.6 kg)   SpO2 94%   BMI 25.40 kg/m     Wt Readings from Last 3 Encounters:  02/04/21 182 lb 1.6 oz (82.6 kg)  10/03/20 184 lb (83.5 kg)  08/06/20 177 lb (80.3 kg)     GEN: Well nourished, well developed in no acute distress HEENT: Normal NECK: No JVD; No carotid bruits LYMPHATICS: No lymphadenopathy CARDIAC: S1S2 noted,RRR, no murmurs, rubs, gallops RESPIRATORY:  Clear to auscultation without rales, wheezing or rhonchi  ABDOMEN: Soft, non-tender, non-distended,  +bowel sounds, no guarding. EXTREMITIES: No edema, No cyanosis, no clubbing MUSCULOSKELETAL:  No deformity  SKIN: Warm and dry NEUROLOGIC:  Alert and oriented x 3, non-focal PSYCHIATRIC:  Normal affect, good insight  ASSESSMENT:    1. Coronary artery disease involving native coronary artery of native heart without angina pectoris   2. Hypertension, unspecified type   3. Chronic diastolic CHF (congestive heart failure) ([place])   4. AAA (abdominal aortic aneurysm) without rupture (HCC)    PLAN:    He appears to be doing well from a cardiovascular standpoint.  We will continue the patient's current medication regimen.  We will continue his.  He is looking forward to his [age] next month.  He tells me that he do some walking mostly 2-3 times a week.  He is here with his wife today.  The patient is in agreement with the above plan. The patient left the office in stable condition.  The patient will follow up  in 1 year or sooner if needed.   Medication Adjustments/Labs and Tests Ordered: Current medicines are reviewed at length with the patient today.  Concerns regarding medicines are outlined above.  No orders of the defined types were placed in this encounter.  No orders of the defined types were placed in this encounter.   Patient Instructions  Medication Instructions:  Your physician recommends that you continue on your current medications as directed. Please refer to the Current Medication list given to you today.  *If you need a refill on your cardiac medications before your next appointment, please call your pharmacy*   Lab Work: None If you have labs (blood work) drawn today and your tests are completely normal, you will receive your results only by: Marland Kitchen MyChart Message (if you have MyChart) OR . A paper copy in the mail If you have any lab test that is abnormal or we need to change your treatment, we will call you to review the  results.   Testing/Procedures: None   Follow-Up: At [facility], you and your health needs are our priority.  As part of our continuing mission to provide you with exceptional heart care, we have created designated Provider Care Teams.  These Care Teams include your primary Cardiologist (physician) and Advanced Practice Providers (APPs -  Physician Assistants and Nurse Practitioners) who all work together to provide you with the care you need, when you need it.  We recommend signing up for the patient portal called "MyChart".  Sign up information is provided on this After Visit Summary.  MyChart is used to connect with patients for Virtual Visits (Telemedicine).  Patients are able to view lab/test results, encounter notes, upcoming appointments, etc.  Non-urgent messages can be sent to your provider as well.   To learn more about what you can do with MyChart, go to [email or web address].    Your next appointment:   1 year(s)  The format for your next appointment:   In Person  Provider:   Berniece Salines, DO   Other Instructions      Adopting a Healthy Lifestyle.  Know what a healthy weight is for you (roughly BMI <25) and aim to maintain this   Aim for 7+ servings of fruits and vegetables daily   65-80+ fluid ounces of water or unsweet tea for healthy kidneys   Limit to max 1 drink of alcohol per day; avoid smoking/tobacco   Limit animal fats in diet for cholesterol and heart health - choose grass fed whenever available   Avoid highly processed foods, and foods high in saturated/trans fats   Aim for low stress - take time to unwind and care for your mental health   Aim for 150 min of moderate intensity exercise weekly for heart health, and weights twice weekly for bone health   Aim for 7-9 hours of sleep daily   When it comes to diets, agreement about the perfect plan isnt easy to find, even among the experts. Experts at the [employer] developed  an idea known as the Healthy Eating Plate. Just imagine a plate divided into logical, healthy portions.   The emphasis is on diet quality:   Load up on vegetables and fruits - one-half of your plate: Aim for color and variety, and remember that potatoes dont count.   Go for whole grains - one-quarter of your plate: Whole wheat, barley, wheat berries, quinoa, oats, brown rice, and foods made with them. If you want pasta, go  with whole wheat pasta.   Protein power - one-quarter of your plate: Fish, chicken, beans, and nuts are all healthy, versatile protein sources. Limit red meat.   The diet, however, does go beyond the plate, offering a few other suggestions.   Use healthy plant oils, such as olive, canola, soy, corn, sunflower and peanut. Check the labels, and avoid partially hydrogenated oil, which have unhealthy trans fats.   If youre thirsty, drink water. Coffee and tea are good in moderation, but skip sugary drinks and limit milk and dairy products to one or two daily servings.   The type of carbohydrate in the diet is more important than the amount. Some sources of carbohydrates, such as vegetables, fruits, whole grains, and beans-are healthier than others.   Finally, stay active  Signed, Berniece Salines, DO  02/04/2021 2:04 PM    [place]

## 2021-03-05 ENCOUNTER — Other Ambulatory Visit: Payer: Self-pay

## 2021-03-05 ENCOUNTER — Other Ambulatory Visit (HOSPITAL_BASED_OUTPATIENT_CLINIC_OR_DEPARTMENT_OTHER): Payer: Self-pay

## 2021-03-06 ENCOUNTER — Other Ambulatory Visit (HOSPITAL_BASED_OUTPATIENT_CLINIC_OR_DEPARTMENT_OTHER): Payer: Self-pay

## 2021-03-06 MED ORDER — TAMSULOSIN HCL 0.4 MG PO CAPS
ORAL_CAPSULE | ORAL | 0 refills | Status: DC
  Filled 2021-03-06: qty 60, 30d supply, fill #0

## 2021-03-25 ENCOUNTER — Other Ambulatory Visit (HOSPITAL_BASED_OUTPATIENT_CLINIC_OR_DEPARTMENT_OTHER): Payer: Self-pay

## 2021-03-25 ENCOUNTER — Other Ambulatory Visit: Payer: Self-pay | Admitting: Cardiology

## 2021-03-25 MED FILL — Metoprolol Succinate Tab ER 24HR 25 MG (Tartrate Equiv): ORAL | 90 days supply | Qty: 90 | Fill #0 | Status: AC

## 2021-03-25 NOTE — Telephone Encounter (Signed)
Refill sent to pharmacy.   

## 2021-03-26 ENCOUNTER — Other Ambulatory Visit (HOSPITAL_BASED_OUTPATIENT_CLINIC_OR_DEPARTMENT_OTHER): Payer: Self-pay

## 2021-04-03 ENCOUNTER — Other Ambulatory Visit: Payer: Self-pay

## 2021-04-03 ENCOUNTER — Ambulatory Visit (INDEPENDENT_AMBULATORY_CARE_PROVIDER_SITE_OTHER): Payer: Medicare Other | Admitting: Medical

## 2021-04-03 VITALS — BP 120/60 | HR 69 | Resp 18 | Ht 71.0 in | Wt 191.0 lb

## 2021-04-03 DIAGNOSIS — I2510 Atherosclerotic heart disease of native coronary artery without angina pectoris: Secondary | ICD-10-CM

## 2021-04-03 DIAGNOSIS — E785 Hyperlipidemia, unspecified: Secondary | ICD-10-CM | POA: Diagnosis not present

## 2021-04-03 DIAGNOSIS — R739 Hyperglycemia, unspecified: Secondary | ICD-10-CM | POA: Diagnosis not present

## 2021-04-03 DIAGNOSIS — I10 Essential (primary) hypertension: Secondary | ICD-10-CM | POA: Diagnosis not present

## 2021-04-03 DIAGNOSIS — N401 Enlarged prostate with lower urinary tract symptoms: Secondary | ICD-10-CM

## 2021-04-03 NOTE — Patient Instructions (Addendum)
Your blood pressure is well controlled. Continue metoprolol.  For elevated sugar will get future a1c and cmp.  For hx of CAD placed future lipid panel.  Consider getting covid booster late summer/early fall. Hx of 3 covid vaccines.   Hx bph. Currently urinating well. Saw urologist about 2 weeks ago. US done and UA was normal.  Follow up 3-6 month or as needed

## 2021-04-03 NOTE — Progress Notes (Signed)
Subjective:    Patient ID: Ian Harrison, male    DOB: 20-Feb-1927, 87 y.o.   MRN: [id number]  HPI  Pt has hx of CAD, elevated sugar and elevated blood pressure.    He appears to be doing well from a cardiovascular standpoint.  Note complaints.  His blood pressure deceptively in the office today.  We will continue patient's current medication regimen.   Diabetes mellitus per his PCP.    Oct saw cardiologist. "CAD-no anginal symptoms continue patient's current medication regimen.  He has significant statin allergies and prefers to be off statin continue with antiplatelet therapy.   No signs of fluid overload.   The patient is in agreement with the above plan. The patient left the office in stable condition.  The patient will follow up in"    Review of Systems  Constitutional:  Negative for chills, fatigue and fever.  HENT:  Negative for congestion and ear discharge.   Respiratory:  Negative for cough, chest tightness and wheezing.   Cardiovascular:  Negative for chest pain and palpitations.  Gastrointestinal:  Negative for abdominal pain.  Genitourinary:  Negative for dysuria, flank pain and frequency.  Musculoskeletal:  Negative for back pain and joint swelling.  Neurological:  Negative for dizziness, syncope and light-headedness.  Hematological:  Negative for adenopathy. Does not bruise/bleed easily.  Psychiatric/Behavioral:  Negative for agitation, confusion and self-injury.     Past Medical History:  Diagnosis Date   Aortic aneurysm (HCC)    CAD (coronary artery disease)    Cancer (HCC)    Skin cancer   Chronic kidney disease    Dementia (HCC)    Diabetes mellitus type II, non insulin dependent (HCC)    Dyslipidemia    Intolerance to atorvastatin due to myalgias   Myocardial infarction ([place])    15 years ago   Vasovagal syncope    Without recurrence     Social History   Socioeconomic History   Marital status: Married    Spouse name: Not on file    Number of children: Not on file   Years of education: Not on file   Highest education level: Not on file  Occupational History   Not on file  Tobacco Use   Smoking status: Former    Pack years: 0.00   Smokeless tobacco: Never   Tobacco comments:    Quit 17 years ago  Vaping Use   Vaping Use: Never used  Substance and Sexual Activity   Alcohol use: Not Currently   Drug use: Never   Sexual activity: Not on file  Other Topics Concern   Not on file  Social History Narrative   Not on file   Social Determinants of Health   Financial Resource Strain: Not on file  Food Insecurity: Not on file  Transportation Needs: Not on file  Physical Activity: Not on file  Stress: Not on file  Social Connections: Not on file  Intimate Partner Violence: Not on file    Past Surgical History:  Procedure Laterality Date   ABDOMINAL AORTIC ANEURYSM REPAIR     APPENDECTOMY     CATARACT EXTRACTION W/ [place] N/A 06/23/2020   Procedure: CYSTOSCOPY WITH CLOT EVACUATION;  Surgeon: Robley Fries, MD;  [facility];  Service: Urology;  Laterality: N/A;   HERNIA REPAIR     LEFT HEART CATH AND CORONARY ANGIOGRAPHY N/A 07/14/2020   Procedure: LEFT HEART CATH AND CORONARY ANGIOGRAPHY;  Surgeon:  Burnell Blanks, MD;  Location: [place] [facility];  Service: Cardiovascular;  Laterality: N/A;   TRANSURETHRAL RESECTION OF BLADDER TUMOR N/A 06/23/2020   Procedure: TRANSURETHRAL RESECTION OF BLADDER TUMOR (TURBT);  Surgeon: Robley Fries, MD;  [facility];  Service: Urology;  Laterality: N/A;  63 MINS   WISDOM TOOTH EXTRACTION      Family History  Problem Relation Age of Onset   Heart attack Brother     Allergies  Allergen Reactions   Atorvastatin Other (See Comments)    myalgia   Morphine And Related Other (See Comments)    Mood changes (anxious)    Current Outpatient Medications on File Prior to Visit  Medication Sig Dispense  Refill   aspirin EC 81 MG tablet Take 81 mg by mouth daily with breakfast.      clopidogrel (PLAVIX) 75 MG tablet TAKE 1 TABLET BY MOUTH ONCE DAILY 90 tablet 1   metoprolol succinate (TOPROL-XL) 25 MG 24 hr tablet TAKE 1 TABLET (25 MG TOTAL) BY MOUTH DAILY. 90 tablet 1   nitroGLYCERIN (NITROSTAT) 0.4 MG SL tablet Place 1 tablet (0.4 mg total) under the tongue every 5 (five) minutes as needed for chest pain. 30 tablet 12   tamsulosin (FLOMAX) 0.4 MG CAPS capsule Take 0.4 mg by mouth at bedtime.     tamsulosin (FLOMAX) 0.4 MG CAPS capsule Take 1 capsule by mouth 2 times daily. **Needs appointment for further refills** 60 capsule 0   No current facility-administered medications on file prior to visit.    BP 120/60   Pulse 69   Resp 18   Ht 5\' 11"  (1.803 m)   Wt 191 lb (86.6 kg)   SpO2 95%   BMI 26.64 kg/m        Objective:   Physical Exam  General Mental Status- Alert. General Appearance- Not in acute distress.   Skin General: Color- Normal Color. Moisture- Normal Moisture.  Neck Carotid Arteries- Normal color. Moisture- Normal Moisture. No carotid bruits. No JVD.  Chest and Lung Exam Auscultation: Breath Sounds:-Normal.  Cardiovascular Auscultation:Rythm- Regular. Murmurs & Other Heart Sounds:Auscultation of the heart reveals- No Murmurs.  Abdomen Inspection:-Inspeection Normal. Palpation/Percussion:Note:No mass. Palpation and Percussion of the abdomen reveal- Non Tender, Non Distended + BS, no rebound or guarding.   Neurologic Cranial Nerve exam:- CN III-XII intact(No nystagmus), symmetric smile. Strength:- 5/5 equal and symmetric strength both upper and lower extremities.       Assessment & Plan:   Your blood pressure is well controlled. Continue metoprolol.  For elevated sugar will get future a1c and cmp.  For hx of CAD placed future lipid panel.  Consider getting covid booster late summer/early fall. Hx of 3 covid vaccines.  x bph. Currently urinating  well. Saw urologist about 2 weeks ago. US done and UA was normal.   Follow up 3-6 month or as needed  [employer], PA-C

## 2021-04-09 ENCOUNTER — Other Ambulatory Visit: Payer: Self-pay

## 2021-04-09 ENCOUNTER — Other Ambulatory Visit (INDEPENDENT_AMBULATORY_CARE_PROVIDER_SITE_OTHER): Payer: Medicare Other

## 2021-04-09 DIAGNOSIS — R739 Hyperglycemia, unspecified: Secondary | ICD-10-CM

## 2021-04-09 DIAGNOSIS — I10 Essential (primary) hypertension: Secondary | ICD-10-CM | POA: Diagnosis not present

## 2021-04-09 DIAGNOSIS — E785 Hyperlipidemia, unspecified: Secondary | ICD-10-CM

## 2021-04-09 DIAGNOSIS — I2510 Atherosclerotic heart disease of native coronary artery without angina pectoris: Secondary | ICD-10-CM

## 2021-04-09 LAB — COMPREHENSIVE METABOLIC PANEL
ALT: 14 U/L (ref 0–53)
AST: 13 U/L (ref 0–37)
Albumin: 4.3 g/dL (ref 3.5–5.2)
Alkaline Phosphatase: 41 U/L (ref 39–117)
BUN: 19 mg/dL (ref 6–23)
CO2: 28 mEq/L (ref 19–32)
Calcium: 9.1 mg/dL (ref 8.4–10.5)
Chloride: 105 mEq/L (ref 96–112)
Creatinine, Ser: 0.98 mg/dL (ref 0.40–1.50)
GFR: 67.14 mL/min (ref >60.00)
Glucose, Bld: 98 mg/dL (ref 70–99)
Potassium: 4.3 mEq/L (ref 3.5–5.1)
Sodium: 140 mEq/L (ref 135–145)
Total Bilirubin: 1.3 mg/dL — ABNORMAL HIGH (ref 0.2–1.2)
Total Protein: 6.6 g/dL (ref 6.0–8.3)

## 2021-04-09 LAB — LIPID PANEL
Cholesterol: 139 mg/dL (ref 0–200)
HDL: 55.7 mg/dL
LDL Cholesterol: 72 mg/dL (ref 0–99)
NonHDL: 83.73
Total CHOL/HDL Ratio: 3
Triglycerides: 59 mg/dL (ref 0.0–149.0)
VLDL: 11.8 mg/dL (ref 0.0–40.0)

## 2021-04-09 LAB — HEMOGLOBIN A1C: Hgb A1c MFr Bld: 6.1 % (ref 4.6–6.5)

## 2021-04-20 ENCOUNTER — Other Ambulatory Visit: Payer: Self-pay | Admitting: Cardiology

## 2021-04-20 ENCOUNTER — Other Ambulatory Visit (HOSPITAL_BASED_OUTPATIENT_CLINIC_OR_DEPARTMENT_OTHER): Payer: Self-pay

## 2021-04-20 MED ORDER — CLOPIDOGREL BISULFATE 75 MG PO TABS
ORAL_TABLET | Freq: Every day | ORAL | 1 refills | Status: DC
  Filled 2021-04-20: qty 90, 90d supply, fill #0
  Filled 2021-07-27: qty 90, 90d supply, fill #1

## 2021-04-20 NOTE — Telephone Encounter (Signed)
Rx approved and sent 

## 2021-04-21 ENCOUNTER — Other Ambulatory Visit (HOSPITAL_BASED_OUTPATIENT_CLINIC_OR_DEPARTMENT_OTHER): Payer: Self-pay

## 2021-04-23 ENCOUNTER — Other Ambulatory Visit (HOSPITAL_BASED_OUTPATIENT_CLINIC_OR_DEPARTMENT_OTHER): Payer: Self-pay

## 2021-05-12 ENCOUNTER — Other Ambulatory Visit (HOSPITAL_BASED_OUTPATIENT_CLINIC_OR_DEPARTMENT_OTHER): Payer: Self-pay

## 2021-05-12 MED ORDER — TAMSULOSIN HCL 0.4 MG PO CAPS
ORAL_CAPSULE | ORAL | 0 refills | Status: DC
  Filled 2021-05-12: qty 60, 30d supply, fill #0

## 2021-06-10 ENCOUNTER — Other Ambulatory Visit (HOSPITAL_BASED_OUTPATIENT_CLINIC_OR_DEPARTMENT_OTHER): Payer: Self-pay

## 2021-06-10 MED ORDER — AMOXICILLIN 500 MG PO CAPS
ORAL_CAPSULE | ORAL | 0 refills | Status: DC
  Filled 2021-06-10: qty 30, 10d supply, fill #0

## 2021-06-23 ENCOUNTER — Other Ambulatory Visit (HOSPITAL_BASED_OUTPATIENT_CLINIC_OR_DEPARTMENT_OTHER): Payer: Self-pay

## 2021-06-23 MED FILL — Metoprolol Succinate Tab ER 24HR 25 MG (Tartrate Equiv): ORAL | 90 days supply | Qty: 90 | Fill #1 | Status: AC

## 2021-07-14 ENCOUNTER — Ambulatory Visit (INDEPENDENT_AMBULATORY_CARE_PROVIDER_SITE_OTHER): Payer: Medicare Other

## 2021-07-14 VITALS — Ht 71.0 in | Wt 179.0 lb

## 2021-07-14 DIAGNOSIS — Z0000 Encounter for general adult medical examination without abnormal findings: Secondary | ICD-10-CM | POA: Diagnosis not present

## 2021-07-14 NOTE — Patient Instructions (Signed)
Ian Harrison , Thank you for taking time to complete your Medicare Wellness Visit. I appreciate your ongoing commitment to your health goals. Please review the following plan we discussed and let me know if I can assist you in the future.   Screening recommendations/referrals: Colonoscopy: No longer required Recommended yearly ophthalmology/optometry visit for glaucoma screening and checkup Recommended yearly dental visit for hygiene and checkup  Vaccinations: Influenza vaccine: Due-May obtain vaccine at our office or your local pharmacy. Pneumococcal vaccine: Up to date Tdap vaccine: Up to date-Specific date unknown Shingles vaccine: Discuss with pharmacy   Covid-19: Booster available at the pharmacy  Advanced directives: Please bring a copy for your chart  Conditions/risks identified: See problem list  Next appointment: Follow up in one year for your annual wellness visit.   Preventive Care 85 Years and Older, Male Preventive care refers to lifestyle choices and visits with your health care provider that can promote health and wellness. What does preventive care include? A yearly physical exam. This is also called an annual well check. Dental exams once or twice a year. Routine eye exams. Ask your health care provider how often you should have your eyes checked. Personal lifestyle choices, including: Daily care of your teeth and gums. Regular physical activity. Eating a healthy diet. Avoiding tobacco and drug use. Limiting alcohol use. Practicing safe sex. Taking low doses of aspirin every day. Taking vitamin and mineral supplements as recommended by your health care provider. What happens during an annual well check? The services and screenings done by your health care provider during your annual well check will depend on your age, overall health, lifestyle risk factors, and family history of disease. Counseling  Your health care provider may ask you questions about  your: Alcohol use. Tobacco use. Drug use. Emotional well-being. Home and relationship well-being. Sexual activity. Eating habits. History of falls. Memory and ability to understand (cognition). Work and work Statistician. Screening  You may have the following tests or measurements: Height, weight, and BMI. Blood pressure. Lipid and cholesterol levels. These may be checked every 5 years, or more frequently if you are over 32 years old. Skin check. Lung cancer screening. You may have this screening every year starting at age 69 if you have a 30-pack-year history of smoking and currently smoke or have quit within the past 15 years. Fecal occult blood test (FOBT) of the stool. You may have this test every year starting at age 44. Flexible sigmoidoscopy or colonoscopy. You may have a sigmoidoscopy every 5 years or a colonoscopy every 10 years starting at age 61. Prostate cancer screening. Recommendations will vary depending on your family history and other risks. Hepatitis C blood test. Hepatitis B blood test. Sexually transmitted disease (STD) testing. Diabetes screening. This is done by checking your blood sugar (glucose) after you have not eaten for a while (fasting). You may have this done every 1-3 years. Abdominal aortic aneurysm (AAA) screening. You may need this if you are a current or former smoker. Osteoporosis. You may be screened starting at age 83 if you are at high risk. Talk with your health care provider about your test results, treatment options, and if necessary, the need for more tests. Vaccines  Your health care provider may recommend certain vaccines, such as: Influenza vaccine. This is recommended every year. Tetanus, diphtheria, and acellular pertussis (Tdap, Td) vaccine. You may need a Td booster every 10 years. Zoster vaccine. You may need this after age 10. Pneumococcal 13-valent conjugate (PCV13) vaccine.  One dose is recommended after age 10. Pneumococcal  polysaccharide (PPSV23) vaccine. One dose is recommended after age 59. Talk to your health care provider about which screenings and vaccines you need and how often you need them. This information is not intended to replace advice given to you by your health care provider. Make sure you discuss any questions you have with your health care provider. Document Released: 11/07/2015 Document Revised: 06/30/2016 Document Reviewed: 08/12/2015 Elsevier Interactive Patient Education  2017 [employer] Prevention in the Home Falls can cause injuries. They can happen to people of all ages. There are many things you can do to make your home safe and to help prevent falls. What can I do on the outside of my home? Regularly fix the edges of walkways and driveways and fix any cracks. Remove anything that might make you trip as you walk through a door, such as a raised step or threshold. Trim any bushes or trees on the path to your home. Use bright outdoor lighting. Clear any walking paths of anything that might make someone trip, such as rocks or tools. Regularly check to see if handrails are loose or broken. Make sure that both sides of any steps have handrails. Any raised decks and porches should have guardrails on the edges. Have any leaves, snow, or ice cleared regularly. Use sand or salt on walking paths during winter. Clean up any spills in your garage right away. This includes oil or grease spills. What can I do in the bathroom? Use night lights. Install grab bars by the toilet and in the tub and shower. Do not use towel bars as grab bars. Use non-skid mats or decals in the tub or shower. If you need to sit down in the shower, use a plastic, non-slip stool. Keep the floor dry. Clean up any water that spills on the floor as soon as it happens. Remove soap buildup in the tub or shower regularly. Attach bath mats securely with double-sided non-slip rug tape. Do not have throw rugs and other  things on the floor that can make you trip. What can I do in the bedroom? Use night lights. Make sure that you have a light by your bed that is easy to reach. Do not use any sheets or blankets that are too big for your bed. They should not hang down onto the floor. Have a firm chair that has side arms. You can use this for support while you get dressed. Do not have throw rugs and other things on the floor that can make you trip. What can I do in the kitchen? Clean up any spills right away. Avoid walking on wet floors. Keep items that you use a lot in easy-to-reach places. If you need to reach something above you, use a strong step stool that has a grab bar. Keep electrical cords out of the way. Do not use floor polish or wax that makes floors slippery. If you must use wax, use non-skid floor wax. Do not have throw rugs and other things on the floor that can make you trip. What can I do with my stairs? Do not leave any items on the stairs. Make sure that there are handrails on both sides of the stairs and use them. Fix handrails that are broken or loose. Make sure that handrails are as long as the stairways. Check any carpeting to make sure that it is firmly attached to the stairs. Fix any carpet that is loose or  worn. Avoid having throw rugs at the top or bottom of the stairs. If you do have throw rugs, attach them to the floor with carpet tape. Make sure that you have a light switch at the top of the stairs and the bottom of the stairs. If you do not have them, ask someone to add them for you. What else can I do to help prevent falls? Wear shoes that: Do not have high heels. Have rubber bottoms. Are comfortable and fit you well. Are closed at the toe. Do not wear sandals. If you use a stepladder: Make sure that it is fully opened. Do not climb a closed stepladder. Make sure that both sides of the stepladder are locked into place. Ask someone to hold it for you, if possible. Clearly  mark and make sure that you can see: Any grab bars or handrails. First and last steps. Where the edge of each step is. Use tools that help you move around (mobility aids) if they are needed. These include: Canes. Walkers. Scooters. Crutches. Turn on the lights when you go into a dark area. Replace any light bulbs as soon as they burn out. Set up your furniture so you have a clear path. Avoid moving your furniture around. If any of your floors are uneven, fix them. If there are any pets around you, be aware of where they are. Review your medicines with your doctor. Some medicines can make you feel dizzy. This can increase your chance of falling. Ask your doctor what other things that you can do to help prevent falls. This information is not intended to replace advice given to you by your health care provider. Make sure you discuss any questions you have with your health care provider. Document Released: 08/07/2009 Document Revised: 03/18/2016 Document Reviewed: 11/15/2014 Elsevier Interactive Patient Education  2017 [employer].

## 2021-07-14 NOTE — Progress Notes (Addendum)
Subjective:   Ian Harrison is a 87 y.o. male who presents for an Initial Medicare Annual Wellness Visit.  I connected with Tynell today by telephone and verified that I am speaking with the correct person using two identifiers. Location patient: home Location provider: work Persons participating in the virtual visit: patient, wife, Marine scientist.    I discussed the limitations, risks, security and privacy concerns of performing an evaluation and management service by telephone and the availability of in person appointments. I also discussed with the patient that there may be a patient responsible charge related to this service. The patient expressed understanding and verbally consented to this telephonic visit.    Interactive audio and video telecommunications were attempted between this provider and patient, however failed, due to patient having technical difficulties OR patient did not have access to video capability.  We continued and completed visit with audio only.  Some vital signs may be absent or patient reported.   Time Spent with patient on telephone encounter: 25 minutes   Review of Systems     Cardiac Risk Factors include: advanced age (>65men, >60 women);male gender;diabetes mellitus;dyslipidemia;hypertension     Objective:    Today's Vitals   07/14/21 1019  Weight: 179 lb (81.2 kg)  Height: 5\' 11"  (1.803 m)   Body mass index is 24.97 kg/m.  Advanced Directives 07/14/2021 07/19/2020 07/10/2020 06/18/2020 06/13/2020 06/09/2020  Does Patient Have a Medical Advance Directive? Yes No Yes Yes No;Yes Yes  Type of Paramedic of [place];Living will Healthcare Power of [place] of [place];Living will - -  Does patient want to make changes to medical advance directive? - No - Patient declined No - Patient declined - No - Patient declined -  Copy of [place] in Chart? No - copy requested No -  copy requested No - copy requested No - copy requested - -  Would patient like information on creating a medical advance directive? - No - Patient declined - - - -    Current Medications (verified) Outpatient Encounter Medications as of 07/14/2021  Medication Sig   aspirin EC 81 MG tablet Take 81 mg by mouth daily with breakfast.    clopidogrel (PLAVIX) 75 MG tablet TAKE 1 TABLET BY MOUTH ONCE DAILY   metoprolol succinate (TOPROL-XL) 25 MG 24 hr tablet TAKE 1 TABLET (25 MG TOTAL) BY MOUTH DAILY.   nitroGLYCERIN (NITROSTAT) 0.4 MG SL tablet Place 1 tablet (0.4 mg total) under the tongue every 5 (five) minutes as needed for chest pain.   tamsulosin (FLOMAX) 0.4 MG CAPS capsule Take 0.4 mg by mouth at bedtime.   tamsulosin (FLOMAX) 0.4 MG CAPS capsule Take 1 capsule by mouth 2 times daily. **Needs follow-up visit for further refills**   [DISCONTINUED] amoxicillin (AMOXIL) 500 MG capsule Take 1 capsule by mouth every 8 hours until completed   No facility-administered encounter medications on file as of 07/14/2021.    Allergies (verified) Atorvastatin and Morphine and related   History: Past Medical History:  Diagnosis Date   Aortic aneurysm (HCC)    CAD (coronary artery disease)    Cancer (HCC)    Skin cancer   Chronic kidney disease    Dementia (HCC)    Diabetes mellitus type II, non insulin dependent (HCC)    Dyslipidemia    Intolerance to atorvastatin due to myalgias   Myocardial infarction ([place])    15 years ago   Vasovagal syncope  Without recurrence   Past Surgical History:  Procedure Laterality Date   ABDOMINAL AORTIC ANEURYSM REPAIR     APPENDECTOMY     CATARACT EXTRACTION W/ INTRAOCULAR LENS IMPLANT     CYSTOSCOPY WITH FULGERATION N/A 06/23/2020   Procedure: CYSTOSCOPY WITH CLOT EVACUATION;  Surgeon: Robley Fries, MD;  Location: [facility];  Service: Urology;  Laterality: N/A;   HERNIA REPAIR     LEFT HEART CATH AND CORONARY ANGIOGRAPHY N/A 07/14/2020   Procedure:  LEFT HEART CATH AND CORONARY ANGIOGRAPHY;  Surgeon: Burnell Blanks, MD;  Location: [facility];  Service: Cardiovascular;  Laterality: N/A;   TRANSURETHRAL RESECTION OF BLADDER TUMOR N/A 06/23/2020   Procedure: TRANSURETHRAL RESECTION OF BLADDER TUMOR (TURBT);  Surgeon: Robley Fries, MD;  Location: [facility];  Service: Urology;  Laterality: N/A;  58 MINS   WISDOM TOOTH EXTRACTION     Family History  Problem Relation Age of Onset   Heart attack Brother    Social History   Socioeconomic History   Marital status: Married    Spouse name: Not on file   Number of children: Not on file   Years of education: Not on file   Highest education level: Not on file  Occupational History   Not on file  Tobacco Use   Smoking status: Former   Smokeless tobacco: Never   Tobacco comments:    Quit 17 years ago  Vaping Use   Vaping Use: Never used  Substance and Sexual Activity   Alcohol use: Not Currently   Drug use: Never   Sexual activity: Not on file  Other Topics Concern   Not on file  Social History Narrative   Not on file   Social Determinants of Health   Financial Resource Strain: Low Risk    Difficulty of Paying Living Expenses: Not hard at all  Food Insecurity: No Food Insecurity   Worried About Charity fundraiser in the Last Year: Never true   [place] in the Last Year: Never true  Transportation Needs: No Transportation Needs   Lack of Transportation (Medical): No   Lack of Transportation (Non-Medical): No  Physical Activity: Inactive   Days of Exercise per Week: 0 days   Minutes of Exercise per Session: 0 min  Stress: No Stress Concern Present   Feeling of Stress : Not at all  Social Connections: Moderately Isolated   Frequency of Communication with Friends and Family: More than three times a week   Frequency of Social Gatherings with Friends and Family: More than three times a week   Attends Religious Services: Never   Marine scientist or  Organizations: No   Attends Music therapist: Never   Marital Status: Married    Tobacco Counseling Counseling given: Not Answered Tobacco comments: Quit 17 years ago   Clinical Intake:  Pre-visit preparation completed: Yes  Pain : No/denies pain     BMI - recorded: 24.97 Nutritional Status: BMI of 19-24  Normal Nutritional Risks: None Diabetes: Yes CBG done?: No Did pt. bring in CBG monitor from home?: No (phone visit)  How often do you need to have someone help you when you read instructions, pamphlets, or other written materials from your doctor or pharmacy?: 1 - Never   Diabetes? No   Interpreter Needed?: No  Information entered by :: Caroleen Hamman LPN   Activities of Daily Living In your present state of health, do you have any difficulty performing  the following activities: 07/14/2021 07/10/2020  Hearing? N Y  Comment - slight hoh  Vision? N N  Difficulty concentrating or making decisions? Y Y  Comment Diagnosis of Dementia short term memory issues  Walking or climbing stairs? N Y  Comment - secondary to weakness  Dressing or bathing? N Y  Doing errands, shopping? Tempie Donning  Preparing Food and eating ? N -  Using the Toilet? N -  In the past six months, have you accidently leaked urine? N -  Do you have problems with loss of bowel control? N -  Managing your Medications? N -  Managing your Finances? Y -  Housekeeping or managing your Housekeeping? N -  Some recent data might be hidden    Patient Care Team: Saguier, Iris Pert as PCP - General (Internal Medicine) Berniece Salines, DO as PCP - Cardiology (Cardiology)  Indicate any recent Medical Services you may have received from other than [facility] providers in the past year (date may be approximate).     Assessment:   This is a routine wellness examination for Bryant.  Hearing/Vision screen Hearing Screening - Comments:: No hearing loss per wife Vision Screening - Comments:: Last eye exam-  12/2020-Dr. Desma Mcgregor  Dietary issues and exercise activities discussed: Current Exercise Habits: The patient does not participate in regular exercise at present, Exercise limited by: None identified   Goals Addressed             This Visit's Progress    Patient Stated       Maintain current health       Depression Screen PHQ 2/9 Scores 07/14/2021  PHQ - 2 Score 0    Fall Risk Fall Risk  07/14/2021  Falls in the past year? 0  Number falls in past yr: 0  Injury with Fall? 0  Follow up Falls prevention discussed    FALL RISK PREVENTION PERTAINING TO THE HOME:  Any stairs in or around the home? No  Home free of loose throw rugs in walkways, pet beds, electrical cords, etc? Yes  Adequate lighting in your home to reduce risk of falls? Yes   ASSISTIVE DEVICES UTILIZED TO PREVENT FALLS:  Life alert? No  Use of a cane, walker or w/c? No  Grab bars in the bathroom? Yes  Shower chair or bench in shower? Yes  Elevated toilet seat or a handicapped toilet? No   TIMED UP AND GO:  Was the test performed? No .phone visit    Cognitive Function:Patient has a diagnosis of Dementia. Wife assisted with questions today.        Immunizations Immunization History  Administered Date(s) Administered   Fluad Quad(high Dose 65+) 10/03/2020   Influenza-Unspecified 07/06/2019   Pneumococcal Conjugate-13 08/04/2015   Pneumococcal Polysaccharide-23 10/25/2006    TDAP status: Due, Education has been provided regarding the importance of this vaccine. Advised may receive this vaccine at local pharmacy or Health Dept. Aware to provide a copy of the vaccination record if obtained from local pharmacy or Health Dept. Verbalized acceptance and understanding.  Flu Vaccine status: Due, Education has been provided regarding the importance of this vaccine. Advised may receive this vaccine at local pharmacy or Health Dept. Aware to provide a copy of the vaccination record if obtained from local  pharmacy or Health Dept. Verbalized acceptance and understanding.  Pneumococcal vaccine status: Up to date  Covid-19 vaccine status: Information provided on how to obtain vaccines. Booster due  Qualifies for Shingles Vaccine? Yes   Zostavax completed  No   Shingrix Completed?: No.    Education has been provided regarding the importance of this vaccine. Patient has been advised to call insurance company to determine out of pocket expense if they have not yet received this vaccine. Advised may also receive vaccine at local pharmacy or Health Dept. Verbalized acceptance and understanding.  Screening Tests Health Maintenance  Topic Date Due   COVID-19 Vaccine (1) Never done   FOOT EXAM  Never done   OPHTHALMOLOGY EXAM  Never done   URINE MICROALBUMIN  Never done   TETANUS/TDAP  Never done   Zoster Vaccines- Shingrix (1 of 2) Never done   INFLUENZA VACCINE  05/25/2021   HEMOGLOBIN A1C  10/09/2021   HPV VACCINES  Aged Out    Health Maintenance  Health Maintenance Due  Topic Date Due   COVID-19 Vaccine (1) Never done   FOOT EXAM  Never done   OPHTHALMOLOGY EXAM  Never done   URINE MICROALBUMIN  Never done   TETANUS/TDAP  Never done   Zoster Vaccines- Shingrix (1 of 2) Never done   INFLUENZA VACCINE  05/25/2021    Colorectal cancer screening: No longer required.   Lung Cancer Screening: (Low Dose CT Chest recommended if Age 53-80 years, 30 pack-year currently smoking OR have quit w/in 15years.) does not qualify.     Additional Screening:  Hepatitis C Screening: does not qualif  Vision Screening: Recommended annual ophthalmology exams for early detection of glaucoma and other disorders of the eye. Is the patient up to date with their annual eye exam?  Yes  Who is the provider or what is the name of the office in which the patient attends annual eye exams? Dr. Desma Mcgregor   Dental Screening: Recommended annual dental exams for proper oral hygiene  Community Resource Referral /  Chronic Care Management: CRR required this visit?  No   CCM required this visit?  No      Plan:     I have personally reviewed and noted the following in the patient's chart:   Medical and social history Use of alcohol, tobacco or illicit drugs  Current medications and supplements including opioid prescriptions. Patient is not currently taking opioid prescriptions. Functional ability and status Nutritional status Physical activity Advanced directives List of other physicians Hospitalizations, surgeries, and ER visits in previous 12 months Vitals Screenings to include cognitive, depression, and falls Referrals and appointments  In addition, I have reviewed and discussed with patient certain preventive protocols, quality metrics, and best practice recommendations. A written personalized care plan for preventive services as well as general preventive health recommendations were provided to patient.   Due to this being a telephonic visit, the after visit summary with patients personalized plan was offered to patient via mail or my-chart.  Per request, patient was mailed a copy of [place], LPN   1/65/5374  Nurse Health Advisor  Nurse Notes: None  I reviewed and agree with assessment and plan of lpn. I was available for consultation by phone or in person if needed.  Mackie Pai, PA-C

## 2021-07-15 ENCOUNTER — Other Ambulatory Visit (HOSPITAL_BASED_OUTPATIENT_CLINIC_OR_DEPARTMENT_OTHER): Payer: Self-pay

## 2021-07-15 MED ORDER — TAMSULOSIN HCL 0.4 MG PO CAPS
ORAL_CAPSULE | ORAL | 3 refills | Status: DC
  Filled 2021-07-15: qty 60, 30d supply, fill #0
  Filled 2021-09-21: qty 60, 30d supply, fill #1
  Filled 2021-12-04: qty 60, 30d supply, fill #2
  Filled 2022-02-08: qty 60, 30d supply, fill #3

## 2021-07-27 ENCOUNTER — Other Ambulatory Visit (HOSPITAL_BASED_OUTPATIENT_CLINIC_OR_DEPARTMENT_OTHER): Payer: Self-pay

## 2021-07-30 ENCOUNTER — Other Ambulatory Visit: Payer: Self-pay | Admitting: Cardiology

## 2021-07-30 ENCOUNTER — Other Ambulatory Visit (HOSPITAL_BASED_OUTPATIENT_CLINIC_OR_DEPARTMENT_OTHER): Payer: Self-pay

## 2021-07-30 ENCOUNTER — Other Ambulatory Visit: Payer: Self-pay | Admitting: Family Medicine

## 2021-07-30 MED ORDER — NITROGLYCERIN 0.4 MG SL SUBL
0.4000 mg | SUBLINGUAL_TABLET | SUBLINGUAL | 3 refills | Status: DC | PRN
  Filled 2021-07-30: qty 25, 8d supply, fill #0
  Filled 2021-09-21: qty 25, 8d supply, fill #1
  Filled 2021-11-06: qty 25, 8d supply, fill #2
  Filled 2021-12-21: qty 25, 8d supply, fill #3

## 2021-07-30 NOTE — Telephone Encounter (Signed)
Nitrostat 0.4 mg SL # 25 x 3 refills sent to [facility]

## 2021-08-07 ENCOUNTER — Ambulatory Visit (INDEPENDENT_AMBULATORY_CARE_PROVIDER_SITE_OTHER): Payer: Medicare Other

## 2021-08-07 ENCOUNTER — Other Ambulatory Visit: Payer: Self-pay

## 2021-08-07 DIAGNOSIS — Z23 Encounter for immunization: Secondary | ICD-10-CM | POA: Diagnosis not present

## 2021-08-07 NOTE — Progress Notes (Deleted)
Ian Harrison is a 87 y.o. male presents to the office today for HD flu shot, per physician's orders. Original order: 08/07/21- Mackie Pai, PA-C HD flu shot,  IM (route) was administered *** Deltoid (location) today. Patient tolerated injection. Patient due for follow up labs/provider appt: {yes/no:20286}. Date due: ***, appt made {yes/no:20286} Patient next injection due: ***, appt made {yes/no:20286}  Given VIS and Consent form.   Creft, Darlis Loan

## 2021-09-21 ENCOUNTER — Other Ambulatory Visit (HOSPITAL_BASED_OUTPATIENT_CLINIC_OR_DEPARTMENT_OTHER): Payer: Self-pay

## 2021-09-28 ENCOUNTER — Other Ambulatory Visit (HOSPITAL_BASED_OUTPATIENT_CLINIC_OR_DEPARTMENT_OTHER): Payer: Self-pay

## 2021-09-28 ENCOUNTER — Other Ambulatory Visit: Payer: Self-pay | Admitting: Cardiology

## 2021-09-28 MED ORDER — METOPROLOL SUCCINATE ER 25 MG PO TB24
ORAL_TABLET | Freq: Every day | ORAL | 1 refills | Status: DC
  Filled 2021-09-28: qty 90, 90d supply, fill #0
  Filled 2022-01-05: qty 90, 90d supply, fill #1

## 2021-10-05 ENCOUNTER — Ambulatory Visit (INDEPENDENT_AMBULATORY_CARE_PROVIDER_SITE_OTHER): Payer: Medicare Other | Admitting: Medical

## 2021-10-05 VITALS — BP 119/50 | HR 68 | Temp 98.5°F | Resp 16 | Ht 71.0 in | Wt 182.2 lb

## 2021-10-05 DIAGNOSIS — I10 Essential (primary) hypertension: Secondary | ICD-10-CM

## 2021-10-05 DIAGNOSIS — I5032 Chronic diastolic (congestive) heart failure: Secondary | ICD-10-CM

## 2021-10-05 DIAGNOSIS — R739 Hyperglycemia, unspecified: Secondary | ICD-10-CM | POA: Diagnosis not present

## 2021-10-05 DIAGNOSIS — I2510 Atherosclerotic heart disease of native coronary artery without angina pectoris: Secondary | ICD-10-CM | POA: Diagnosis not present

## 2021-10-05 DIAGNOSIS — E785 Hyperlipidemia, unspecified: Secondary | ICD-10-CM

## 2021-10-05 NOTE — Patient Instructions (Addendum)
Htn is well controlled today. Continue metoprolol.   CAD. Occasional rare chest pain. None recently. Has nitro available. Most of time very brief seconds and does not have to use nitro. If has to use and if not resolving then go to ED. Continue plavix.  Chf. Clinically stable with no flare presently.   Hyperlipidemia. Continue statin. Future cmp and lipid panel placed to do fasting labs.  Elevated sugar. Will put in future a1c.  Consider covid booster. If you decide not to get then important to test early and start antiviral early in event of covid + test.   Follow up date to be determined after lab review.

## 2021-10-05 NOTE — Progress Notes (Signed)
Subjective:    Patient ID: Ian Harrison, male    DOB: Jan 19, 1927, 87 y.o.   MRN: [id number]  HPI  Pt CAD, elevated sugar and elevated blood pressure.   Pt bp well controlled today. No cardiac or neurologic signs or symptoms.  No hyperglycemic signs or symptoms.   No sob, no chest pain and no pedal edema. No othropnea.   2 covid vaccines.   Last time saw cardiologist A/P in "  " ASSESSMENT:     1. Coronary artery disease involving native coronary artery of native heart without angina pectoris   2. Hypertension, unspecified type   3. Chronic diastolic CHF (congestive heart failure) ([place])   4. AAA (abdominal aortic aneurysm) without rupture (HCC)     PLAN:     He appears to be doing well from a cardiovascular standpoint.  We will continue the patient's current medication regimen.  We will continue his.  He is looking forward to his birthday next month.  He tells me that he do some walking mostly 2-3 times a week.  He is here with his wife today.   The patient is in agreement with the above plan. The patient left the office in stable condition.  The patient will follow up in 1 year or sooner if needed."     Review of Systems  Constitutional:  Negative for chills, fatigue and fever.  HENT:  Negative for congestion, drooling and ear pain.   Respiratory:  Negative for cough, chest tightness, shortness of breath and wheezing.   Cardiovascular:  Negative for chest pain and palpitations.  Gastrointestinal:  Negative for abdominal pain, blood in stool and nausea.  Genitourinary:  Negative for difficulty urinating, enuresis and flank pain.  Musculoskeletal:  Negative for back pain, joint swelling and neck pain.  Skin:  Negative for rash.    Past Medical History:  Diagnosis Date   Aortic aneurysm (HCC)    CAD (coronary artery disease)    Cancer (HCC)    Skin cancer   Chronic kidney disease    Dementia (HCC)    Diabetes mellitus type II, non insulin dependent (HCC)     Dyslipidemia    Intolerance to atorvastatin due to myalgias   Myocardial infarction ([place])    15 years ago   Vasovagal syncope    Without recurrence     Social History   Socioeconomic History   Marital status: Married    Spouse name: Not on file   Number of children: Not on file   Years of education: Not on file   Highest education level: Not on file  Occupational History   Not on file  Tobacco Use   Smoking status: Former   Smokeless tobacco: Never   Tobacco comments:    Quit 17 years ago  Vaping Use   Vaping Use: Never used  Substance and Sexual Activity   Alcohol use: Not Currently   Drug use: Never   Sexual activity: Not on file  Other Topics Concern   Not on file  Social History Narrative   Not on file   Social Determinants of Health   Financial Resource Strain: Low Risk    Difficulty of Paying Living Expenses: Not hard at all  Food Insecurity: No Food Insecurity   Worried About Charity fundraiser in the Last Year: Never true   Ran Out of Food in the Last Year: Never true  Transportation Needs: No Transportation Needs   Lack of Transportation (Medical): No  Lack of Transportation (Non-Medical): No  Physical Activity: Inactive   Days of Exercise per Week: 0 days   Minutes of Exercise per Session: 0 min  Stress: No Stress Concern Present   Feeling of Stress : Not at all  Social Connections: Moderately Isolated   Frequency of Communication with Friends and Family: More than three times a week   Frequency of Social Gatherings with Friends and Family: More than three times a week   Attends Religious Services: Never   Marine scientist or Organizations: No   Attends Music therapist: Never   Marital Status: Married  Human resources officer Violence: Not At Risk   Fear of Current or Ex-Partner: No   Emotionally Abused: No   Physically Abused: No   Sexually Abused: No    Past Surgical History:  Procedure Laterality Date   ABDOMINAL AORTIC  ANEURYSM REPAIR     APPENDECTOMY     CATARACT EXTRACTION W/ INTRAOCULAR LENS IMPLANT     CYSTOSCOPY WITH FULGERATION N/A 06/23/2020   Procedure: CYSTOSCOPY WITH CLOT EVACUATION;  Surgeon: Robley Fries, MD;  Location: [facility];  Service: Urology;  Laterality: N/A;   HERNIA REPAIR     LEFT HEART CATH AND CORONARY ANGIOGRAPHY N/A 07/14/2020   Procedure: LEFT HEART CATH AND CORONARY ANGIOGRAPHY;  Surgeon: Burnell Blanks, MD;  Location: [facility];  Service: Cardiovascular;  Laterality: N/A;   TRANSURETHRAL RESECTION OF BLADDER TUMOR N/A 06/23/2020   Procedure: TRANSURETHRAL RESECTION OF BLADDER TUMOR (TURBT);  Surgeon: Robley Fries, MD;  Location: [facility];  Service: Urology;  Laterality: N/A;  58 MINS   WISDOM TOOTH EXTRACTION      Family History  Problem Relation Age of Onset   Heart attack Brother     Allergies  Allergen Reactions   Atorvastatin Other (See Comments)    myalgia   Morphine And Related Other (See Comments)    Mood changes (anxious)    Current Outpatient Medications on File Prior to Visit  Medication Sig Dispense Refill   aspirin EC 81 MG tablet Take 81 mg by mouth daily with breakfast.      clopidogrel (PLAVIX) 75 MG tablet TAKE 1 TABLET BY MOUTH ONCE DAILY 90 tablet 1   metoprolol succinate (TOPROL-XL) 25 MG 24 hr tablet TAKE 1 TABLET (25 MG TOTAL) BY MOUTH DAILY. 90 tablet 1   nitroGLYCERIN (NITROSTAT) 0.4 MG SL tablet Place 1 tablet (0.4 mg total) under the tongue every 5 (five) minutes as needed for chest pain. 25 tablet 3   tamsulosin (FLOMAX) 0.4 MG CAPS capsule Take 0.4 mg by mouth at bedtime.     tamsulosin (FLOMAX) 0.4 MG CAPS capsule Take 1 capsule by mouth twice a day Please call to schedule f/u that is due. Thanks 60 capsule 3   No current facility-administered medications on file prior to visit.    BP (!) 119/50   Pulse 68   Temp 98.5 F (36.9 C)   Resp 16   Ht 5\' 11"  (1.803 m)   Wt 182 lb 3.2 oz (82.6 kg)   SpO2 99%   BMI  25.41 kg/m       Objective:   Physical Exam   General Mental Status- Alert. General Appearance- Not in acute distress.   Skin General: Color- Normal Color. Moisture- Normal Moisture.  Neck Carotid Arteries- Normal color. Moisture- Normal Moisture. No carotid bruits. No JVD.  Chest and Lung Exam Auscultation: Breath Sounds:-Normal.  Cardiovascular Auscultation:Rythm- Regular. Murmurs &  Other Heart Sounds:Auscultation of the heart reveals- No Murmurs.  Abdomen Inspection:-Inspeection Normal. Palpation/Percussion:Note:No mass. Palpation and Percussion of the abdomen reveal- Non Tender, Non Distended + BS, no rebound or guarding.   Neurologic Cranial Nerve exam:- CN III-XII intact(No nystagmus), symmetric smile. Strength:- 5/5 equal and symmetric strength both upper and lower extremities.    Lower ext- no pedal edema.    Assessment & Plan:   Patient Instructions  Htn is well controlled today. Continue metoprolol.   CAD. Occasional rare chest pain. None recently. Has nitro available. Most of time very brief seconds and does not have to use nitro. If has to use and if not resolving then go to ED. Continue plavix.  Chf. Clinically stable with no flare presently.   Hyperlipidemia. Continue statin. Future cmp and lipid panel placed to do fasting labs.  Elevated sugar. Will put in future a1c.  Consider covid booster. If you decide not to get then important to test early and start antiviral early in event of covid + test.   Follow up date to be determined after lab review.    Mackie Pai, PA-C

## 2021-10-08 ENCOUNTER — Other Ambulatory Visit (INDEPENDENT_AMBULATORY_CARE_PROVIDER_SITE_OTHER): Payer: Medicare Other

## 2021-10-08 ENCOUNTER — Telehealth: Payer: Self-pay | Admitting: Medical

## 2021-10-08 DIAGNOSIS — I10 Essential (primary) hypertension: Secondary | ICD-10-CM | POA: Diagnosis not present

## 2021-10-08 DIAGNOSIS — R739 Hyperglycemia, unspecified: Secondary | ICD-10-CM | POA: Diagnosis not present

## 2021-10-08 DIAGNOSIS — E785 Hyperlipidemia, unspecified: Secondary | ICD-10-CM | POA: Diagnosis not present

## 2021-10-08 LAB — LIPID PANEL
Cholesterol: 129 mg/dL (ref 0–200)
HDL: 53.9 mg/dL (ref >39.00)
LDL Cholesterol: 62 mg/dL (ref 0–99)
NonHDL: 74.6
Total CHOL/HDL Ratio: 2
Triglycerides: 65 mg/dL (ref 0.0–149.0)
VLDL: 13 mg/dL (ref 0.0–40.0)

## 2021-10-08 LAB — COMPREHENSIVE METABOLIC PANEL
ALT: 13 U/L (ref 0–53)
AST: 14 U/L (ref 0–37)
Albumin: 4.3 g/dL (ref 3.5–5.2)
Alkaline Phosphatase: 42 U/L (ref 39–117)
BUN: 18 mg/dL (ref 6–23)
CO2: 28 mEq/L (ref 19–32)
Calcium: 9.2 mg/dL (ref 8.4–10.5)
Chloride: 103 mEq/L (ref 96–112)
Creatinine, Ser: 0.93 mg/dL (ref 0.40–1.50)
GFR: 71.25 mL/min (ref >60.00)
Glucose, Bld: 93 mg/dL (ref 70–99)
Potassium: 4.4 mEq/L (ref 3.5–5.1)
Sodium: 139 mEq/L (ref 135–145)
Total Bilirubin: 1.3 mg/dL — ABNORMAL HIGH (ref 0.2–1.2)
Total Protein: 6.7 g/dL (ref 6.0–8.3)

## 2021-10-08 LAB — HEMOGLOBIN A1C: Hgb A1c MFr Bld: 6 % (ref 4.6–6.5)

## 2021-10-08 NOTE — Telephone Encounter (Signed)
Pt and spouse would like lab results mailed to residence once they're in.

## 2021-10-08 NOTE — Telephone Encounter (Signed)
Will send when labs are back

## 2021-10-28 ENCOUNTER — Other Ambulatory Visit: Payer: Self-pay | Admitting: Cardiology

## 2021-10-28 ENCOUNTER — Other Ambulatory Visit (HOSPITAL_BASED_OUTPATIENT_CLINIC_OR_DEPARTMENT_OTHER): Payer: Self-pay

## 2021-10-28 MED ORDER — CLOPIDOGREL BISULFATE 75 MG PO TABS
ORAL_TABLET | Freq: Every day | ORAL | 1 refills | Status: DC
  Filled 2021-10-28: qty 90, 90d supply, fill #0
  Filled 2022-02-03: qty 90, 90d supply, fill #1

## 2021-11-06 ENCOUNTER — Other Ambulatory Visit (HOSPITAL_BASED_OUTPATIENT_CLINIC_OR_DEPARTMENT_OTHER): Payer: Self-pay

## 2021-11-30 ENCOUNTER — Ambulatory Visit (INDEPENDENT_AMBULATORY_CARE_PROVIDER_SITE_OTHER): Payer: Medicare Other | Admitting: Family Medicine

## 2021-11-30 ENCOUNTER — Other Ambulatory Visit (HOSPITAL_BASED_OUTPATIENT_CLINIC_OR_DEPARTMENT_OTHER): Payer: Self-pay

## 2021-11-30 ENCOUNTER — Encounter: Payer: Self-pay | Admitting: Family Medicine

## 2021-11-30 VITALS — BP 127/50 | HR 66 | Temp 97.9°F | Ht 71.0 in | Wt 183.6 lb

## 2021-11-30 DIAGNOSIS — J40 Bronchitis, not specified as acute or chronic: Secondary | ICD-10-CM | POA: Diagnosis not present

## 2021-11-30 DIAGNOSIS — J329 Chronic sinusitis, unspecified: Secondary | ICD-10-CM | POA: Diagnosis not present

## 2021-11-30 MED ORDER — ALBUTEROL SULFATE HFA 108 (90 BASE) MCG/ACT IN AERS
2.0000 | INHALATION_SPRAY | Freq: Four times a day (QID) | RESPIRATORY_TRACT | 11 refills | Status: DC | PRN
  Filled 2021-11-30: qty 17, 50d supply, fill #0

## 2021-11-30 MED ORDER — DOXYCYCLINE HYCLATE 100 MG PO TABS
100.0000 mg | ORAL_TABLET | Freq: Two times a day (BID) | ORAL | 0 refills | Status: AC
  Filled 2021-11-30: qty 20, 10d supply, fill #0

## 2021-11-30 NOTE — Progress Notes (Signed)
Acute Office Visit  Subjective:    Patient ID: Ian Harrison, male    DOB: 1927/06/29, 88 y.o.   MRN: [id number]  CC: URI, cough    HPI Patient is in today for URI, cough.   Patient reports symptoms have been going on for the past 3 weeks or so. He is reporting congested cough (has not visualized sputum), rhinorrhea, nasal congestion, sinus pressure. He denies any chest pain, shortness of breath, wheezing, GI/GU symptoms. Has been trying supportive measures, but no medications yet. Reports coughing fits that can trigger some shortness of breath.     Past Medical History:  Diagnosis Date   Aortic aneurysm (HCC)    CAD (coronary artery disease)    Cancer (HCC)    Skin cancer   Chronic kidney disease    Dementia (HCC)    Diabetes mellitus type II, non insulin dependent (HCC)    Dyslipidemia    Intolerance to atorvastatin due to myalgias   Myocardial infarction ([place])    15 years ago   Vasovagal syncope    Without recurrence    Past Surgical History:  Procedure Laterality Date   ABDOMINAL AORTIC ANEURYSM REPAIR     APPENDECTOMY     CATARACT EXTRACTION W/ INTRAOCULAR LENS IMPLANT     CYSTOSCOPY WITH FULGERATION N/A 06/23/2020   Procedure: CYSTOSCOPY WITH CLOT EVACUATION;  Surgeon: Robley Fries, MD;  [facility];  Service: Urology;  Laterality: N/A;   HERNIA REPAIR     LEFT HEART CATH AND CORONARY ANGIOGRAPHY N/A 07/14/2020   Procedure: LEFT HEART CATH AND CORONARY ANGIOGRAPHY;  Surgeon: Burnell Blanks, MD;  Location: [facility];  Service: Cardiovascular;  Laterality: N/A;   TRANSURETHRAL RESECTION OF BLADDER TUMOR N/A 06/23/2020   Procedure: TRANSURETHRAL RESECTION OF BLADDER TUMOR (TURBT);  Surgeon: Robley Fries, MD;  [facility];  Service: Urology;  Laterality: N/A;  2 MINS   WISDOM TOOTH EXTRACTION      Family History  Problem Relation Age of Onset   Heart attack Brother     Social History   Socioeconomic History   Marital  status: Married    Spouse name: Not on file   Number of children: Not on file   Years of education: Not on file   Highest education level: Not on file  Occupational History   Not on file  Tobacco Use   Smoking status: Former   Smokeless tobacco: Never   Tobacco comments:    Quit 17 years ago  Vaping Use   Vaping Use: Never used  Substance and Sexual Activity   Alcohol use: Not Currently   Drug use: Never   Sexual activity: Not on file  Other Topics Concern   Not on file  Social History Narrative   Not on file   Social Determinants of Health   Financial Resource Strain: Low Risk    Difficulty of Paying Living Expenses: Not hard at all  Food Insecurity: No Food Insecurity   Worried About Charity fundraiser in the Last Year: Never true   [place] in the Last Year: Never true  Transportation Needs: No Transportation Needs   Lack of Transportation (Medical): No   Lack of Transportation (Non-Medical): No  Physical Activity: Inactive   Days of Exercise per Week: 0 days   Minutes of Exercise per Session: 0 min  Stress: No Stress Concern Present   Feeling of Stress : Not at all  Social Connections: Moderately  Isolated   Frequency of Communication with Friends and Family: More than three times a week   Frequency of Social Gatherings with Friends and Family: More than three times a week   Attends Religious Services: Never   Marine scientist or Organizations: No   Attends Music therapist: Never   Marital Status: Married  Human resources officer Violence: Not At Risk   Fear of Current or Ex-Partner: No   Emotionally Abused: No   Physically Abused: No   Sexually Abused: No    Outpatient Medications Prior to Visit  Medication Sig Dispense Refill   aspirin EC 81 MG tablet Take 81 mg by mouth daily with breakfast.      clopidogrel (PLAVIX) 75 MG tablet TAKE 1 TABLET BY MOUTH ONCE DAILY 90 tablet 1   metoprolol succinate (TOPROL-XL) 25 MG 24 hr tablet TAKE 1  TABLET (25 MG TOTAL) BY MOUTH DAILY. 90 tablet 1   nitroGLYCERIN (NITROSTAT) 0.4 MG SL tablet Place 1 tablet (0.4 mg total) under the tongue every 5 (five) minutes as needed for chest pain. 25 tablet 3   tamsulosin (FLOMAX) 0.4 MG CAPS capsule Take 1 capsule by mouth twice a day Please call to schedule f/u that is due. Thanks 60 capsule 3   tamsulosin (FLOMAX) 0.4 MG CAPS capsule Take 0.4 mg by mouth at bedtime.     No facility-administered medications prior to visit.    Allergies  Allergen Reactions   Atorvastatin Other (See Comments)    myalgia   Morphine And Related Other (See Comments)    Mood changes (anxious)    Review of Systems All review of systems negative except what is listed in the HPI     Objective:    Physical Exam Vitals reviewed.  Constitutional:      Appearance: Normal appearance.  HENT:     Head: Normocephalic and atraumatic.     Right Ear: Tympanic membrane normal.     Left Ear: Tympanic membrane normal.  Cardiovascular:     Rate and Rhythm: Normal rate and regular rhythm.  Pulmonary:     Effort: Pulmonary effort is normal.     Comments: Slightly diminished to left base Skin:    General: Skin is warm and dry.  Neurological:     General: No focal deficit present.     Mental Status: He is alert and oriented to person, place, and time. Mental status is at baseline.  Psychiatric:        Mood and Affect: Mood normal.        Behavior: Behavior normal.        Thought Content: Thought content normal.        Judgment: Judgment normal.      BP (!) 127/50    Pulse 66    Temp 97.9 F (36.6 C)    Ht 5\' 11"  (1.803 m)    Wt 183 lb 9.6 oz (83.3 kg)    SpO2 99%    BMI 25.61 kg/m  Wt Readings from Last 3 Encounters:  11/30/21 183 lb 9.6 oz (83.3 kg)  10/05/21 182 lb 3.2 oz (82.6 kg)  07/14/21 179 lb (81.2 kg)    Health Maintenance Due  Topic Date Due   COVID-19 Vaccine (1) Never done   FOOT EXAM  Never done   OPHTHALMOLOGY EXAM  Never done   URINE  MICROALBUMIN  Never done   TETANUS/TDAP  Never done   Zoster Vaccines- Shingrix (1 of 2) Never done  There are no preventive care reminders to display for this patient.   No results found for: TSH Lab Results  Component Value Date   WBC 5.3 07/28/2020   HGB 12.0 (L) 07/28/2020   HCT 36.7 (L) 07/28/2020   MCV 98.7 07/28/2020   PLT 254 07/28/2020   Lab Results  Component Value Date   NA 139 10/08/2021   K 4.4 10/08/2021   CO2 28 10/08/2021   GLUCOSE 93 10/08/2021   BUN 18 10/08/2021   CREATININE 0.93 10/08/2021   BILITOT 1.3 (H) 10/08/2021   ALKPHOS 42 10/08/2021   AST 14 10/08/2021   ALT 13 10/08/2021   PROT 6.7 10/08/2021   ALBUMIN 4.3 10/08/2021   CALCIUM 9.2 10/08/2021   ANIONGAP 8 07/19/2020   GFR 71.25 10/08/2021   Lab Results  Component Value Date   CHOL 129 10/08/2021   Lab Results  Component Value Date   HDL 53.90 10/08/2021   Lab Results  Component Value Date   LDLCALC 62 10/08/2021   Lab Results  Component Value Date   TRIG 65.0 10/08/2021   Lab Results  Component Value Date   CHOLHDL 2 10/08/2021   Lab Results  Component Value Date   HGBA1C 6.0 10/08/2021       Assessment & Plan:   1. Sinobronchitis Declined CXR today.  Start antibiotics given duration. Use inhaler as needed for shortness of breath, wheezing, or coughing fits.  Continue supportive measures including rest, hydration, humidifier use, steam showers, warm compresses to sinuses, warm liquids with lemon and honey, and over-the-counter cough, cold, and analgesics as needed.   Patient aware of signs/symptoms requiring further/urgent evaluation.   - albuterol (PROAIR HFA) 108 (90 Base) MCG/ACT inhaler; Inhale 2 puffs into the lungs every 6 (six) hours as needed for wheezing.  Dispense: 17 g; Refill: 11 - doxycycline (VIBRA-TABS) 100 MG tablet; Take 1 tablet (100 mg total) by mouth 2 (two) times daily for 10 days.  Dispense: 20 tablet; Refill: 0   Follow-up if symptoms worsen  or fail to improve.   Terrilyn Saver, NP

## 2021-11-30 NOTE — Patient Instructions (Signed)
Start antibiotics. Use inhaler as needed for shortness of breath, wheezing, or coughing fits.  Continue supportive measures including rest, hydration, humidifier use, steam showers, warm compresses to sinuses, warm liquids with lemon and honey, and over-the-counter cough, cold, and analgesics as needed.

## 2021-12-04 ENCOUNTER — Other Ambulatory Visit (HOSPITAL_BASED_OUTPATIENT_CLINIC_OR_DEPARTMENT_OTHER): Payer: Self-pay

## 2021-12-21 ENCOUNTER — Other Ambulatory Visit (HOSPITAL_BASED_OUTPATIENT_CLINIC_OR_DEPARTMENT_OTHER): Payer: Self-pay

## 2022-01-05 ENCOUNTER — Other Ambulatory Visit (HOSPITAL_BASED_OUTPATIENT_CLINIC_OR_DEPARTMENT_OTHER): Payer: Self-pay

## 2022-01-15 ENCOUNTER — Other Ambulatory Visit (HOSPITAL_BASED_OUTPATIENT_CLINIC_OR_DEPARTMENT_OTHER): Payer: Self-pay

## 2022-01-15 ENCOUNTER — Ambulatory Visit (INDEPENDENT_AMBULATORY_CARE_PROVIDER_SITE_OTHER): Payer: Medicare Other | Admitting: Family Medicine

## 2022-01-15 ENCOUNTER — Encounter: Payer: Self-pay | Admitting: Family Medicine

## 2022-01-15 VITALS — BP 127/49 | HR 79 | Temp 98.4°F | Ht 71.0 in | Wt 184.8 lb

## 2022-01-15 DIAGNOSIS — R051 Acute cough: Secondary | ICD-10-CM

## 2022-01-15 DIAGNOSIS — U071 COVID-19: Secondary | ICD-10-CM | POA: Diagnosis not present

## 2022-01-15 LAB — POC COVID19 BINAXNOW
SARS Coronavirus 2 Ag: NEGATIVE
SARS Coronavirus 2 Ag: POSITIVE — AB

## 2022-01-15 MED ORDER — MOLNUPIRAVIR EUA 200MG CAPSULE
4.0000 | ORAL_CAPSULE | Freq: Two times a day (BID) | ORAL | 0 refills | Status: AC
Start: 2022-01-15 — End: 2022-01-20
  Filled 2022-01-15: qty 40, 5d supply, fill #0

## 2022-01-15 NOTE — Progress Notes (Signed)
? ?Acute Office Visit ? ?Subjective:  ? ? Patient ID: Ian Harrison, male    DOB: 1927/10/19, 88 y.o.   MRN: [id number] ? ?CC: fever, URI  ? ? ?HPI ?Patient is in today for fever, URI.  ? ?Patient reports that on Wednesday, 2 days ago, he came down with a fever of 101.7 ?F, chills, sneezing, sore throat, productive cough, rhinorrhea, sinus pressure, occasional shortness of breath, fatigue.  He has continued to run low-grade fevers since then that do improve with Tylenol.  He has not had any headaches, body aches, chest pain, wheezing, nausea, vomiting, diarrhea. ? ?Reports that there have been coworkers at his office with [employer] though he has not been in close contact with them recently.  No other known sick contacts.  No history of seasonal allergies. ? ? ? ? ?Past Medical History:  ?Diagnosis Date  ? Aortic aneurysm ([place])   ? CAD (coronary artery disease)   ? [facility])   ? Skin cancer  ? Chronic kidney disease   ? Dementia ([place])   ? Diabetes mellitus type II, non insulin dependent ([place])   ? Dyslipidemia   ? Intolerance to atorvastatin due to myalgias  ? Myocardial infarction [facility])   ? 15 years ago  ? Vasovagal syncope   ? Without recurrence  ? ? ?Past Surgical History:  ?Procedure Laterality Date  ? ABDOMINAL AORTIC ANEURYSM REPAIR    ? APPENDECTOMY    ? CATARACT EXTRACTION W/ INTRAOCULAR LENS IMPLANT    ? [place] N/A 06/23/2020  ? Procedure: CYSTOSCOPY WITH CLOT EVACUATION;  Surgeon: Robley Fries, MD;  [facility];  Service: Urology;  Laterality: N/A;  ? HERNIA REPAIR    ? LEFT HEART CATH AND CORONARY ANGIOGRAPHY N/A 07/14/2020  ? Procedure: LEFT HEART CATH AND CORONARY ANGIOGRAPHY;  Surgeon: Burnell Blanks, MD;  Location: [facility];  Service: Cardiovascular;  Laterality: N/A;  ? TRANSURETHRAL RESECTION OF BLADDER TUMOR N/A 06/23/2020  ? Procedure: TRANSURETHRAL RESECTION OF BLADDER TUMOR (TURBT);  Surgeon: Robley Fries, MD;  [facility];  Service:  Urology;  Laterality: N/A;  90 MINS  ? WISDOM TOOTH EXTRACTION    ? ? ?Family History  ?Problem Relation Age of Onset  ? Heart attack Brother   ? ? ?Social History  ? ?Socioeconomic History  ? Marital status: Married  ?  Spouse name: Not on file  ? Number of children: Not on file  ? Years of education: Not on file  ? Highest education level: Not on file  ?Occupational History  ? Not on file  ?Tobacco Use  ? Smoking status: Former  ? Smokeless tobacco: Never  ? Tobacco comments:  ?  Quit 17 years ago  ?Vaping Use  ? Vaping Use: Never used  ?Substance and Sexual Activity  ? Alcohol use: Not Currently  ? Drug use: Never  ? Sexual activity: Not on file  ?Other Topics Concern  ? Not on file  ?Social History Narrative  ? Not on file  ? ?Social Determinants of Health  ? ?Financial Resource Strain: Low Risk   ? Difficulty of Paying Living Expenses: Not hard at all  ?Food Insecurity: No Food Insecurity  ? Worried About Charity fundraiser in the Last Year: Never true  ? Ran Out of Food in the Last Year: Never true  ?Transportation Needs: No Transportation Needs  ? Lack of Transportation (Medical): No  ? Lack of Transportation (Non-Medical): No  ?Physical Activity:  Inactive  ? Days of Exercise per Week: 0 days  ? Minutes of Exercise per Session: 0 min  ?Stress: No Stress Concern Present  ? Feeling of Stress : Not at all  ?Social Connections: Moderately Isolated  ? Frequency of Communication with Friends and Family: More than three times a week  ? Frequency of Social Gatherings with Friends and Family: More than three times a week  ? Attends Religious Services: Never  ? Active Member of Clubs or Organizations: No  ? Attends Archivist Meetings: Never  ? Marital Status: Married  ?Intimate Partner Violence: Not At Risk  ? Fear of Current or Ex-Partner: No  ? Emotionally Abused: No  ? Physically Abused: No  ? Sexually Abused: No  ? ? ?Outpatient Medications Prior to Visit  ?Medication Sig Dispense Refill  ? albuterol  (PROAIR HFA) 108 (90 Base) MCG/ACT inhaler Inhale 2 puffs into the lungs every 6 (six) hours as needed for wheezing. 17 g 11  ? aspirin EC 81 MG tablet Take 81 mg by mouth daily with breakfast.     ? clopidogrel (PLAVIX) 75 MG tablet TAKE 1 TABLET BY MOUTH ONCE DAILY 90 tablet 1  ? metoprolol succinate (TOPROL-XL) 25 MG 24 hr tablet TAKE 1 TABLET (25 MG TOTAL) BY MOUTH DAILY. 90 tablet 1  ? nitroGLYCERIN (NITROSTAT) 0.4 MG SL tablet Place 1 tablet (0.4 mg total) under the tongue every 5 (five) minutes as needed for chest pain. 25 tablet 3  ? tamsulosin (FLOMAX) 0.4 MG CAPS capsule Take 1 capsule by mouth twice a day Please call to schedule f/u that is due. Thanks 60 capsule 3  ? ?No facility-administered medications prior to visit.  ? ? ?Allergies  ?Allergen Reactions  ? Atorvastatin Other (See Comments)  ?  myalgia  ? Morphine And Related Other (See Comments)  ?  Mood changes (anxious)  ? ? ?Review of Systems ?All review of systems negative except what is listed in the HPI ? ?   ?Objective:  ?  ?Physical Exam ?Vitals reviewed.  ?Constitutional:   ?   Appearance: Normal appearance.  ?HENT:  ?   Head: Normocephalic and atraumatic.  ?   Right Ear: Tympanic membrane normal.  ?   Left Ear: Tympanic membrane normal.  ?   Nose: Congestion and rhinorrhea present.  ?   Mouth/Throat:  ?   Mouth: Mucous membranes are moist.  ?   Pharynx: Oropharynx is clear. No oropharyngeal exudate or posterior oropharyngeal erythema.  ?Eyes:  ?   Extraocular Movements: Extraocular movements intact.  ?   Conjunctiva/sclera: Conjunctivae normal.  ?Cardiovascular:  ?   Rate and Rhythm: Normal rate and regular rhythm.  ?Pulmonary:  ?   Effort: Pulmonary effort is normal.  ?   Breath sounds: Normal breath sounds. No wheezing, rhonchi or rales.  ?Musculoskeletal:  ?   Cervical back: Normal range of motion and neck supple.  ?Skin: ?   General: Skin is warm and dry.  ?   Capillary Refill: Capillary refill takes less than 2 seconds.  ?Neurological:   ?   Mental Status: He is alert and oriented to person, place, and time. Mental status is at baseline.  ?Psychiatric:     ?   Mood and Affect: Mood normal.     ?   Behavior: Behavior normal.     ?   Thought Content: Thought content normal.     ?   Judgment: Judgment normal.  ? ? ?BP (!) 127/49  Pulse 79   Temp 98.4 ?F (36.9 ?C)   Ht '5\' 11"'$  (1.803 m)   Wt 184 lb 12.8 oz (83.8 kg)   SpO2 97%   BMI 25.77 kg/m?  ?Wt Readings from Last 3 Encounters:  ?01/15/22 184 lb 12.8 oz (83.8 kg)  ?11/30/21 183 lb 9.6 oz (83.3 kg)  ?10/05/21 182 lb 3.2 oz (82.6 kg)  ? ? ?Health Maintenance Due  ?Topic Date Due  ? COVID-19 Vaccine (1) Never done  ? FOOT EXAM  Never done  ? OPHTHALMOLOGY EXAM  Never done  ? URINE MICROALBUMIN  Never done  ? TETANUS/TDAP  Never done  ? Zoster Vaccines- Shingrix (1 of 2) Never done  ? ? ?There are no preventive care reminders to display for this patient. ? ? ?No results found for: TSH ?Lab Results  ?Component Value Date  ? WBC 5.3 07/28/2020  ? HGB 12.0 (L) 07/28/2020  ? HCT 36.7 (L) 07/28/2020  ? MCV 98.7 07/28/2020  ? PLT 254 07/28/2020  ? ?Lab Results  ?Component Value Date  ? NA 139 10/08/2021  ? K 4.4 10/08/2021  ? CO2 28 10/08/2021  ? GLUCOSE 93 10/08/2021  ? BUN 18 10/08/2021  ? CREATININE 0.93 10/08/2021  ? BILITOT 1.3 (H) 10/08/2021  ? ALKPHOS 42 10/08/2021  ? AST 14 10/08/2021  ? ALT 13 10/08/2021  ? PROT 6.7 10/08/2021  ? ALBUMIN 4.3 10/08/2021  ? CALCIUM 9.2 10/08/2021  ? ANIONGAP 8 07/19/2020  ? GFR 71.25 10/08/2021  ? ?Lab Results  ?Component Value Date  ? CHOL 129 10/08/2021  ? ?Lab Results  ?Component Value Date  ? HDL 53.90 10/08/2021  ? ?Lab Results  ?Component Value Date  ? [place] 62 10/08/2021  ? ?Lab Results  ?Component Value Date  ? TRIG 65.0 10/08/2021  ? ?Lab Results  ?Component Value Date  ? CHOLHDL 2 10/08/2021  ? ?Lab Results  ?Component Value Date  ? HGBA1C 6.0 10/08/2021  ? ? ?   ?Assessment & Plan:  ? ?1. Acute cough ?2. COVID-19 ?COVID Positive ?Continue supportive  measures including rest, hydration, humidifier use, steam showers, warm compresses to sinuses, warm liquids with lemon and honey, and over-the-counter cough, cold, and analgesics as needed.  ?Adding molnupiravir.

## 2022-01-15 NOTE — Patient Instructions (Signed)
COVID Positive ?Continue supportive measures including rest, hydration, humidifier use, steam showers, warm compresses to sinuses, warm liquids with lemon and honey, and over-the-counter cough, cold, and analgesics as needed.  ? ?Adding molnupiravir. Review education sheet before starting.  ? ? ?Over the counter medications that may be helpful for symptoms: ? ?Guaifenesin 1200 mg extended release tabs twice daily, with plenty of water ?For cough and congestion ?Brand name: Mucinex   ?Pseudoephedrine 30 mg, one or two tabs every 4 to 6 hours ?For sinus congestion ?Brand name: Sudafed ?You must get this from the pharmacy counter.  ?Oxymetazoline nasal spray each morning, one spray in each nostril, for NO MORE THAN 3 days  ?For nasal and sinus congestion ?Brand name: Afrin ?Saline nasal spray or Saline Nasal Irrigation 3-5 times a day ?For nasal and sinus congestion ?Brand names: [place] or [place] ?Fluticasone nasal spray, one spray in each nostril, each morning (after oxymetazoline and saline, if used) ?For nasal and sinus congestion ?Brand name: Flonase ?Warm salt water gargles  ?For sore throat ?Every few hours as needed ?Alternate ibuprofen 400-600 mg and acetaminophen 1000 mg every 4-6 hours ?For fever, body aches, headache ?Brand names: Motrin or Advil and Tylenol ?Dextromethorphan 12-hour cough version 30 mg every 12 hours  ?For cough ?Brand name: Delsym ?Stop all other cold medications for now (Nyquil, Dayquil, Tylenol Cold, Theraflu, etc) and other non-prescription cough/cold preparations. Many of these have the same ingredients listed above and could cause an overdose of medication.  ? ?Herbal treatments that have been shown to be helpful in some patients include: ?Vitamin C '1000mg'$  per day ?Vitamin D 4000iU per day ?Zinc '100mg'$  per day ?Quercetin 25-'500mg'$  twice a day ?Melatonin 5-'10mg'$  at bedtime ? ?General Instructions ?Allow your body to rest ?Drink PLENTY of fluids ?Isolate yourself from everyone, even family,  until test results have returned ? ?If your COVID-19 test is positive ?Then you ARE INFECTED and you can pass the virus to others ?You must quarantine from others for a minimum of  ?5 days since symptoms started AND ?You are fever free for 24 hours WITHOUT any medication to reduce fever AND ?Your symptoms are improving ?Wear mask for the next 5 days ?If not improved by day 5, quarantine for the full 10 days. ?Do not go to the store or other public areas ?Do not go around household members who are not known to be infected with COVID-19 ?If you MUST leave your area of quarantine (example: go to a bathroom you share with others in your home), you must ?Wear a mask ?Wash your hands thoroughly ?Wipe down any surfaces you touch ?Do not share food, drinks, towels, or other items with other persons ?Dispose of your own tissues, food containers, etc ? ?Once you have recovered, please continue good preventive care measures, including:  ?wearing a mask when in public ?wash your hands frequently ?avoid touching your face/nose/eyes ?cover coughs/sneezes with the inside of your elbow ?stay out of crowds ?keep a 6 foot distance from others ? ?If you develop severe shortness of breath, uncontrolled fevers, coughing up blood, confusion, chest pain, or signs of dehydration (such as significantly decreased urine amounts or dizziness with standing) please go to the ER. ? ? ?

## 2022-01-20 ENCOUNTER — Telehealth: Payer: Self-pay | Admitting: Cardiology

## 2022-01-20 NOTE — Telephone Encounter (Signed)
Spoke with pt's wife, Ian Harrison (ok per DPR). Pt was diagnosed with COVID last Friday 01/15/22. Was seen by PCP, given ABX that he completed yesterday. Ian Harrison notes pt's BP at this PCP visit was 127/49. Ian Harrison was concerned with diastolic BP reading as his normal ranges 60s-70s.  ? ?Ian Harrison rechecked BP the next day on Saturday, BP 118/56.  ?BP yesterday 117/46. HR has remained at pt's normal baseline range of 52-65.  ? ?Ian Harrison wanted to verify that pt was ok to continue Toprol XL 25 mg daily.  ?Pt has been taking this same dose for approx 1 year without any issues.  ? ?Denies associated symptoms including dizziness, light headedness.  ?Pt does have some shortness of breath with exertion that began with beginning of COVID.  ?Otherwise, pt is not symptomatic with BP readings.  ? ?Ian Harrison that pt may continue Toprol XL 25 mg daily with the readings given, and since pt is asymptomatic.  ?Advised d/t recent COVID diagnosis, to continue to monitor BP daily and pt's s/s and let us know of any changes.  ? ?Pt has follow up scheduled with Dr. Geraldo Pitter 02/01/22.  ?Advised that pt bring BP log to visit to discuss lower diastolic readings.  ?Parameters given to Ian Harrison for when to hold beta blockers and then if needs to hold to then call our office.  ?Otherwise, advised to follow up as scheduled.  ? ?Ian Harrison appreciative of call and has no further questions.  ?

## 2022-01-20 NOTE — Telephone Encounter (Signed)
c/o BP issue: STAT if pt c/o blurred vision, one-sided weakness or slurred speech ? ?1. What are your last 5 BP readings? 127/49; 117/46 ? ?2. Are you having any other symptoms (ex. Dizziness, headache, blurred vision, passed out)?   ? ?3. What is your BP issue? Wife wants to know if she should continue to give him metoprolol succinate (TOPROL-XL) 25 MG 24 hr tablet ? ?

## 2022-01-27 ENCOUNTER — Telehealth: Payer: Self-pay | Admitting: Cardiology

## 2022-01-27 NOTE — Telephone Encounter (Signed)
Called pt's wife she states "this is the lowest his blood pressure has been." She doesn't any symptoms at this time. "His balance comes and goes but that is normal for him." Monday 108/46 Yesterday 101/46 Today 97/48 after taking Metoprolol. Pt will be seen by Dr. Geraldo Pitter Monday 4/10 at [facility]. Suggested pt's wife give pt a salty snack and some fluids to help increase his blood pressure. If it continues to decrease she is aware to go to the Emergency Department. She verbalized understanding. Will get message to Dr. Geraldo Pitter and his nurse for review.  ?

## 2022-01-27 NOTE — Telephone Encounter (Signed)
Pt c/o BP issue: STAT if pt c/o blurred vision, one-sided weakness or slurred speech ? ?1. What are your last 5 BP readings? ?Mon.- 108/46 ?Tues. 101/46 ?Wed. 97/48 ? ?2. Are you having any other symptoms (ex. Dizziness, headache, blurred vision, passed out)? No ? ?3. What is your BP issue? Pt's wife was told to monitor pt's BP for a few days and to call if top number went below 100. Please advise ?

## 2022-02-01 ENCOUNTER — Other Ambulatory Visit (HOSPITAL_BASED_OUTPATIENT_CLINIC_OR_DEPARTMENT_OTHER): Payer: Self-pay

## 2022-02-01 ENCOUNTER — Encounter: Payer: Self-pay | Admitting: Cardiology

## 2022-02-01 ENCOUNTER — Ambulatory Visit: Payer: Medicare Other | Admitting: Cardiology

## 2022-02-01 VITALS — BP 106/48 | HR 83 | Ht 71.0 in | Wt 181.0 lb

## 2022-02-01 DIAGNOSIS — I2510 Atherosclerotic heart disease of native coronary artery without angina pectoris: Secondary | ICD-10-CM | POA: Diagnosis not present

## 2022-02-01 DIAGNOSIS — E785 Hyperlipidemia, unspecified: Secondary | ICD-10-CM

## 2022-02-01 DIAGNOSIS — I10 Essential (primary) hypertension: Secondary | ICD-10-CM

## 2022-02-01 DIAGNOSIS — F0390 Unspecified dementia without behavioral disturbance: Secondary | ICD-10-CM | POA: Diagnosis not present

## 2022-02-01 DIAGNOSIS — I2089 Other forms of angina pectoris: Secondary | ICD-10-CM | POA: Insufficient documentation

## 2022-02-01 DIAGNOSIS — I208 Other forms of angina pectoris: Secondary | ICD-10-CM

## 2022-02-01 HISTORY — DX: Other forms of angina pectoris: I20.89

## 2022-02-01 MED ORDER — RANOLAZINE ER 500 MG PO TB12
500.0000 mg | ORAL_TABLET | Freq: Two times a day (BID) | ORAL | 3 refills | Status: DC
  Filled 2022-02-01: qty 60, 30d supply, fill #0
  Filled 2022-02-01: qty 120, 60d supply, fill #0
  Filled 2022-04-29: qty 180, 90d supply, fill #1

## 2022-02-01 NOTE — Patient Instructions (Signed)
Medication Instructions:  ?Your physician has recommended you make the following change in your medication:  ? ?Stop Toprol XL  ? ?Start Ranexa 500 mg twice daily. ?  ?*If you need a refill on your cardiac medications before your next appointment, please call your pharmacy* ? ? ?Lab Work: ?None ordered ?If you have labs (blood work) drawn today and your tests are completely normal, you will receive your results only by: ?MyChart Message (if you have MyChart) OR ?A paper copy in the mail ?If you have any lab test that is abnormal or we need to change your treatment, we will call you to review the results. ? ? ?Testing/Procedures: ?None ordered ? ? ?Follow-Up: ?At [facility], you and your health needs are our priority.  As part of our continuing mission to provide you with exceptional heart care, we have created designated Provider Care Teams.  These Care Teams include your primary Cardiologist (physician) and Advanced Practice Providers (APPs -  Physician Assistants and Nurse Practitioners) who all work together to provide you with the care you need, when you need it. ? ?We recommend signing up for the patient portal called "MyChart".  Sign up information is provided on this After Visit Summary.  MyChart is used to connect with patients for Virtual Visits (Telemedicine).  Patients are able to view lab/test results, encounter notes, upcoming appointments, etc.  Non-urgent messages can be sent to your provider as well.   ?To learn more about what you can do with MyChart, go to [email or web address].   ? ?Your next appointment:   ?6 month(s) ? ?The format for your next appointment:   ?In Person ? ?Provider:   ?Jyl Heinz, MD ? ? ?Other Instructions ?NA ? ?

## 2022-02-01 NOTE — Progress Notes (Signed)
?Cardiology Office Note:   ? ?Date:  02/01/2022  ? ?ID:  Ian Harrison, DOB 05/13/1927, MRN [id number] ? ?PCP:  Mackie Pai, PA-C  ?Cardiologist:  Jenean Lindau, MD  ? ?Referring MD: Mackie Pai, PA-C  ? ? ?ASSESSMENT:   ? ?1. Coronary artery disease involving native coronary artery of native heart without angina pectoris   ?2. Essential hypertension   ?3. Dementia without behavioral disturbance ([place])   ?4. Dyslipidemia   ?5. Stable angina pectoris ([place])   ? ?PLAN:   ? ?In order of problems listed above: ? ?Coronary artery disease with stable angina pectoris: I discussed my findings with the patient at length.  His wife mentions to me that he has an episode of chest pain relieved with nitroglycerin and this happens once or twice a month.  Unfortunately we discontinued beta-blocker because of borderline blood pressure and I think this is appropriate.  In view of this I have initiated Ranexa 500 mg twice daily and benefits and potential is explained to the patient and he vocalized understanding and questions were answered to his and his wife satisfaction. ?Mixed dyslipidemia: Intolerant to statins and does not want to try statin.  I respect his wishes. ?He is on dual antiplatelet therapy.  I do not know him from before.  He was to see my partner who moved from this practice.  In view of stable angina I will continue dual antiplatelet therapy. ?Patient will be seen in follow-up appointment in 6 months or earlier if the patient has any concerns ? ? ? ?Medication Adjustments/Labs and Tests Ordered: ?Current medicines are reviewed at length with the patient today.  Concerns regarding medicines are outlined above.  ?Orders Placed This Encounter  ?Procedures  ? EKG 12-Lead  ? ?Meds ordered this encounter  ?Medications  ? ranolazine (RANEXA) 500 MG 12 hr tablet  ?  Sig: Take 1 tablet (500 mg total) by mouth 2 (two) times daily.  ?  Dispense:  180 tablet  ?  Refill:  3  ? ? ? ?No chief complaint on file. ?   ? ?History of Present Illness:   ? ?Ian Harrison is a 88 y.o. male.  Patient has past medical history of coronary artery disease, essential hypertension, mixed dyslipidemia and statin intolerance.  He denies any problems at this time and takes care of activities.  No chest pain orthopnea.  At the time of my evaluation, the patient is alert awake oriented and in no distress. ? ?Past Medical History:  ?Diagnosis Date  ? AAA (abdominal aortic aneurysm) without rupture ([place]) 09/03/2019  ? Acute lower UTI 07/10/2020  ? Aortic aneurysm ([place])   ? CAD (coronary artery disease)   ? Cancer [facility])   ? Skin cancer  ? Chest pain 07/10/2020  ? Chronic diastolic CHF (congestive heart failure) ([place]) 07/10/2020  ? Chronic kidney disease   ? Coronary artery disease involving native coronary artery of native heart without angina pectoris 09/03/2019  ? Dementia ([place])   ? Dementia without behavioral disturbance ([place]) 09/24/2019  ? Diabetes mellitus type II, non insulin dependent ([place])   ? Dyslipidemia   ? Intolerance to atorvastatin due to myalgias  ? Elevated troponin   ? Essential hypertension 09/03/2019  ? Generalized weakness 07/10/2020  ? Hyperkalemia 07/10/2020  ? Myocardial infarction [facility])   ? 15 years ago  ? Vasovagal syncope   ? Without recurrence  ? ? ?Past Surgical History:  ?Procedure Laterality Date  ? ABDOMINAL AORTIC ANEURYSM REPAIR    ?  APPENDECTOMY    ? CATARACT EXTRACTION W/ INTRAOCULAR LENS IMPLANT    ? [place] N/A 06/23/2020  ? Procedure: CYSTOSCOPY WITH CLOT EVACUATION;  Surgeon: Robley Fries, MD;  Location: [facility] ORS;  Service: Urology;  Laterality: N/A;  ? HERNIA REPAIR    ? LEFT HEART CATH AND CORONARY ANGIOGRAPHY N/A 07/14/2020  ? Procedure: LEFT HEART CATH AND CORONARY ANGIOGRAPHY;  Surgeon: Burnell Blanks, MD;  Location: [facility];  Service: Cardiovascular;  Laterality: N/A;  ? TRANSURETHRAL RESECTION OF BLADDER TUMOR N/A 06/23/2020  ? Procedure: TRANSURETHRAL RESECTION OF  BLADDER TUMOR (TURBT);  Surgeon: Robley Fries, MD;  Location: [facility];  Service: Urology;  Laterality: N/A;  90 MINS  ? WISDOM TOOTH EXTRACTION    ? ? ?Current Medications: ?Current Meds  ?Medication Sig  ? aspirin EC 81 MG tablet Take 81 mg by mouth daily with breakfast.   ? clopidogrel (PLAVIX) 75 MG tablet TAKE 1 TABLET BY MOUTH ONCE DAILY  ? nitroGLYCERIN (NITROSTAT) 0.4 MG SL tablet Place 1 tablet (0.4 mg total) under the tongue every 5 (five) minutes as needed for chest pain.  ? ranolazine (RANEXA) 500 MG 12 hr tablet Take 1 tablet (500 mg total) by mouth 2 (two) times daily.  ? tamsulosin (FLOMAX) 0.4 MG CAPS capsule Take 1 capsule by mouth twice a day Please call to schedule f/u that is due. Thanks  ?  ? ?Allergies:   Atorvastatin and Morphine and related  ? ?Social History  ? ?Socioeconomic History  ? Marital status: Married  ?  Spouse name: Not on file  ? Number of children: Not on file  ? Years of education: Not on file  ? Highest education level: Not on file  ?Occupational History  ? Not on file  ?Tobacco Use  ? Smoking status: Former  ? Smokeless tobacco: Never  ? Tobacco comments:  ?  Quit 17 years ago  ?Vaping Use  ? Vaping Use: Never used  ?Substance and Sexual Activity  ? Alcohol use: Not Currently  ? Drug use: Never  ? Sexual activity: Not on file  ?Other Topics Concern  ? Not on file  ?Social History Narrative  ? Not on file  ? ?Social Determinants of Health  ? ?Financial Resource Strain: Low Risk   ? Difficulty of Paying Living Expenses: Not hard at all  ?Food Insecurity: No Food Insecurity  ? Worried About Charity fundraiser in the Last Year: Never true  ? Ran Out of Food in the Last Year: Never true  ?Transportation Needs: No Transportation Needs  ? Lack of Transportation (Medical): No  ? Lack of Transportation (Non-Medical): No  ?Physical Activity: Inactive  ? Days of Exercise per Week: 0 days  ? Minutes of Exercise per Session: 0 min  ?Stress: No Stress Concern Present  ? Feeling of  Stress : Not at all  ?Social Connections: Moderately Isolated  ? Frequency of Communication with Friends and Family: More than three times a week  ? Frequency of Social Gatherings with Friends and Family: More than three times a week  ? Attends Religious Services: Never  ? Active Member of Clubs or Organizations: No  ? Attends Archivist Meetings: Never  ? Marital Status: Married  ?  ? ?Family History: ?The patient's family history includes Heart attack in his brother. ? ?ROS:   ?Please see the history of present illness.    ?All other systems reviewed and are negative. ? ?EKGs/Labs/Other  Studies Reviewed:   ? ?The following studies were reviewed today: ?I discussed my findings with the patient at length.  EKG reveals anterior and inferior wall myocardial infarction of undetermined age. ? ? ?Recent Labs: ?10/08/2021: ALT 13; BUN 18; Creatinine, Ser 0.93; Potassium 4.4; Sodium 139  ?Recent Lipid Panel ?   ?Component Value Date/Time  ? CHOL 129 10/08/2021 0737  ? TRIG 65.0 10/08/2021 0737  ? HDL 53.90 10/08/2021 0737  ? CHOLHDL 2 10/08/2021 0737  ? VLDL 13.0 10/08/2021 0737  ? [place] 62 10/08/2021 0737  ? ? ?Physical Exam:   ? ?VS:  BP (!) 106/48   Pulse 83   Ht '5\' 11"'$  (1.803 m)   Wt 181 lb 0.6 oz (82.1 kg)   SpO2 96%   BMI 25.25 kg/m?    ? ?Wt Readings from Last 3 Encounters:  ?02/01/22 181 lb 0.6 oz (82.1 kg)  ?01/15/22 184 lb 12.8 oz (83.8 kg)  ?11/30/21 183 lb 9.6 oz (83.3 kg)  ?  ? ?GEN: Patient is in no acute distress ?HEENT: Normal ?NECK: No JVD; No carotid bruits ?LYMPHATICS: No lymphadenopathy ?CARDIAC: Hear sounds regular, 2/6 systolic murmur at the apex. ?RESPIRATORY:  Clear to auscultation without rales, wheezing or rhonchi  ?ABDOMEN: Soft, non-tender, non-distended ?MUSCULOSKELETAL:  No edema; No deformity  ?SKIN: Warm and dry ?NEUROLOGIC:  Alert and oriented x 3 ?PSYCHIATRIC:  Normal affect  ? ?Signed, ?Jenean Lindau, MD  ?02/01/2022 2:05 PM    ?[place]  ?

## 2022-02-02 ENCOUNTER — Other Ambulatory Visit (HOSPITAL_BASED_OUTPATIENT_CLINIC_OR_DEPARTMENT_OTHER): Payer: Self-pay

## 2022-02-03 ENCOUNTER — Other Ambulatory Visit (HOSPITAL_BASED_OUTPATIENT_CLINIC_OR_DEPARTMENT_OTHER): Payer: Self-pay

## 2022-02-08 ENCOUNTER — Other Ambulatory Visit (HOSPITAL_BASED_OUTPATIENT_CLINIC_OR_DEPARTMENT_OTHER): Payer: Self-pay

## 2022-02-17 ENCOUNTER — Telehealth: Payer: Self-pay | Admitting: Medical

## 2022-02-17 NOTE — Telephone Encounter (Signed)
Pt's wife dropped off paperwork for handicap sticker. In a blue folder, 1 page. Pt would like to be called when document is ready for pickup. [phone]. Document left in Edward's folder.  ?

## 2022-02-18 NOTE — Telephone Encounter (Signed)
Form filled out and wife notified ?

## 2022-03-10 ENCOUNTER — Other Ambulatory Visit: Payer: Self-pay | Admitting: Cardiology

## 2022-03-10 ENCOUNTER — Other Ambulatory Visit (HOSPITAL_BASED_OUTPATIENT_CLINIC_OR_DEPARTMENT_OTHER): Payer: Self-pay

## 2022-03-10 MED ORDER — NITROGLYCERIN 0.4 MG SL SUBL
0.4000 mg | SUBLINGUAL_TABLET | SUBLINGUAL | 10 refills | Status: DC | PRN
  Filled 2022-03-10: qty 25, 7d supply, fill #0
  Filled 2022-05-31: qty 25, 7d supply, fill #1
  Filled 2022-08-09: qty 25, 7d supply, fill #2
  Filled 2022-09-09: qty 25, 7d supply, fill #3
  Filled 2022-10-12: qty 25, 7d supply, fill #4
  Filled 2022-12-07: qty 25, 7d supply, fill #5
  Filled 2023-01-13: qty 25, 7d supply, fill #6
  Filled 2023-01-21: qty 25, 7d supply, fill #7
  Filled 2023-01-28: qty 25, 7d supply, fill #8

## 2022-03-11 ENCOUNTER — Other Ambulatory Visit (HOSPITAL_BASED_OUTPATIENT_CLINIC_OR_DEPARTMENT_OTHER): Payer: Self-pay

## 2022-03-20 IMAGING — CT CT RENAL STONE PROTOCOL
2 of 4 series · 15 of 46 positions shown, 17 images · non-contrast
Comparison: None.

CLINICAL DATA: [AGE] with hematuria and urinary frequency
today.

EXAM:
CT ABDOMEN AND PELVIS WITHOUT CONTRAST
TECHNIQUE: Multidetector CT imaging of the abdomen and pelvis was performed
following the standard protocol without IV contrast.

[Series 2: axial st · axial · 0.78mm/px · z∈[-748,-348]mm · 12 of 88 slices shown, 14 images]
[im 4/88  soft-tissue]
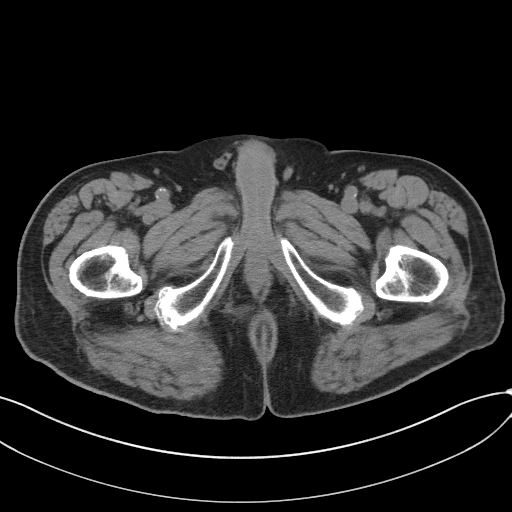
[im 4/88  bone]
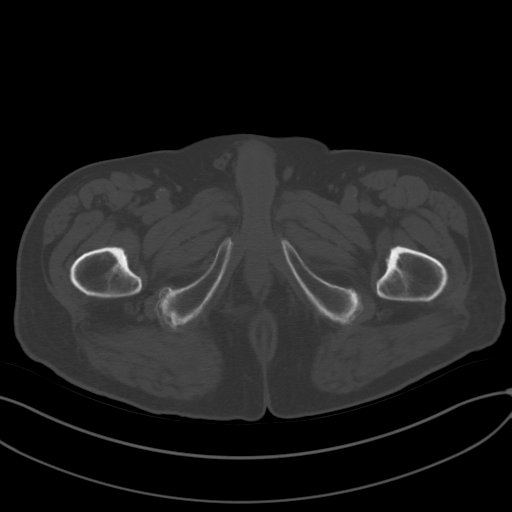
[im 11/88  soft-tissue]
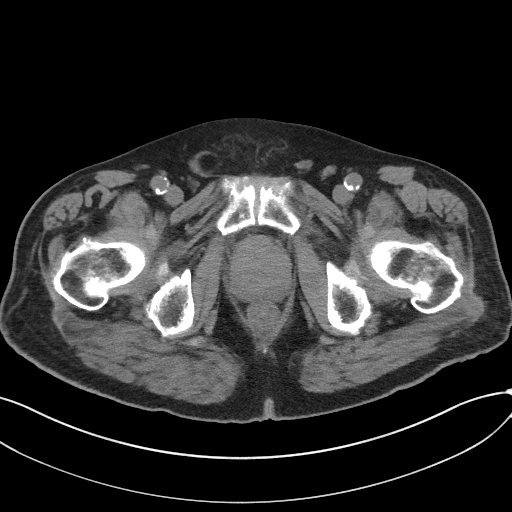
[im 19/88  soft-tissue]
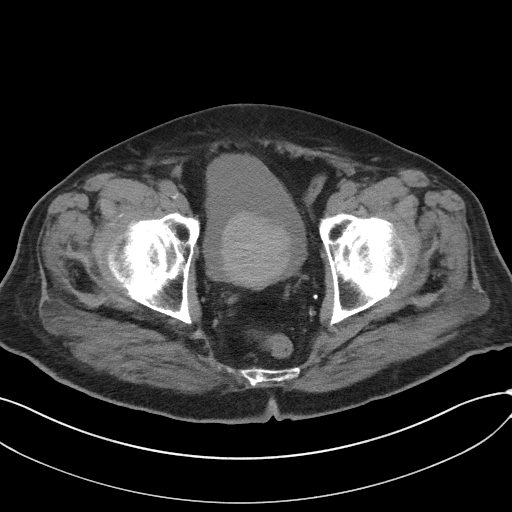
[im 26/88  soft-tissue]
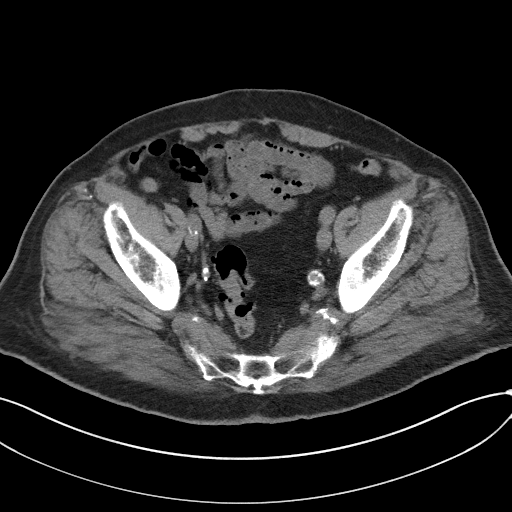
[im 33/88  soft-tissue]
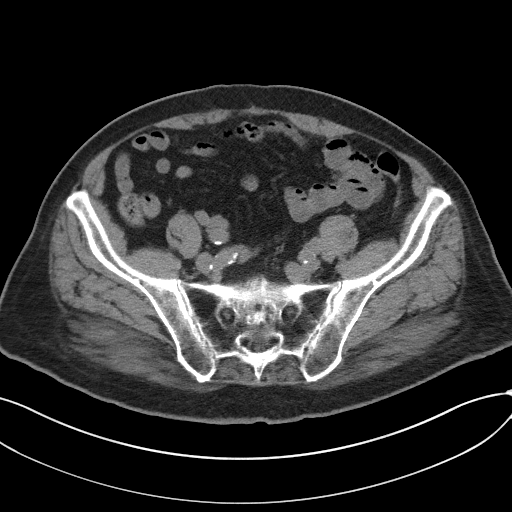
[im 40/88  soft-tissue]
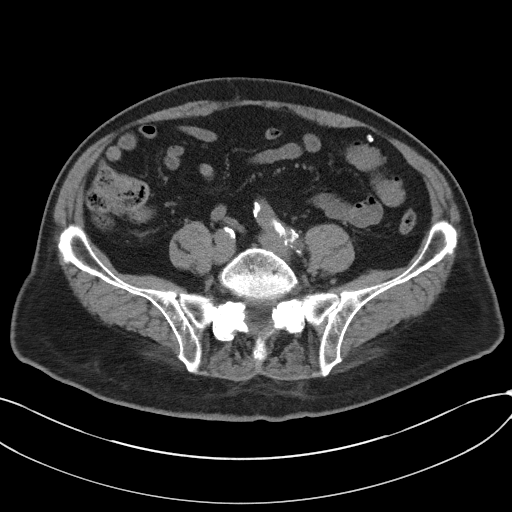
[im 48/88  soft-tissue]
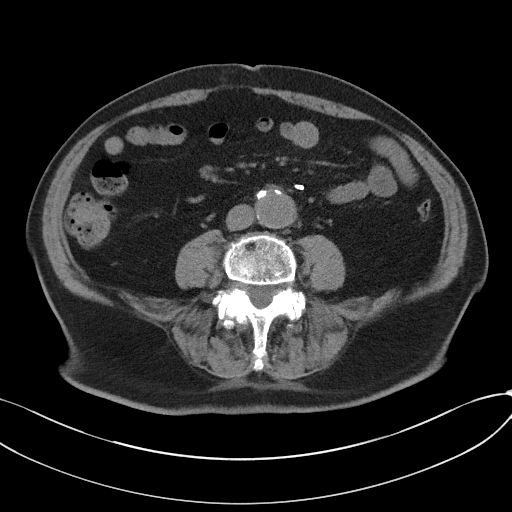
[im 55/88  soft-tissue]
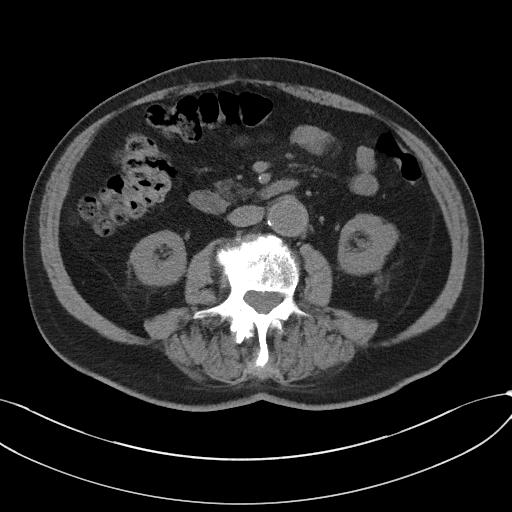
[im 62/88  soft-tissue]
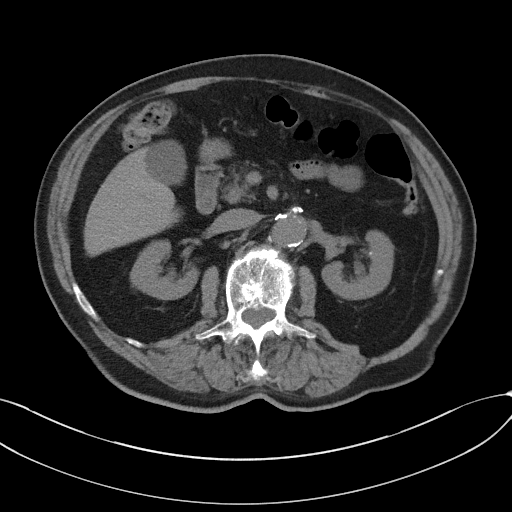
[im 62/88  bone]
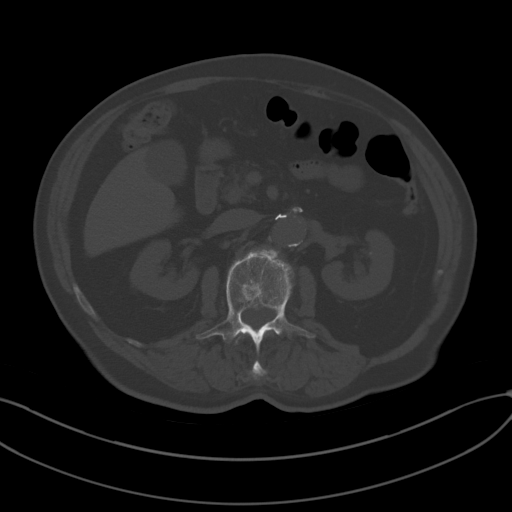
[im 69/88  soft-tissue]
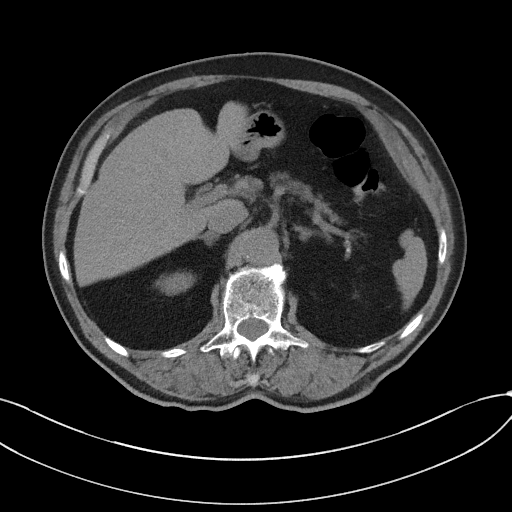
[im 77/88  soft-tissue]
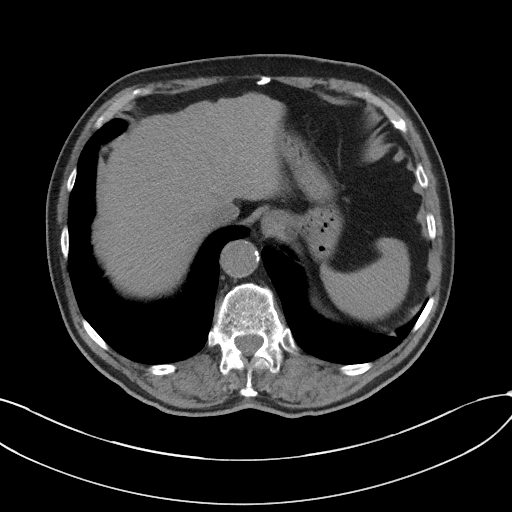
[im 84/88  soft-tissue]
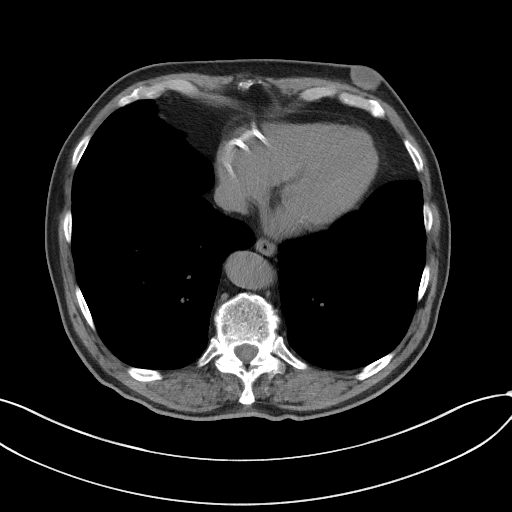

[Series 4: coronal st · coronal · 0.88mm/px · 3 of 112 slices shown]
[im 38/112  soft-tissue]
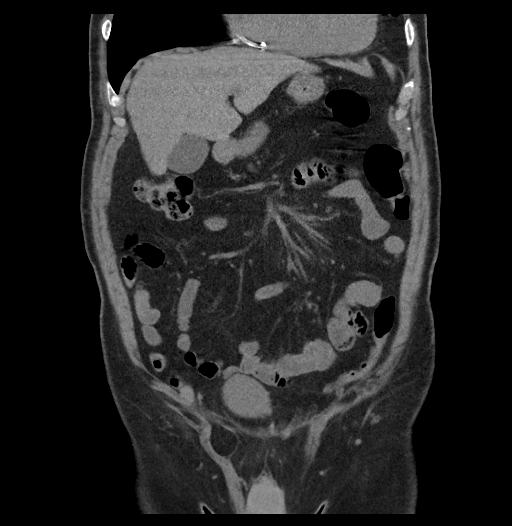
[im 50/112  soft-tissue]
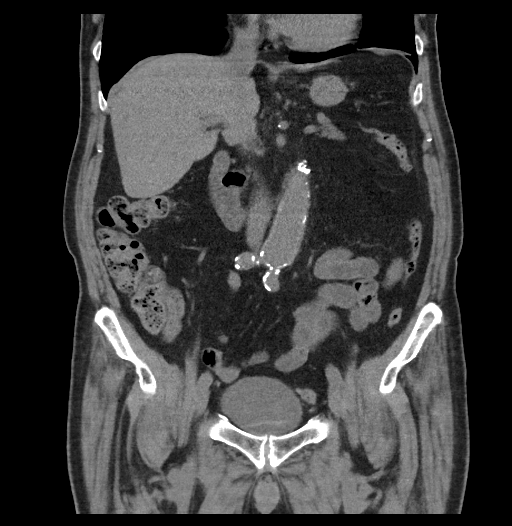
[im 62/112  soft-tissue]
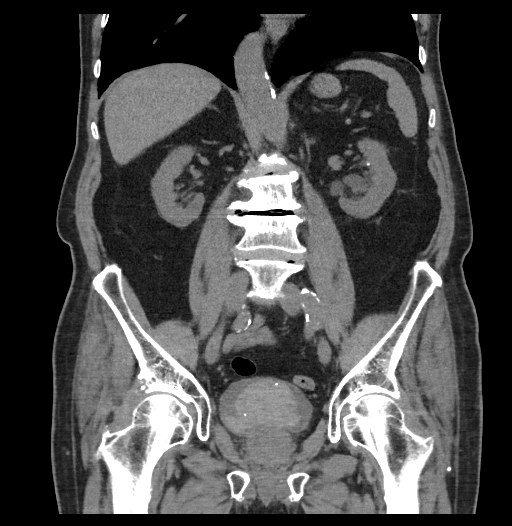

[15 of 46 positions shown; findings below may reference images not displayed]

FINDINGS: Lower chest: No acute airspace disease or pleural effusion. The
heart is normal in size. Coronary artery calcifications versus
stent. Tortuous descending thoracic aorta with atherosclerosis.

Hepatobiliary: No focal hepatic abnormality on noncontrast exam.
Calcified 16 mm intraluminal gallstone. No pericholecystic
inflammation or fluid. No biliary dilatation.

Pancreas: Fatty atrophy.  No ductal dilatation or inflammation.

Spleen: Normal in size without focal abnormality.

Adrenals/Urinary Tract: 12 mm low-density right adrenal nodule with
Hounsfield units of 7 consistent with adenoma. The left adrenal
gland is normal. Small bilateral nonobstructing renal calculi. No
right hydronephrosis. Suspected parapelvic cysts in the left kidney
rather than left hydronephrosis. There is no definite calyceal
dilatation. Both ureters are decompressed. No ureteral stone.

Within the urinary bladder at the bladder base is hyperdense rounded
structure measuring 6.9 x 3.7 cm. There are scattered small
calcifications that appear to be within this structure rather than
adjacent to. Lobulated components extend into the right aspect of
the urinary trigone. There is mass effect on the bladder base from
enlarged prostate gland, however this appears different density of
the enlarged prostate. There is no perivesicular fat stranding.

Stomach/Bowel: Colonic diverticulosis. No evidence of
diverticulitis. Appendix not definitively visualized. Scattered
fecalization of small bowel contents without obstruction or
inflammation. Stomach is unremarkable. There is a periampullary
duodenal diverticulum without inflammatory change.

Vascular/Lymphatic: Aortic atherosclerosis. Suspect prior aortic
aneurysm repair. There is a Jew[place] aortic lumen measures 3 cm
greatest dimension. There are surgical clips at the iliac
bifurcation. Calcified and tortuous iliac vessels without aneurysmal
dilatation. No pelvic sidewall or inguinal adenopathy. No
retroperitoneal adenopathy. No upper abdominal adenopathy.

Reproductive: Mildly enlarged prostate gland causes mass effect on
the bladder base. Prostate measures 4.9 x 4.7 x 4.3 cm (volume = 52
cm^3).

Other: No ascites/free fluid. No free air. Fat in the right inguinal
canal. Postsurgical change of the anterior abdominal wall.

Musculoskeletal: There are no acute or suspicious osseous
abnormalities. Degenerative change throughout the lumbar spine.
There are Modic endplate changes at L3-L4. Prominent Schmorl's node
inferior endplate of L2. No evidence of focal bone lesion.
IMPRESSION: 1. Hyperdense rounded structure in the urinary bladder at the
bladder base measuring 6.9 x 3.7 cm. There are scattered small
calcifications that appear to be within this structure rather than
adjacent to the bladder. Differential considerations include bladder
neoplasm versus chronic blood clot. Recommend direct visualization
with cystoscopy.
2. Mildly enlarged prostate gland causing mass effect on the bladder
base. Bladder lesion appears separate from the enlarged prostate
gland.
3. Bilateral nonobstructing renal calculi. Probable parapelvic cysts
in the left kidney.
4. Cholelithiasis without gallbladder inflammation.
5. Colonic diverticulosis without diverticulitis. Suspect prior
infrarenal aortic aneurysm repair. Residual aortic dimension
measures 3 cm.

Aortic Atherosclerosis ([id number]).

## 2022-03-31 ENCOUNTER — Ambulatory Visit (INDEPENDENT_AMBULATORY_CARE_PROVIDER_SITE_OTHER): Payer: Medicare Other | Admitting: Medical

## 2022-03-31 VITALS — BP 114/86 | HR 62 | Temp 97.9°F | Resp 18 | Ht 71.0 in | Wt 176.8 lb

## 2022-03-31 DIAGNOSIS — D649 Anemia, unspecified: Secondary | ICD-10-CM | POA: Diagnosis not present

## 2022-03-31 DIAGNOSIS — I2510 Atherosclerotic heart disease of native coronary artery without angina pectoris: Secondary | ICD-10-CM

## 2022-03-31 DIAGNOSIS — K5900 Constipation, unspecified: Secondary | ICD-10-CM

## 2022-03-31 DIAGNOSIS — I10 Essential (primary) hypertension: Secondary | ICD-10-CM

## 2022-03-31 DIAGNOSIS — R739 Hyperglycemia, unspecified: Secondary | ICD-10-CM

## 2022-03-31 NOTE — Patient Instructions (Addendum)
CAD- continue current meds rx'd by Dr. Cherrie Gauze. Ranexa, plavix and nitro(if needed)  Htn- bp well controlled. Bp improved and b blocker stopped.  High cholesterol- declining statin due to prior side effects. Pt wishes not to take. Future lipid panel and cmp.  Anemia  on review. Cbc and iron check future. Lab.  Elevated sugar- future a1c.   Consider flu and covid vaccine in fall  Constipation-  mild. Advised stay hydrated, get mild exercise, eat high fiber diet, (miralax or dulcolax if 2 days pass no bm) and can use mag citrate if 3 days pass and no bm. If any severe pain, bloating or med failure)  Follow up in 6 months or sooner if needed

## 2022-03-31 NOTE — Progress Notes (Signed)
Subjective:    Patient ID: Bartholomew Ramesh, male    DOB: [birth year], 88 y.o.   MRN: [id number]  HPI  Pt CAD, elevated sugar and elevated blood pressure.     Pt bp well controlled today. No cardiac or neurologic signs or symptoms.   No hyperglycemic signs or symptoms.     No sob, no chest pain and no pedal edema. No othropnea.  Pt has seen cardiologist 02/01/22  ASSESSMENT:     1. Coronary artery disease involving native coronary artery of native heart without angina pectoris   2. Essential hypertension   3. Dementia without behavioral disturbance ([place])   4. Dyslipidemia   5. Stable angina pectoris (HCC)     PLAN:     In order of problems listed above:   Coronary artery disease with stable angina pectoris: I discussed my findings with the patient at length.  His wife mentions to me that he has an episode of chest pain relieved with nitroglycerin and this happens once or twice a month.  Unfortunately we discontinued beta-blocker because of borderline blood pressure and I think this is appropriate.  In view of this I have initiated Ranexa 500 mg twice daily and benefits and potential is explained to the patient and he vocalized understanding and questions were answered to his and his wife satisfaction. Mixed dyslipidemia: Intolerant to statins and does not want to try statin.  I respect his wishes. He is on dual antiplatelet therapy.  I do not know him from before.  He was to see my partner who moved from this practice.  In view of stable angina I will continue dual antiplatelet therapy. Patient will be seen in follow-up appointment in 6 months or earlier if the patient has any concerns   Pt has some dementia. Wife states he never saw neurologist. He has been stable for 10 years. Indicates neurologist not needed. She states she had some difficulty in past dealing with dx. Keshav is very pleasant and he and wife get along well.    Review of Systems  Constitutional:  Negative  for chills, fatigue and fever.  HENT:  Negative for congestion, drooling and ear pain.   Respiratory:  Negative for cough, chest tightness, shortness of breath and wheezing.   Cardiovascular:  Negative for chest pain and palpitations.  Gastrointestinal:  Negative for abdominal pain, blood in stool and nausea.       Constipation- at time will go 3-4 days with no bm.  Genitourinary:  Negative for difficulty urinating, enuresis and flank pain.  Musculoskeletal:  Negative for back pain, joint swelling and neck pain.  Skin:  Negative for rash.  Neurological:        Dementia stable.   Psychiatric/Behavioral:         Pleasant and cooperative. Good relationship with wife.    Past Medical History:  Diagnosis Date   AAA (abdominal aortic aneurysm) without rupture ([place]) 09/03/2019   Acute lower UTI 07/10/2020   Aortic aneurysm (HCC)    CAD (coronary artery disease)    Cancer (HCC)    Skin cancer   Chest pain 07/10/2020   Chronic diastolic CHF (congestive heart failure) ([place]) 07/10/2020   Chronic kidney disease    Coronary artery disease involving native coronary artery of native heart without angina pectoris 09/03/2019   Dementia ([place])    Dementia without behavioral disturbance ([place]) 09/24/2019   Diabetes mellitus type II, non insulin dependent (HCC)    Dyslipidemia    Intolerance to  atorvastatin due to myalgias   Elevated troponin    Essential hypertension 09/03/2019   Generalized weakness 07/10/2020   Hyperkalemia 07/10/2020   Myocardial infarction ([place])    15 years ago   Vasovagal syncope    Without recurrence     Social History   Socioeconomic History   Marital status: Married    Spouse name: Not on file   Number of children: Not on file   Years of education: Not on file   Highest education level: Not on file  Occupational History   Not on file  Tobacco Use   Smoking status: Former   Smokeless tobacco: Never   Tobacco comments:    Quit 17 years ago  Vaping Use   Vaping Use:  Never used  Substance and Sexual Activity   Alcohol use: Not Currently   Drug use: Never   Sexual activity: Not on file  Other Topics Concern   Not on file  Social History Narrative   Not on file   Social Determinants of Health   Financial Resource Strain: Low Risk    Difficulty of Paying Living Expenses: Not hard at all  Food Insecurity: No Food Insecurity   Worried About Charity fundraiser in the Last Year: Never true   [place] in the Last Year: Never true  Transportation Needs: No Transportation Needs   Lack of Transportation (Medical): No   Lack of Transportation (Non-Medical): No  Physical Activity: Inactive   Days of Exercise per Week: 0 days   Minutes of Exercise per Session: 0 min  Stress: No Stress Concern Present   Feeling of Stress : Not at all  Social Connections: Moderately Isolated   Frequency of Communication with Friends and Family: More than three times a week   Frequency of Social Gatherings with Friends and Family: More than three times a week   Attends Religious Services: Never   Marine scientist or Organizations: No   Attends Music therapist: Never   Marital Status: Married  Human resources officer Violence: Not At Risk   Fear of Current or Ex-Partner: No   Emotionally Abused: No   Physically Abused: No   Sexually Abused: No    Past Surgical History:  Procedure Laterality Date   ABDOMINAL AORTIC ANEURYSM REPAIR     APPENDECTOMY     CATARACT EXTRACTION W/ INTRAOCULAR LENS IMPLANT     CYSTOSCOPY WITH FULGERATION N/A 06/23/2020   Procedure: CYSTOSCOPY WITH CLOT EVACUATION;  Surgeon: Robley Fries, MD;  Location: [facility];  Service: Urology;  Laterality: N/A;   HERNIA REPAIR     LEFT HEART CATH AND CORONARY ANGIOGRAPHY N/A 07/14/2020   Procedure: LEFT HEART CATH AND CORONARY ANGIOGRAPHY;  Surgeon: Burnell Blanks, MD;  Location: [facility];  Service: Cardiovascular;  Laterality: N/A;   TRANSURETHRAL RESECTION OF  BLADDER TUMOR N/A 06/23/2020   Procedure: TRANSURETHRAL RESECTION OF BLADDER TUMOR (TURBT);  Surgeon: Robley Fries, MD;  Location: [facility];  Service: Urology;  Laterality: N/A;  39 MINS   WISDOM TOOTH EXTRACTION      Family History  Problem Relation Age of Onset   Heart attack Brother     Allergies  Allergen Reactions   Atorvastatin Other (See Comments)    myalgia   Morphine And Related Other (See Comments)    Mood changes (anxious)    Current Outpatient Medications on File Prior to Visit  Medication Sig Dispense Refill   aspirin EC  81 MG tablet Take 81 mg by mouth daily with breakfast.      clopidogrel (PLAVIX) 75 MG tablet TAKE 1 TABLET BY MOUTH ONCE DAILY 90 tablet 1   nitroGLYCERIN (NITROSTAT) 0.4 MG SL tablet Place 1 tablet (0.4 mg total) under the tongue every 5 (five) minutes as needed for chest pain. 25 tablet 10   ranolazine (RANEXA) 500 MG 12 hr tablet Take 1 tablet (500 mg total) by mouth 2 (two) times daily. 180 tablet 3   tamsulosin (FLOMAX) 0.4 MG CAPS capsule Take 1 capsule by mouth twice a day Please call to schedule f/u that is due. Thanks 60 capsule 3   No current facility-administered medications on file prior to visit.    BP 114/86   Pulse 62   Temp 97.9 F (36.6 C)   Resp 18   Ht '5\' 11"'$  (1.803 m)   Wt 176 lb 12.8 oz (80.2 kg)   SpO2 97%   BMI 24.66 kg/m       Objective:   Physical Exam  General- No acute distress. Pleasant patient. Neck- Full range of motion, no jvd Lungs- Clear, even and unlabored. Heart- regular rate and rhythm. Neurologic- CNII- XII grossly intact.  Abdomen- soft, nt, nd, +bs, no rebond or guarding. No organomegally.    Assessment & Plan:   Patient Instructions  CAD- continue current meds rx'd by Dr. Cherrie Gauze. Ranexa, plavix and nitro(if needed)  Htn- bp well controlled. Bp improved and b blocker stopped.  High cholesterol- declining statin due to prior side effects. Pt wishes not to take. Future lipid panel and  cmp.  Anemia  on review. Cbc and iron check future. Lab.  Elevated sugar- future a1c.   Consider flu and covid vaccine in fall  Constipation-  mild. Advised stay hydrated, get mild exercise, eat high fiber diet, (miralax or dulcolax if 2 days pass no bm) and can use mag citrate if 3 days pass and no bm. If any severe pain, bloating or med failure)  Follow up in 6 months or sooner if needed      [employer], PA-C A

## 2022-04-07 ENCOUNTER — Other Ambulatory Visit (HOSPITAL_BASED_OUTPATIENT_CLINIC_OR_DEPARTMENT_OTHER): Payer: Self-pay

## 2022-04-07 MED ORDER — TAMSULOSIN HCL 0.4 MG PO CAPS
ORAL_CAPSULE | ORAL | 3 refills | Status: DC
  Filled 2022-04-07: qty 60, 30d supply, fill #0
  Filled 2022-06-07: qty 60, 30d supply, fill #1
  Filled 2022-08-13: qty 60, 30d supply, fill #2
  Filled 2022-10-21: qty 60, 30d supply, fill #3

## 2022-04-08 ENCOUNTER — Other Ambulatory Visit (INDEPENDENT_AMBULATORY_CARE_PROVIDER_SITE_OTHER): Payer: Medicare Other

## 2022-04-08 DIAGNOSIS — I2510 Atherosclerotic heart disease of native coronary artery without angina pectoris: Secondary | ICD-10-CM

## 2022-04-08 DIAGNOSIS — D649 Anemia, unspecified: Secondary | ICD-10-CM | POA: Diagnosis not present

## 2022-04-08 DIAGNOSIS — I10 Essential (primary) hypertension: Secondary | ICD-10-CM

## 2022-04-08 DIAGNOSIS — R739 Hyperglycemia, unspecified: Secondary | ICD-10-CM

## 2022-04-08 LAB — CBC WITH DIFFERENTIAL/PLATELET
Basophils Absolute: 0 10*3/uL (ref 0.0–0.1)
Basophils Relative: 0.8 % (ref 0.0–3.0)
Eosinophils Absolute: 0.1 10*3/uL (ref 0.0–0.7)
Eosinophils Relative: 2 % (ref 0.0–5.0)
HCT: 33.8 % — ABNORMAL LOW (ref 39.0–52.0)
Hemoglobin: 11.5 g/dL — ABNORMAL LOW (ref 13.0–17.0)
Lymphocytes Relative: 48.1 % — ABNORMAL HIGH (ref 12.0–46.0)
Lymphs Abs: 2 10*3/uL (ref 0.7–4.0)
MCHC: 34.1 g/dL (ref 30.0–36.0)
MCV: 103.6 fl — ABNORMAL HIGH (ref 78.0–100.0)
Monocytes Absolute: 0.4 10*3/uL (ref 0.1–1.0)
Monocytes Relative: 10.5 % (ref 3.0–12.0)
Neutro Abs: 1.6 10*3/uL (ref 1.4–7.7)
Neutrophils Relative %: 38.6 % — ABNORMAL LOW (ref 43.0–77.0)
Platelets: 140 10*3/uL — ABNORMAL LOW (ref 150.0–400.0)
RBC: 3.26 Mil/uL — ABNORMAL LOW (ref 4.22–5.81)
RDW: 15.5 % (ref 11.5–15.5)
WBC: 4.1 10*3/uL (ref 4.0–10.5)

## 2022-04-08 LAB — LIPID PANEL
Cholesterol: 129 mg/dL (ref 0–200)
HDL: 51.4 mg/dL (ref >39.00)
LDL Cholesterol: 66 mg/dL (ref 0–99)
NonHDL: 77.39
Total CHOL/HDL Ratio: 3
Triglycerides: 59 mg/dL (ref 0.0–149.0)
VLDL: 11.8 mg/dL (ref 0.0–40.0)

## 2022-04-08 LAB — COMPREHENSIVE METABOLIC PANEL
ALT: 9 U/L (ref 0–53)
AST: 9 U/L (ref 0–37)
Albumin: 4.2 g/dL (ref 3.5–5.2)
Alkaline Phosphatase: 38 U/L — ABNORMAL LOW (ref 39–117)
BUN: 17 mg/dL (ref 6–23)
CO2: 28 mEq/L (ref 19–32)
Calcium: 9.1 mg/dL (ref 8.4–10.5)
Chloride: 106 mEq/L (ref 96–112)
Creatinine, Ser: 0.95 mg/dL (ref 0.40–1.50)
GFR: 69.21 mL/min (ref >60.00)
Glucose, Bld: 104 mg/dL — ABNORMAL HIGH (ref 70–99)
Potassium: 4.7 mEq/L (ref 3.5–5.1)
Sodium: 142 mEq/L (ref 135–145)
Total Bilirubin: 1.3 mg/dL — ABNORMAL HIGH (ref 0.2–1.2)
Total Protein: 6.4 g/dL (ref 6.0–8.3)

## 2022-04-08 LAB — HEMOGLOBIN A1C: Hgb A1c MFr Bld: 5.8 % (ref 4.6–6.5)

## 2022-04-08 LAB — IRON: Iron: 176 ug/dL — ABNORMAL HIGH (ref 42–165)

## 2022-04-29 ENCOUNTER — Other Ambulatory Visit (HOSPITAL_BASED_OUTPATIENT_CLINIC_OR_DEPARTMENT_OTHER): Payer: Self-pay

## 2022-05-03 ENCOUNTER — Other Ambulatory Visit (HOSPITAL_BASED_OUTPATIENT_CLINIC_OR_DEPARTMENT_OTHER): Payer: Self-pay

## 2022-05-03 ENCOUNTER — Other Ambulatory Visit: Payer: Self-pay | Admitting: Cardiology

## 2022-05-03 MED ORDER — CLOPIDOGREL BISULFATE 75 MG PO TABS
75.0000 mg | ORAL_TABLET | Freq: Every day | ORAL | 2 refills | Status: DC
  Filled 2022-05-03: qty 90, 90d supply, fill #0
  Filled 2022-08-09: qty 90, 90d supply, fill #1
  Filled 2022-12-01: qty 90, 90d supply, fill #2

## 2022-05-10 ENCOUNTER — Telehealth: Payer: Self-pay | Admitting: Cardiology

## 2022-05-10 NOTE — Telephone Encounter (Signed)
Pt c/o medication issue:  1. Name of Medication: Ranexa  2. How are you currently taking this medication (dosage and times per day)? 2 times a day  3. Are you having a reaction (difficulty breathing--STAT)?   4. What is your medication issue? Making him constipated and nothing is helping him with the constipation

## 2022-05-10 NOTE — Telephone Encounter (Signed)
Spoke with pt's wife [name]- per DPR. She started that pt has not had any problems with constipation until starting Ranexa. Pt's PCP recommended the pt use Miralax, Dulcolax and then Mag Citrate if needed. The pt has used Miralax and Dulcolax with poor results. Pts wife is now questioning if there is another medication instead of Ranexa that the pt can take that may not cause constipation.

## 2022-05-10 NOTE — Telephone Encounter (Signed)
Notified pt's spouse that Dr. Geraldo Pitter recommended that they discontinue the Ranexa. She verbalized understanding and had no further questions.

## 2022-05-31 ENCOUNTER — Other Ambulatory Visit (HOSPITAL_BASED_OUTPATIENT_CLINIC_OR_DEPARTMENT_OTHER): Payer: Self-pay

## 2022-06-07 ENCOUNTER — Other Ambulatory Visit (HOSPITAL_BASED_OUTPATIENT_CLINIC_OR_DEPARTMENT_OTHER): Payer: Self-pay

## 2022-08-08 ENCOUNTER — Other Ambulatory Visit: Payer: Self-pay

## 2022-08-08 ENCOUNTER — Emergency Department (HOSPITAL_BASED_OUTPATIENT_CLINIC_OR_DEPARTMENT_OTHER): Payer: Medicare Other

## 2022-08-08 ENCOUNTER — Emergency Department (HOSPITAL_BASED_OUTPATIENT_CLINIC_OR_DEPARTMENT_OTHER)
Admission: EM | Admit: 2022-08-08 | Discharge: 2022-08-08 | Disposition: A | Discharge status: Home / Self Care | Payer: Medicare Other | Attending: Emergency Medicine | Admitting: Emergency Medicine

## 2022-08-08 ENCOUNTER — Encounter (HOSPITAL_BASED_OUTPATIENT_CLINIC_OR_DEPARTMENT_OTHER): Payer: Self-pay | Admitting: Emergency Medicine

## 2022-08-08 ENCOUNTER — Telehealth: Payer: Self-pay

## 2022-08-08 DIAGNOSIS — I2510 Atherosclerotic heart disease of native coronary artery without angina pectoris: Secondary | ICD-10-CM | POA: Insufficient documentation

## 2022-08-08 DIAGNOSIS — E1122 Type 2 diabetes mellitus with diabetic chronic kidney disease: Secondary | ICD-10-CM | POA: Diagnosis not present

## 2022-08-08 DIAGNOSIS — F0390 Unspecified dementia without behavioral disturbance: Secondary | ICD-10-CM | POA: Diagnosis not present

## 2022-08-08 DIAGNOSIS — Z85828 Personal history of other malignant neoplasm of skin: Secondary | ICD-10-CM | POA: Diagnosis not present

## 2022-08-08 DIAGNOSIS — N189 Chronic kidney disease, unspecified: Secondary | ICD-10-CM | POA: Diagnosis not present

## 2022-08-08 DIAGNOSIS — Z7982 Long term (current) use of aspirin: Secondary | ICD-10-CM | POA: Insufficient documentation

## 2022-08-08 DIAGNOSIS — M545 Low back pain, unspecified: Secondary | ICD-10-CM | POA: Insufficient documentation

## 2022-08-08 DIAGNOSIS — W19XXXA Unspecified fall, initial encounter: Secondary | ICD-10-CM | POA: Insufficient documentation

## 2022-08-08 DIAGNOSIS — S3992XA Unspecified injury of lower back, initial encounter: Secondary | ICD-10-CM | POA: Diagnosis present

## 2022-08-08 DIAGNOSIS — S300XXA Contusion of lower back and pelvis, initial encounter: Secondary | ICD-10-CM | POA: Diagnosis not present

## 2022-08-08 DIAGNOSIS — Z7902 Long term (current) use of antithrombotics/antiplatelets: Secondary | ICD-10-CM | POA: Insufficient documentation

## 2022-08-08 DIAGNOSIS — I130 Hypertensive heart and chronic kidney disease with heart failure and stage 1 through stage 4 chronic kidney disease, or unspecified chronic kidney disease: Secondary | ICD-10-CM | POA: Diagnosis not present

## 2022-08-08 DIAGNOSIS — I5032 Chronic diastolic (congestive) heart failure: Secondary | ICD-10-CM | POA: Diagnosis not present

## 2022-08-08 MED ORDER — ACETAMINOPHEN 500 MG PO TABS
1000.0000 mg | ORAL_TABLET | Freq: Once | ORAL | Status: AC
  Administered 2022-08-08: 1000 mg via ORAL
  Filled 2022-08-08: qty 2

## 2022-08-08 NOTE — ED Triage Notes (Signed)
Family reports witnessed mechanical fall on Thursday. Pt fell onto R side/ hip. Wife states she is positive he did not hit his head when he fell. Pt is on plavix. Pt undressed into gown and ecchymosis noted to R buttock. No other bruising or obvious injury. Pt is able to stand and assist with moving but family stated he has had an increasingly difficulty time with movement and pain to the point of nausea. Baseline dementia. Family provides history. Pt oriented to person, place and time of day. He is able to provide some of his own history.

## 2022-08-08 NOTE — ED Provider Notes (Signed)
Emergency Department Provider Note   I have reviewed the triage vital signs and the nursing notes.   HISTORY  Chief Complaint Fall   HPI Ian Harrison is a 88 y.o. male with past history reviewed below including dementia, diabetes, and hypertension on aspirin/Plavix presents to the emergency department for evaluation of pain after fall.  Patient lives at home with his wife and had a witnessed fall 3 days prior.  The patient's wife states that he got up to go to the kitchen when she heard a fall and found him lying on his right side.  Family came to help him up off the floor and he has been ambulatory but complaining of soreness in his lower back and buttocks area.  Family noticed an area of bruising to the buttocks and this evening he had trouble getting up out of bed, presumably due to pain.  Patient does not recall the fall.  He tells me that in bed he is having no pain but when I ask him to sit up he complains of pain in his lower back.    Past Medical History:  Diagnosis Date   AAA (abdominal aortic aneurysm) without rupture ([place]) 09/03/2019   Acute lower UTI 07/10/2020   Aortic aneurysm (HCC)    CAD (coronary artery disease)    Cancer (HCC)    Skin cancer   Chest pain 07/10/2020   Chronic diastolic CHF (congestive heart failure) ([place]) 07/10/2020   Chronic kidney disease    Coronary artery disease involving native coronary artery of native heart without angina pectoris 09/03/2019   Dementia ([place])    Dementia without behavioral disturbance ([place]) 09/24/2019   Diabetes mellitus type II, non insulin dependent (HCC)    Dyslipidemia    Intolerance to atorvastatin due to myalgias   Elevated troponin    Essential hypertension 09/03/2019   Generalized weakness 07/10/2020   Hyperkalemia 07/10/2020   Myocardial infarction ([employer])    15 years ago   Vasovagal syncope    Without recurrence    Review of Systems  Constitutional: No fever/chills Musculoskeletal: Positive back pain.   ____________________________________________   PHYSICAL EXAM:  VITAL SIGNS: ED Triage Vitals  Enc Vitals Group     BP 08/08/22 0027 (!) 149/72     Pulse Rate 08/08/22 0027 85     Resp 08/08/22 0027 20     Temp 08/08/22 0027 98.3 F (36.8 C)     Temp Source 08/08/22 0027 Oral     SpO2 08/08/22 0027 98 %     Weight 08/08/22 0028 170 lb 11.2 oz (77.4 kg)     Height 08/08/22 0028 '5\' 11"'$  (1.803 m)   Constitutional: Alert with some confusion (baseline per daughter at bedside). Well appearing and in no acute distress. Eyes: Conjunctivae are normal.  Head: Atraumatic. Nose: No congestion/rhinnorhea. Mouth/Throat: Mucous membranes are moist.   Neck: No stridor.  No cervical spine tenderness to palpation. Cardiovascular: Normal rate, regular rhythm. Good peripheral circulation. Grossly normal heart sounds.   Respiratory: Normal respiratory effort.  No retractions. Lungs CTAB. Gastrointestinal: Soft and nontender. No distention.  Musculoskeletal: Normal range of motion of bilateral upper and lower extremities without discomfort.  No joint effusions or ecchymosis.  I do appreciate a 3 x 3 cm area of ecchymosis to the right superior gluteal area without significant underlying induration or obvious large hematoma. No midline spine tenderness in the thoracic or lumbar spine. No tenderness specifically in the L1 region.  Neurologic:  Normal speech and  language. No gross focal neurologic deficits are appreciated.  Skin:  Skin is warm, dry and intact. No rash noted. ____________________________________________  RADIOLOGY  CT Lumbar Spine Wo Contrast  Result Date: 08/08/2022 CLINICAL DATA:  Fall EXAM: CT LUMBAR SPINE WITHOUT CONTRAST TECHNIQUE: Multidetector CT imaging of the lumbar spine was performed without intravenous contrast administration. Multiplanar CT image reconstructions were also generated. RADIATION DOSE REDUCTION: This exam was performed according to the departmental  dose-optimization program which includes automated exposure control, adjustment of the mA and/or kV according to patient size and/or use of iterative reconstruction technique. COMPARISON:  CT abdomen/pelvis dated 07/10/2020 FINDINGS: Segmentation: 5 lumbar type vertebral bodies. Alignment: Normal lumbar lordosis. Vertebrae: Mild superior endplate compression fracture deformity at L1, with 10% loss of height centrally. No retropulsion. This is age indeterminate, favored to be chronic, but new from 2021. Vertebral body heights are otherwise maintained. Paraspinal and other soft tissues: Vascular calcifications. Colonic diverticulosis. Left renal sinus cyst. Disc levels: Moderate degenerative changes, most prominent at L2-3 and L3-4. Spinal canal is patent. IMPRESSION: Mild superior endplate compression fracture deformity at L1, as above. This is age indeterminate, favored to be chronic, but new from 2021. Otherwise, no traumatic injury to the cervical spine. Moderate degenerative changes. Electronically Signed   By: Julian Hy M.D.   On: 08/08/2022 01:23   CT Head Wo Contrast  Result Date: 08/08/2022 CLINICAL DATA:  Witnessed fall EXAM: CT HEAD WITHOUT CONTRAST CT CERVICAL SPINE WITHOUT CONTRAST TECHNIQUE: Multidetector CT imaging of the head and cervical spine was performed following the standard protocol without intravenous contrast. Multiplanar CT image reconstructions of the cervical spine were also generated. RADIATION DOSE REDUCTION: This exam was performed according to the departmental dose-optimization program which includes automated exposure control, adjustment of the mA and/or kV according to patient size and/or use of iterative reconstruction technique. COMPARISON:  None Available. FINDINGS: CT HEAD FINDINGS Brain: No evidence of acute infarction, hemorrhage, hydrocephalus, extra-axial collection or mass lesion/mass effect. Global cortical and central atrophy. Secondary mild ventricular  prominence. Subcortical white matter and periventricular small vessel ischemic changes. Vascular: Intracranial atherosclerosis Skull: Normal. Negative for fracture or focal lesion. Sinuses/Orbits: The visualized paranasal sinuses are essentially clear. The mastoid air cells are unopacified. Other: None. CT CERVICAL SPINE FINDINGS Alignment: Normal cervical lordosis. Skull base and vertebrae: No acute fracture. No primary bone lesion or focal pathologic process. Soft tissues and spinal canal: No prevertebral fluid or swelling. No visible canal hematoma. Disc levels: Mild degenerative changes of the mid cervical spine. Spinal canal is patent. Upper chest: Visualized lung apices are clear. Other: Visualized thyroid is unremarkable. IMPRESSION: No evidence of acute intracranial abnormality. Atrophy with small vessel ischemic changes. No traumatic injury to the cervical spine. Mild degenerative changes. Electronically Signed   By: Julian Hy M.D.   On: 08/08/2022 01:18   CT Cervical Spine Wo Contrast  Result Date: 08/08/2022 CLINICAL DATA:  Witnessed fall EXAM: CT HEAD WITHOUT CONTRAST CT CERVICAL SPINE WITHOUT CONTRAST TECHNIQUE: Multidetector CT imaging of the head and cervical spine was performed following the standard protocol without intravenous contrast. Multiplanar CT image reconstructions of the cervical spine were also generated. RADIATION DOSE REDUCTION: This exam was performed according to the departmental dose-optimization program which includes automated exposure control, adjustment of the mA and/or kV according to patient size and/or use of iterative reconstruction technique. COMPARISON:  None Available. FINDINGS: CT HEAD FINDINGS Brain: No evidence of acute infarction, hemorrhage, hydrocephalus, extra-axial collection or mass lesion/mass effect. Global cortical and central  atrophy. Secondary mild ventricular prominence. Subcortical white matter and periventricular small vessel ischemic changes.  Vascular: Intracranial atherosclerosis Skull: Normal. Negative for fracture or focal lesion. Sinuses/Orbits: The visualized paranasal sinuses are essentially clear. The mastoid air cells are unopacified. Other: None. CT CERVICAL SPINE FINDINGS Alignment: Normal cervical lordosis. Skull base and vertebrae: No acute fracture. No primary bone lesion or focal pathologic process. Soft tissues and spinal canal: No prevertebral fluid or swelling. No visible canal hematoma. Disc levels: Mild degenerative changes of the mid cervical spine. Spinal canal is patent. Upper chest: Visualized lung apices are clear. Other: Visualized thyroid is unremarkable. IMPRESSION: No evidence of acute intracranial abnormality. Atrophy with small vessel ischemic changes. No traumatic injury to the cervical spine. Mild degenerative changes. Electronically Signed   By: Julian Hy M.D.   On: 08/08/2022 01:18   DG Chest 1 View  Result Date: 08/08/2022 CLINICAL DATA:  Fall EXAM: CHEST  1 VIEW COMPARISON:  10/08/2020 FINDINGS: Heart and mediastinal contours are within normal limits. No focal opacities or effusions. No acute bony abnormality. Aortic calcifications and tortuosity. IMPRESSION: No active disease. Electronically Signed   By: Rolm Baptise M.D.   On: 08/08/2022 01:18   DG Pelvis 1-2 Views  Result Date: 08/08/2022 CLINICAL DATA:  Fall, right hip pain EXAM: PELVIS - 1-2 VIEW COMPARISON:  None Available. FINDINGS: There is no evidence of pelvic fracture or diastasis. No pelvic bone lesions are seen. Hip joints and SI joints symmetric. IMPRESSION: No acute bony abnormality. Electronically Signed   By: Rolm Baptise M.D.   On: 08/08/2022 01:18    ____________________________________________   PROCEDURES  Procedure(s) performed:   Procedures  None ____________________________________________   INITIAL IMPRESSION / ASSESSMENT AND PLAN / ED COURSE  Pertinent labs & imaging results that were available during my care  of the patient were reviewed by me and considered in my medical decision making (see chart for details).   This patient is Presenting for Evaluation of back pain after fall, which does require a range of treatment options, and is a complaint that involves a moderate risk of morbidity and mortality.  The Differential Diagnoses include fracture, contusion, spinal cord compression, pelvic fracture, ICH, c spine injury, etc.  Critical Interventions-    Medications  acetaminophen (TYLENOL) tablet 1,000 mg (1,000 mg Oral Given 08/08/22 0045)    Reassessment after intervention: Pain slightly improved.    I did obtain Additional Historical Information from wife and daughter at bedside.   Radiologic Tests Ordered, included CT head, c spine, and L spine along with CXR and pelvis x ray. I independently interpreted the images and agree with radiology interpretation.   Consult complete with Case mgmt for home health referral/orders.  Medical Decision Making: Summary:  Patient presents to the emergency department for evaluation of pain after fall several days ago.  Mild area of ecchymosis to the gluteal area without evidence of large or expanding hematoma on my exam.  He has normal range of motion of bilateral hips and is ambulatory without difficulty but mainly having pain with sitting up.  CT imaging of the lumbar spine shows possible and plate compression favored to be chronic.  He does not have focal pain in this area but discussed this with family that this could be the cause of pain but exam is less supportive of this.  Plan for scheduled Tylenol along with topical medications.  The patient's daughter will have family come to assist at the home who lives locally and I have  placed orders for home health/PT/OT with case management.  I do not have a case manager available at this hour but placed order for them to reach out tomorrow vs Monday. Family comfortable with plan at discharge.   Disposition:  discharge  ____________________________________________  FINAL CLINICAL IMPRESSION(S) / ED DIAGNOSES  Final diagnoses:  Fall, initial encounter  Acute bilateral low back pain without sciatica  Contusion of pelvic region, initial encounter    Note:  This document was prepared using Dragon voice recognition software and may include unintentional dictation errors.  Nanda Quinton, MD, [facility] Emergency Medicine    Helton Oleson, [facility], MD 08/08/22 [phone]

## 2022-08-08 NOTE — Telephone Encounter (Signed)
Order for [facility] PT, Ian Harrison accepted. Called and left message with wife to call back for details. Ian Harrison will call them to set up a time/day

## 2022-08-08 NOTE — Discharge Instructions (Signed)
You were seen in the emergency department today after a fall.  Your CT scans were overall reassuring.  You may take Tylenol every 6 hours but following the dosing instructions on the box.  I would take this regularly over at least the next couple of days to help reduce pain symptoms.  He may also use topical pain medicine such as Voltaren gel and/or over-the-counter lidocaine gel on the back to help with symptoms.  He may use this up to 4 times daily.  I have placed an order for a case manager to reach out to you, likely tomorrow, regarding home health orders.  They can help establish some home physical therapy and Occupational Therapy and assessment.  Please continue to follow closely with your primary care physician who can help extend this as needed and assist with further follow-up as needed.

## 2022-08-09 ENCOUNTER — Ambulatory Visit (INDEPENDENT_AMBULATORY_CARE_PROVIDER_SITE_OTHER): Payer: Medicare Other

## 2022-08-09 ENCOUNTER — Other Ambulatory Visit (HOSPITAL_BASED_OUTPATIENT_CLINIC_OR_DEPARTMENT_OTHER): Payer: Self-pay

## 2022-08-09 VITALS — Ht 71.0 in | Wt 170.0 lb

## 2022-08-09 DIAGNOSIS — Z0000 Encounter for general adult medical examination without abnormal findings: Secondary | ICD-10-CM

## 2022-08-09 NOTE — Patient Instructions (Signed)
Mr. Ian Harrison , Thank you for taking time to complete your Medicare Wellness Visit. I appreciate your ongoing commitment to your health goals. Please review the following plan we discussed and let me know if I can assist you in the future.   Screening recommendations/referrals: Colonoscopy: No longer required Recommended yearly ophthalmology/optometry visit for glaucoma screening and checkup Recommended yearly dental visit for hygiene and checkup  Vaccinations: Influenza vaccine: Due-May obtain vaccine at our office or your local pharmacy. Pneumococcal vaccine: Up to date Tdap vaccine: Due-May obtain vaccine at your local pharmacy. Shingles vaccine: Due-May obtain vaccine at your local pharmacy.   Covid-19: May obtain vaccine at your local pharmacy.   Advanced directives: Please bring a copy of Living Will and/or Healthcare Power of Attorney for your chart.   Conditions/risks identified: See problem list  Next appointment: Follow up in one year for your annual wellness visit.   Preventive Care 33 Years and Older, Male Preventive care refers to lifestyle choices and visits with your health care provider that can promote health and wellness. What does preventive care include? A yearly physical exam. This is also called an annual well check. Dental exams once or twice a year. Routine eye exams. Ask your health care provider how often you should have your eyes checked. Personal lifestyle choices, including: Daily care of your teeth and gums. Regular physical activity. Eating a healthy diet. Avoiding tobacco and drug use. Limiting alcohol use. Practicing safe sex. Taking low doses of aspirin every day. Taking vitamin and mineral supplements as recommended by your health care provider. What happens during an annual well check? The services and screenings done by your health care provider during your annual well check will depend on your age, overall health, lifestyle risk factors, and  family history of disease. Counseling  Your health care provider may ask you questions about your: Alcohol use. Tobacco use. Drug use. Emotional well-being. Home and relationship well-being. Sexual activity. Eating habits. History of falls. Memory and ability to understand (cognition). Work and work Statistician. Screening  You may have the following tests or measurements: Height, weight, and BMI. Blood pressure. Lipid and cholesterol levels. These may be checked every 5 years, or more frequently if you are over 11 years old. Skin check. Lung cancer screening. You may have this screening every year starting at age 58 if you have a 30-pack-year history of smoking and currently smoke or have quit within the past 15 years. Fecal occult blood test (FOBT) of the stool. You may have this test every year starting at age 46. Flexible sigmoidoscopy or colonoscopy. You may have a sigmoidoscopy every 5 years or a colonoscopy every 10 years starting at age 83. Prostate cancer screening. Recommendations will vary depending on your family history and other risks. Hepatitis C blood test. Hepatitis B blood test. Sexually transmitted disease (STD) testing. Diabetes screening. This is done by checking your blood sugar (glucose) after you have not eaten for a while (fasting). You may have this done every 1-3 years. Abdominal aortic aneurysm (AAA) screening. You may need this if you are a current or former smoker. Osteoporosis. You may be screened starting at age 47 if you are at high risk. Talk with your health care provider about your test results, treatment options, and if necessary, the need for more tests. Vaccines  Your health care provider may recommend certain vaccines, such as: Influenza vaccine. This is recommended every year. Tetanus, diphtheria, and acellular pertussis (Tdap, Td) vaccine. You may need a Td  booster every 10 years. Zoster vaccine. You may need this after age 52. Pneumococcal  13-valent conjugate (PCV13) vaccine. One dose is recommended after age 38. Pneumococcal polysaccharide (PPSV23) vaccine. One dose is recommended after age 38. Talk to your health care provider about which screenings and vaccines you need and how often you need them. This information is not intended to replace advice given to you by your health care provider. Make sure you discuss any questions you have with your health care provider. Document Released: 11/07/2015 Document Revised: 06/30/2016 Document Reviewed: 08/12/2015 Elsevier Interactive Patient Education  2017 [employer] Prevention in the Home Falls can cause injuries. They can happen to people of all ages. There are many things you can do to make your home safe and to help prevent falls. What can I do on the outside of my home? Regularly fix the edges of walkways and driveways and fix any cracks. Remove anything that might make you trip as you walk through a door, such as a raised step or threshold. Trim any bushes or trees on the path to your home. Use bright outdoor lighting. Clear any walking paths of anything that might make someone trip, such as rocks or tools. Regularly check to see if handrails are loose or broken. Make sure that both sides of any steps have handrails. Any raised decks and porches should have guardrails on the edges. Have any leaves, snow, or ice cleared regularly. Use sand or salt on walking paths during winter. Clean up any spills in your garage right away. This includes oil or grease spills. What can I do in the bathroom? Use night lights. Install grab bars by the toilet and in the tub and shower. Do not use towel bars as grab bars. Use non-skid mats or decals in the tub or shower. If you need to sit down in the shower, use a plastic, non-slip stool. Keep the floor dry. Clean up any water that spills on the floor as soon as it happens. Remove soap buildup in the tub or shower regularly. Attach  bath mats securely with double-sided non-slip rug tape. Do not have throw rugs and other things on the floor that can make you trip. What can I do in the bedroom? Use night lights. Make sure that you have a light by your bed that is easy to reach. Do not use any sheets or blankets that are too big for your bed. They should not hang down onto the floor. Have a firm chair that has side arms. You can use this for support while you get dressed. Do not have throw rugs and other things on the floor that can make you trip. What can I do in the kitchen? Clean up any spills right away. Avoid walking on wet floors. Keep items that you use a lot in easy-to-reach places. If you need to reach something above you, use a strong step stool that has a grab bar. Keep electrical cords out of the way. Do not use floor polish or wax that makes floors slippery. If you must use wax, use non-skid floor wax. Do not have throw rugs and other things on the floor that can make you trip. What can I do with my stairs? Do not leave any items on the stairs. Make sure that there are handrails on both sides of the stairs and use them. Fix handrails that are broken or loose. Make sure that handrails are as long as the stairways. Check any carpeting  to make sure that it is firmly attached to the stairs. Fix any carpet that is loose or worn. Avoid having throw rugs at the top or bottom of the stairs. If you do have throw rugs, attach them to the floor with carpet tape. Make sure that you have a light switch at the top of the stairs and the bottom of the stairs. If you do not have them, ask someone to add them for you. What else can I do to help prevent falls? Wear shoes that: Do not have high heels. Have rubber bottoms. Are comfortable and fit you well. Are closed at the toe. Do not wear sandals. If you use a stepladder: Make sure that it is fully opened. Do not climb a closed stepladder. Make sure that both sides of the  stepladder are locked into place. Ask someone to hold it for you, if possible. Clearly mark and make sure that you can see: Any grab bars or handrails. First and last steps. Where the edge of each step is. Use tools that help you move around (mobility aids) if they are needed. These include: Canes. Walkers. Scooters. Crutches. Turn on the lights when you go into a dark area. Replace any light bulbs as soon as they burn out. Set up your furniture so you have a clear path. Avoid moving your furniture around. If any of your floors are uneven, fix them. If there are any pets around you, be aware of where they are. Review your medicines with your doctor. Some medicines can make you feel dizzy. This can increase your chance of falling. Ask your doctor what other things that you can do to help prevent falls. This information is not intended to replace advice given to you by your health care provider. Make sure you discuss any questions you have with your health care provider. Document Released: 08/07/2009 Document Revised: 03/18/2016 Document Reviewed: 11/15/2014 Elsevier Interactive Patient Education  2017 [employer].

## 2022-08-09 NOTE — Progress Notes (Addendum)
Subjective:   Ian Harrison is a 88 y.o. male who presents for Medicare Annual/Subsequent preventive examination.  I connected with [place] today by telephone and verified that I am speaking with the correct person using two identifiers. Location patient: home Location provider: work Persons participating in the virtual visit: patient, wife, Marine scientist.    I discussed the limitations, risks, security and privacy concerns of performing an evaluation and management service by telephone and the availability of in person appointments. I also discussed with the patient that there may be a patient responsible charge related to this service. The patient expressed understanding and verbally consented to this telephonic visit.    Interactive audio and video telecommunications were attempted between this provider and patient, however failed, due to patient having technical difficulties OR patient did not have access to video capability.  We continued and completed visit with audio only.  Some vital signs may be absent or patient reported.   Time Spent with patient on telephone encounter: 30 minutes   Review of Systems     Cardiac Risk Factors include: advanced age (>40mn, >>71women);male gender;diabetes mellitus;dyslipidemia;hypertension     Objective:    Today's Vitals   08/09/22 0832  Weight: 170 lb (77.1 kg)  Height: '5\' 11"'$  (1.803 m)   Body mass index is 23.71 kg/m.     08/09/2022    8:36 AM 08/08/2022   12:30 AM 07/14/2021   10:23 AM 07/19/2020    3:29 AM 07/10/2020    9:55 AM 06/18/2020    1:20 PM 06/13/2020    7:42 AM  Advanced Directives  Does Patient Have a Medical Advance Directive? Yes Yes Yes No Yes Yes No;Yes  Type of [place]of [place] will Healthcare Power of [place] will Healthcare Power of [place]ara Cityof [place] will   Does patient want to make  changes to medical advance directive?    No - Patient declined No - Patient declined  No - Patient declined  Copy of [place] Chart? No - copy requested  No - copy requested No - copy requested No - copy requested No - copy requested   Would patient like information on creating a medical advance directive?    No - Patient declined       Current Medications (verified) Outpatient Encounter Medications as of 08/09/2022  Medication Sig   aspirin EC 81 MG tablet Take 81 mg by mouth daily with breakfast.    clopidogrel (PLAVIX) 75 MG tablet Take 1 tablet (75 mg total) by mouth daily.   nitroGLYCERIN (NITROSTAT) 0.4 MG SL tablet Place 1 tablet (0.4 mg total) under the tongue every 5 (five) minutes as needed for chest pain.   tamsulosin (FLOMAX) 0.4 MG CAPS capsule Take 1 capsule by mouth twice a day   ranolazine (RANEXA) 500 MG 12 hr tablet Take 1 tablet (500 mg total) by mouth 2 (two) times daily. (Patient not taking: Reported on 08/09/2022)   No facility-administered encounter medications on file as of 08/09/2022.    Allergies (verified) Atorvastatin and Morphine and related   History: Past Medical History:  Diagnosis Date   AAA (abdominal aortic aneurysm) without rupture [place] 09/03/2019   Acute lower UTI 07/10/2020   Aortic aneurysm ([facility]    CAD (coronary artery disease)    Cancer (HCC)    Skin cancer   Chest pain 07/10/2020   Chronic diastolic CHF (congestive heart failure) ([facility]  07/10/2020   Chronic kidney disease    Coronary artery disease involving native coronary artery of native heart without angina pectoris 09/03/2019   Dementia ([place])    Dementia without behavioral disturbance ([place]) 09/24/2019   Diabetes mellitus type II, non insulin dependent (HCC)    Dyslipidemia    Intolerance to atorvastatin due to myalgias   Elevated troponin    Essential hypertension 09/03/2019   Generalized weakness 07/10/2020   Hyperkalemia 07/10/2020   Myocardial infarction ([place])     15 years ago   Vasovagal syncope    Without recurrence   Past Surgical History:  Procedure Laterality Date   ABDOMINAL AORTIC ANEURYSM REPAIR     APPENDECTOMY     CATARACT EXTRACTION W/ INTRAOCULAR LENS IMPLANT     CYSTOSCOPY WITH FULGERATION N/A 06/23/2020   Procedure: CYSTOSCOPY WITH CLOT EVACUATION;  Surgeon: Robley Fries, MD;  Location: [facility];  Service: Urology;  Laterality: N/A;   HERNIA REPAIR     LEFT HEART CATH AND CORONARY ANGIOGRAPHY N/A 07/14/2020   Procedure: LEFT HEART CATH AND CORONARY ANGIOGRAPHY;  Surgeon: Burnell Blanks, MD;  Location: [place] [facility];  Service: Cardiovascular;  Laterality: N/A;   TRANSURETHRAL RESECTION OF BLADDER TUMOR N/A 06/23/2020   Procedure: TRANSURETHRAL RESECTION OF BLADDER TUMOR (TURBT);  Surgeon: Robley Fries, MD;  Location: [facility];  Service: Urology;  Laterality: N/A;  58 MINS   WISDOM TOOTH EXTRACTION     Family History  Problem Relation Age of Onset   Heart attack Brother    Social History   Socioeconomic History   Marital status: Married    Spouse name: Not on file   Number of children: Not on file   Years of education: Not on file   Highest education level: Not on file  Occupational History   Not on file  Tobacco Use   Smoking status: Former   Smokeless tobacco: Never   Tobacco comments:    Quit 17 years ago  Vaping Use   Vaping Use: Never used  Substance and Sexual Activity   Alcohol use: Not Currently   Drug use: Never   Sexual activity: Not on file  Other Topics Concern   Not on file  Social History Narrative   Not on file   Social Determinants of Health   Financial Resource Strain: Low Risk  (08/09/2022)   Overall Financial Resource Strain (CARDIA)    Difficulty of Paying Living Expenses: Not hard at all  Food Insecurity: No Food Insecurity (08/09/2022)   Hunger Vital Sign    Worried About Running Out of Food in the Last Year: Never true    [place] in the Last Year: Never true   Transportation Needs: No Transportation Needs (08/09/2022)   PRAPARE - Hydrologist (Medical): No    Lack of Transportation (Non-Medical): No  Physical Activity: Inactive (08/09/2022)   Exercise Vital Sign    Days of Exercise per Week: 0 days    Minutes of Exercise per Session: 0 min  Stress: No Stress Concern Present (07/14/2021)   [place]    Feeling of Stress : Not at all  Social Connections: Moderately Isolated (07/14/2021)   Social Connection and Isolation Panel [NHANES]    Frequency of Communication with Friends and Family: More than three times a week    Frequency of Social Gatherings with Friends and Family: More than three times a week  Attends Religious Services: Never    Active Member of Clubs or Organizations: No    Attends Archivist Meetings: Never    Marital Status: Married    Tobacco Counseling Counseling given: Not Answered Tobacco comments: Quit 17 years ago   Clinical Intake:  Pre-visit preparation completed: Yes  Pain : No/denies pain     BMI - recorded: 23.71 Nutritional Status: BMI of 19-24  Normal Nutritional Risks: None Diabetes: Yes CBG done?: No Did pt. bring in CBG monitor from home?: No  How often do you need to have someone help you when you read instructions, pamphlets, or other written materials from your doctor or pharmacy?: 1 - Never  Diabetic?Yes  Diabetes:  Is the patient diabetic?  Yes  If diabetic, was a CBG obtained today?  No  Did the patient bring in their glucometer from home?  No phone visit How often do you monitor your CBG's? never.   Financial Strains and Diabetes Management:  Are you having any financial strains with the device, your supplies or your medication? No .  Does the patient want to be seen by Chronic Care Management for management of their diabetes?  No  Would the patient like to be referred to a  Nutritionist or for Diabetic Management?  No   Diabetic Exams:  Diabetic Eye Exam: . Overdue for diabetic eye exam. Pt has been advised about the importance in completing this exam. A referral has been placed today. Message sent to referral coordinator for scheduling purposes. Advised pt to expect a call from our office re: appt.  Diabetic Foot Exam: Pt has been advised about the importance in completing this exam. To be completed by PCP.   Interpreter Needed?: No  Information entered by :: Caroleen Hamman LPN   Activities of Daily Living    08/09/2022    8:41 AM  In your present state of health, do you have any difficulty performing the following activities:  Hearing? 1  Vision? 0  Difficulty concentrating or making decisions? 1  Comment has a dx of Dementia  Walking or climbing stairs? 0  Dressing or bathing? 0  Doing errands, shopping? 1  Preparing Food and eating ? N  Using the Toilet? N  In the past six months, have you accidently leaked urine? N  Do you have problems with loss of bowel control? N  Managing your Medications? Y  Managing your Finances? Y  Housekeeping or managing your Housekeeping? N    Patient Care Team: Saguier, Iris Pert as PCP - General (Internal Medicine)  Indicate any recent Medical Services you may have received from other than [facility] providers in the past year (date may be approximate).     Assessment:   This is a routine wellness examination for Tonya.  Hearing/Vision screen Hearing Screening - Comments:: Bilateral hearing aids Vision Screening - Comments:: Last eye exam-02/2022-[facility]  Dietary issues and exercise activities discussed: Current Exercise Habits: The patient does not participate in regular exercise at present, Exercise limited by: None identified   Goals Addressed             This Visit's Progress    Patient Stated   On track    Maintain current health       Depression Screen    08/09/2022    8:41  AM 07/14/2021   10:26 AM  PHQ 2/9 Scores  PHQ - 2 Score  0  Exception Documentation Other- indicate reason in comment box  Fall Risk    08/09/2022    8:38 AM 07/14/2021   10:24 AM  Fall Risk   Falls in the past year? 1 0  Number falls in past yr: 0 0  Injury with Fall? 0 0  Risk for fall due to : History of fall(s)   Follow up Falls prevention discussed Falls prevention discussed    FALL RISK PREVENTION PERTAINING TO THE HOME:  Any stairs in or around the home? No  Home free of loose throw rugs in walkways, pet beds, electrical cords, etc? Yes  Adequate lighting in your home to reduce risk of falls? Yes   ASSISTIVE DEVICES UTILIZED TO PREVENT FALLS:  Life alert? No  Use of a cane, walker or w/c? No  Grab bars in the bathroom? Yes  Shower chair or bench in shower? Yes  Elevated toilet seat or a handicapped toilet? Yes   TIMED UP AND GO:  Was the test performed? No . Phone visit   Cognitive Function: Patient has current diagnosis of cognitive impairment. Patient is unable to complete screening 6CIT or MMSE.  Patient's wife answered questions for patient today.        Immunizations Immunization History  Administered Date(s) Administered   Fluad Quad(high Dose 65+) 10/03/2020, 08/07/2021   Influenza-Unspecified 07/06/2019, 08/05/2021   Pneumococcal Conjugate-13 08/04/2015   Pneumococcal Polysaccharide-23 10/25/2006    TDAP status: Due, Education has been provided regarding the importance of this vaccine. Advised may receive this vaccine at local pharmacy or Health Dept. Aware to provide a copy of the vaccination record if obtained from local pharmacy or Health Dept. Verbalized acceptance and understanding.  Flu Vaccine status: Due, Education has been provided regarding the importance of this vaccine. Advised may receive this vaccine at local pharmacy or Health Dept. Aware to provide a copy of the vaccination record if obtained from local pharmacy or Health Dept.  Verbalized acceptance and understanding.  Pneumococcal vaccine status: Up to date  Covid-19 vaccine status: Information provided on how to obtain vaccines.   Qualifies for Shingles Vaccine? Yes   Zostavax completed No   Shingrix Completed?: No.    Education has been provided regarding the importance of this vaccine. Patient has been advised to call insurance company to determine out of pocket expense if they have not yet received this vaccine. Advised may also receive vaccine at local pharmacy or Health Dept. Verbalized acceptance and understanding.  Screening Tests Health Maintenance  Topic Date Due   COVID-19 Vaccine (1) Never done   FOOT EXAM  Never done   OPHTHALMOLOGY EXAM  Never done   TETANUS/TDAP  Never done   Zoster Vaccines- Shingrix (1 of 2) Never done   INFLUENZA VACCINE  05/25/2022   HEMOGLOBIN A1C  10/08/2022   Pneumonia Vaccine 86+ Years old  Completed   HPV VACCINES  Aged Out    Health Maintenance  Health Maintenance Due  Topic Date Due   COVID-19 Vaccine (1) Never done   FOOT EXAM  Never done   OPHTHALMOLOGY EXAM  Never done   TETANUS/TDAP  Never done   Zoster Vaccines- Shingrix (1 of 2) Never done   INFLUENZA VACCINE  05/25/2022    Colorectal cancer screening: No longer required.   Lung Cancer Screening: (Low Dose CT Chest recommended if Age 77-80 years, 30 pack-year currently smoking OR have quit w/in 15years.) does not qualify.     Additional Screening:  Hepatitis C Screening: does not qualify  Vision Screening: Recommended annual ophthalmology exams for  early detection of glaucoma and other disorders of the eye. Is the patient up to date with their annual eye exam?  Yes  Who is the provider or what is the name of the office in which the patient attends annual eye exams? [facility]   Dental Screening: Recommended annual dental exams for proper oral hygiene  Community Resource Referral / Chronic Care Management: CRR required this visit?  No    CCM required this visit?  No      Plan:     I have personally reviewed and noted the following in the patient's chart:   Medical and social history Use of alcohol, tobacco or illicit drugs  Current medications and supplements including opioid prescriptions. Patient is not currently taking opioid prescriptions. Functional ability and status Nutritional status Physical activity Advanced directives List of other physicians Hospitalizations, surgeries, and ER visits in previous 12 months Vitals Screenings to include cognitive, depression, and falls Referrals and appointments  In addition, I have reviewed and discussed with patient certain preventive protocols, quality metrics, and best practice recommendations. A written personalized care plan for preventive services as well as general preventive health recommendations were provided to patient.   Due to this being a telephonic visit, the after visit summary with patients personalized plan was offered to patient via mail or my-chart. Patient would like to access on my-chart.   Marta Antu, LPN   [id number]  Nurse Health Advisor  Nurse Notes: None  Review and Agree with assessment & plan of LPN   Mackie Pai, PA-C

## 2022-08-10 ENCOUNTER — Other Ambulatory Visit (HOSPITAL_BASED_OUTPATIENT_CLINIC_OR_DEPARTMENT_OTHER): Payer: Self-pay

## 2022-08-11 ENCOUNTER — Other Ambulatory Visit (HOSPITAL_BASED_OUTPATIENT_CLINIC_OR_DEPARTMENT_OTHER): Payer: Self-pay

## 2022-08-11 MED ORDER — RANOLAZINE ER 500 MG PO TB12
500.0000 mg | ORAL_TABLET | Freq: Two times a day (BID) | ORAL | 0 refills | Status: DC
  Filled 2022-08-11: qty 60, 30d supply, fill #0

## 2022-08-12 ENCOUNTER — Telehealth: Payer: Self-pay

## 2022-08-12 NOTE — Telephone Encounter (Signed)
Received a call from Leanor Rubenstein, patients daughter. She states they have not heard fromhome health, [facility]. Cephus Slater from  [place] and gave him [name]' number per request, as her mother is hard of hearing.

## 2022-08-13 ENCOUNTER — Telehealth: Payer: Self-pay | Admitting: Medical

## 2022-08-13 ENCOUNTER — Other Ambulatory Visit (HOSPITAL_BASED_OUTPATIENT_CLINIC_OR_DEPARTMENT_OTHER): Payer: Self-pay

## 2022-08-13 NOTE — Telephone Encounter (Signed)
Pt fell the other night and fractured a vertebrae. When he was seen at the hospital they recommended he start taking ranexa again even though it causes constipation and recommended take pericolace to help with the constipation. They are needing pcp to fill the pericolace as the hospital only gave them 8.6 mg and they may need to up the dose. She stated he is not ready for a hfu and will need about another week to recover. She is hoping to get the pericolace today. She would like call once this is filled.     [facility] [address] [place], [place] [address] Phone: [phone]  Fax: [phone]

## 2022-08-13 NOTE — Telephone Encounter (Signed)
Spoke with patient's daughter stated she would pick up script today for parents

## 2022-08-17 ENCOUNTER — Telehealth: Payer: Self-pay | Admitting: Medical

## 2022-08-17 NOTE — Telephone Encounter (Signed)
VO given.

## 2022-08-17 NOTE — Telephone Encounter (Signed)
Caller/Agency: Antonietta Breach, [place] Number: [phone] Requesting OT/PT/Skilled Nursing/Social Work/Speech Therapy: OT and PT  Frequency: OT evaluation  PT 1x1, 2x3    Also therapist wanted to report skin tear on left leg and right patella

## 2022-08-24 NOTE — Telephone Encounter (Signed)
Wife stated its impossible for him to come in & he has caregivers around the clock   Wife stated you could give her a call her number is the [phone] # in the chart and also their daughter's number is In the chart as well

## 2022-08-24 NOTE — Telephone Encounter (Signed)
Beth Hopkins (physical therapist) called from [facility] stating the patient's wife wanted to cancel PT this week. Wife told her that on last week's session, the patient did too much and according to her an hour after physical therapy, his blood pressure fell and patient was out of it. Beth states that during the session, patient was chipper and in a good mood and walked a few feet and did some standing exercises. Beth did not believe the patient had strained himself and she is concerned it might be more of a medical things instead of the physical therapy. Please advise.   [employer] [phone]

## 2022-08-25 NOTE — Telephone Encounter (Signed)
They are not active on mychart

## 2022-08-26 ENCOUNTER — Telehealth: Payer: Self-pay | Admitting: Medical

## 2022-08-26 NOTE — Telephone Encounter (Signed)
Ian Harrison (spouse DPR OK) called stating that she was wanting to talk to Percell Miller about alternative plans of care for the pt. Pt is unable to come in for appt and she had stated that she would be ok with a telephone call if possible. Please Advise.

## 2022-08-27 NOTE — Telephone Encounter (Signed)
Appt scheduled 08/30/22

## 2022-08-30 ENCOUNTER — Other Ambulatory Visit (HOSPITAL_BASED_OUTPATIENT_CLINIC_OR_DEPARTMENT_OTHER): Payer: Self-pay

## 2022-08-30 ENCOUNTER — Telehealth (INDEPENDENT_AMBULATORY_CARE_PROVIDER_SITE_OTHER): Payer: Medicare Other | Admitting: Medical

## 2022-08-30 DIAGNOSIS — R531 Weakness: Secondary | ICD-10-CM | POA: Diagnosis not present

## 2022-08-30 DIAGNOSIS — I10 Essential (primary) hypertension: Secondary | ICD-10-CM

## 2022-08-30 DIAGNOSIS — F0390 Unspecified dementia without behavioral disturbance: Secondary | ICD-10-CM

## 2022-08-30 DIAGNOSIS — R739 Hyperglycemia, unspecified: Secondary | ICD-10-CM

## 2022-08-30 DIAGNOSIS — I2510 Atherosclerotic heart disease of native coronary artery without angina pectoris: Secondary | ICD-10-CM

## 2022-08-30 DIAGNOSIS — G4700 Insomnia, unspecified: Secondary | ICD-10-CM

## 2022-08-30 DIAGNOSIS — I5032 Chronic diastolic (congestive) heart failure: Secondary | ICD-10-CM | POA: Diagnosis not present

## 2022-08-30 MED ORDER — TRAZODONE HCL 50 MG PO TABS
25.0000 mg | ORAL_TABLET | Freq: Every evening | ORAL | 3 refills | Status: DC | PRN
  Filled 2022-08-30: qty 30, 30d supply, fill #0

## 2022-08-30 NOTE — Addendum Note (Signed)
Addended by: Anabel Halon on: 08/30/2022 03:05 PM   Modules accepted: Orders

## 2022-08-30 NOTE — Progress Notes (Signed)
Subjective:    Patient ID: Ian Harrison, male    DOB: 1927/04/07, 88 y.o.   MRN: [id number]  HPI  Virtual Visit via Telephone Note  I connected with Ian Harrison on 08/30/22 at  9:00 AM EST by telephone and verified that I am speaking with the correct person using two identifiers.  Location: Patient: home Provider: office   I discussed the limitations, risks, security and privacy concerns of performing an evaluation and management service by telephone and the availability of in person appointments. I also discussed with the patient that there may be a patient responsible charge related to this service. The patient expressed understanding and agreed to proceed.   History of Present Illness: Visit for follow up. Some time since I last saw him. He was seen in ED on 08-10-2022 and then again on 08-17-2022.     "08-10-2022.  Discharge Diagnoses: Principal Problem: Chest pain Active Problems: AAA (abdominal aortic aneurysm) without rupture (HCC) Chronic kidney disease Dementia (HCC) Diabetes mellitus type II, non insulin dependent (HCC) Essential hypertension Decreased activities of daily living (ADL) Resolved Problems: * No resolved hospital problems. *  Hospital Course: Ian Harrison is a 88 year old male with past medical history significant for hypertension, hyperlipidemia, CAD with history of myocardial infarction and stent x2, AAA without rupture, CHF, dementia, type 2 diabetes mellitus who presents to [facility] ED on 08/10/2022 complaining of 2 episodes of chest pain that started last night.  Per patient's wife who is at bedside, she gave patient a nitroglycerin after the first episode of chest pain which resolved his symptoms.  He then went to sleep and when he awoke he had another episode of chest pain.  At this time he received ASA and another SL nitro which resolved his symptoms.  He is not currently complaining of any chest pain.  Of note, patient reported to  have fallen at home approximately 2 days ago and landed on his buttocks and low back.   -Chest pain likely non-STEMI with elevated troponin troponin max of 87 History of stable angina History of MI with stents x2 Coronary disease, hypertension, hyperlipidemia. For now we will continue treatment with aspirin 81 mg p.o. daily and Plavix 75 mg p.o. daily. Ranexa 500 mg po bid Patient is not a candidate for nuclear med stress test due to confusion/dementia/advanced age. Patient was able to ambulate the hallways.     -Recent mechanical fall at home with acute subacute L1 superior endplate fracture this will manage supportively with Toradol, lidocaine patch and other pain medications. PT and OT evaluation noted patient will likely go home with home health     -Dementia. he was diagnosed with dementia approximately 10 years ago. Spouse helps take care of the patient. During this admission patient had episode of agitation and sundowning. This has since improved. Patient continues to be oriented to self and location at baseline. I was also informed the patient continues to drive. Which is not advisable for patient.   -Type 2 diabetes mellitus with hemoglobin A1c of Diabetic diet    Pt did have some chest pain weeks ago when he did PT. Some light headed sensation when this happened.   Some restlessness at night. Not sleeping well since the fall.  Pt has home health that comes out. Home health coming out 12-4 daytime. Also 9 pm-7 am in morning." Pt is being seen by [facility].   Pt never followed up with me until today.    08-17-2022.   Reviewed  ED note.   IMPRESSION: 1. Acute compression fracture of L1, with approximately 30% vertebral body height loss anteriorly and 5 mm retropulsion of the posterosuperior cortex. No significant spinal canal stenosis at this level. This could be further evaluated with MRI if clinically indicated. 2. No acute fracture or traumatic listhesis in the  thoracic spine. 3. For soft tissue findings, please see same-day CT chest abdomen pelvis.    Pt wife thinks tylenol is adequate enough for pain control. amb   Pt bp 112/52 8 pm.  Friday 130/59 and 117/58.    Pt states pain level almost non existent presently.  Observations/Objective: General- pleasant and alert. No acute distress. Normal voice.   Assessment and Plan:     Patient Instructions  [facility]- recently. Rx trazadone 25-50 mg q hs.  Recent likely non-stemi, elevated troponin and hx of CAD, hypertension and hyperlipidemia. Continue treatment with aspirin 81 mg p.o. daily and Plavix 75 mg p.o. daily. Ranexa 500 mg po bid.  Dementia- for 10 years.  Diabetes- controlled with diet.  Continue with Bayada.  Recent l1 fracture. Luckily not in much pain. Can continue just tylenol.  With age, recent events, medical history and frail condition do think best to initiate palliative care. If they don't contact you be Wednesday let me know.  Follow up date to be determined after palliative care consult.     Total time spent with patient and wife 36 minutes.Discussing recent ED visit and plan going foward Follow Up Instructions:    I discussed the assessment and treatment plan with the patient. The patient was provided an opportunity to ask questions and all were answered. The patient agreed with the plan and demonstrated an understanding of the instructions.   The patient was advised to call back or seek an in-person evaluation if the symptoms worsen or if the condition fails to improve as anticipated.     Mackie Pai, PA-C    Review of Systems     Objective:   Physical Exam        Assessment & Plan:

## 2022-08-30 NOTE — Patient Instructions (Addendum)
[facility]- recently. Rx trazadone 25-50 mg q hs.  Recent likely non-stemi, elevated troponin and hx of CAD, hypertension and hyperlipidemia. Continue treatment with aspirin 81 mg p.o. daily and Plavix 75 mg p.o. daily. Ranexa 500 mg po bid.  Dementia- for 10 years.  Diabetes- controlled with diet.  Continue with Bayada.  Recent l1 fracture. Luckily not in much pain. Can continue just tylenol.  With age, recent events, medical history and frail condition do think best to initiate palliative care. If they don't contact you be Wednesday let me know.  Follow up date to be determined after palliative care consult.

## 2022-08-30 NOTE — Telephone Encounter (Signed)
Stacy from [facility], called to make [facility] aware that in order for the patient to be eligable for palliative care he would either need a cancer diagnosis or the will be needing hospice within 2-3 months. If he does not meet those requirements, he would have to be discharged. She can be reached at [phone] option 2 for further questions. Please advise.

## 2022-08-31 NOTE — Telephone Encounter (Signed)
Spoke with Marzetta Board and made her aware of palliative care choice and stated she will fax over order form as well

## 2022-08-31 NOTE — Telephone Encounter (Signed)
Lvm for agent to call back

## 2022-09-01 ENCOUNTER — Other Ambulatory Visit: Payer: Medicare Other | Admitting: Nurse Practitioner

## 2022-09-01 ENCOUNTER — Telehealth: Payer: Self-pay

## 2022-09-01 ENCOUNTER — Encounter: Payer: Self-pay | Admitting: Nurse Practitioner

## 2022-09-01 DIAGNOSIS — Z515 Encounter for palliative care: Secondary | ICD-10-CM

## 2022-09-01 DIAGNOSIS — R52 Pain, unspecified: Secondary | ICD-10-CM

## 2022-09-01 DIAGNOSIS — S32010S Wedge compression fracture of first lumbar vertebra, sequela: Secondary | ICD-10-CM

## 2022-09-01 NOTE — Telephone Encounter (Signed)
Spoke with patient's spouse [name] and scheduled a telephonic Palliative Consult for 09/01/22 @ 2 PM.   Consent obtained; updated Netsmart, Team List and Epic.

## 2022-09-01 NOTE — Progress Notes (Signed)
[facility] Consult Note Telephone: [phone]  Fax: [phone]   Date of encounter: 09/01/22 11:17 PM PATIENT NAME: Ian Harrison [address] [place] [address]   [phone] (home)  DOB: Feb 20, 1927 MRN: [id number] PRIMARY CARE PROVIDER:    [facility] [address] [place] [address] [phone]  REFERRING PROVIDER:   Elise Benne [address] [place],  [place] [address] [phone]  RESPONSIBLE PARTY:    Contact Information     Name Relation Home Work Mobile   Victory,Diana Spouse [phone]  [phone]   Habig,Chris Daughter [phone]  [phone]      Due to the COVID-19 crisis, this visit was done via telemedicine from my office and it was initiated and consent by this patient and or family.  I connected with Mrs Delis with  Audwin Semper [facility] on 09/01/22 by telephone as video not available enabled telemedicine application and verified that I am speaking with the correct person using two identifiers.   I discussed the limitations of evaluation and management by telemedicine. The patient expressed understanding and agreed to proceed.  Palliative Care was asked to follow this patient by consultation request of  [place], Iris Pert to address advance care planning and complex medical decision making. This is the initial visit.             ASSESSMENT AND PLAN / RECOMMENDATIONS:  Symptom Management/Plan: 1. Advance Care Planning;  ongoing discussions 2. Goals of Care: Goals include to maximize quality of life and symptom management. Our advance care planning conversation included a discussion about:    The value and importance of advance care planning  Exploration of personal, cultural or spiritual beliefs that might influence medical decisions  Exploration of goals of care in the event of a sudden injury or illness  Identification  and preparation of a healthcare agent  Review and updating or creation of an advance directive document.  3. Pain secondary to compression fracture (Acute compression fracture of L1) reviewed pain which has been managed, functional abilities slowly returning to baseline. Continue to monitor pain on pain scale, continue current regimen.  4. Palliative care encounter; Palliative care encounter; Palliative medicine team will continue to support patient, patient's family, and medical team. Visit consisted of counseling and education dealing with the complex and emotionally intense issues of symptom management and palliative care in the setting of serious and potentially life-threatening illness I spent 31 minutes providing this consultation. More than 50% of the time in this consultation was spent in counseling and care coordination. PPS: 50%  Chief Complaint: Initial palliative consult for complex medical decision making, address goals, manage ongoing symptoms  HISTORY OF PRESENT ILLNESS:  Ian Harrison is a 88 y.o. year old male  with multiple medical problems including T2DM, CKD, CHF, AAA, HTN, MI, Dementia.   History obtained from review of EMR, discussion with Mrs with Mr. Arrona.  I reviewed available labs, medications, imaging, studies and related documents from the EMR.  Records reviewed and summarized above.   ROS 10 point system reviewed all negative except HPI  Physical Exam: deferred CURRENT PROBLEM LIST:  Patient Active Problem List   Diagnosis Date Noted   Stable angina pectoris 02/01/2022   Vasovagal syncope    Myocardial infarction (HCC)    Dementia (HCC)    Cancer (HCC)    CAD (coronary artery disease)    Aortic aneurysm ([place])  Elevated troponin    Generalized weakness 07/10/2020   Acute lower UTI 07/10/2020   Chest pain 07/10/2020   Hyperkalemia 07/10/2020   Chronic diastolic CHF (congestive heart failure) ([place]) 07/10/2020   Diabetes mellitus type II, non  insulin dependent ([place])    Chronic kidney disease    Dementia without behavioral disturbance ([place]) 09/24/2019   Coronary artery disease involving native coronary artery of native heart without angina pectoris 09/03/2019   Dyslipidemia 09/03/2019   Essential hypertension 09/03/2019   AAA (abdominal aortic aneurysm) without rupture ([place]) 09/03/2019   PAST MEDICAL HISTORY:  Active Ambulatory Problems    Diagnosis Date Noted   Coronary artery disease involving native coronary artery of native heart without angina pectoris 09/03/2019   Dyslipidemia 09/03/2019   Essential hypertension 09/03/2019   AAA (abdominal aortic aneurysm) without rupture ([place]) 09/03/2019   Dementia without behavioral disturbance ([place]) 09/24/2019   Generalized weakness 07/10/2020   Acute lower UTI 07/10/2020   Chest pain 07/10/2020   Hyperkalemia 07/10/2020   Diabetes mellitus type II, non insulin dependent (HCC)    Chronic kidney disease    Chronic diastolic CHF (congestive heart failure) ([place]) 07/10/2020   Elevated troponin    Vasovagal syncope    Myocardial infarction ([place])    Dementia ([place])    Cancer (HCC)    CAD (coronary artery disease)    Aortic aneurysm (HCC)    Stable angina pectoris 02/01/2022   Resolved Ambulatory Problems    Diagnosis Date Noted   No Resolved Ambulatory Problems   No Additional Past Medical History   SOCIAL HX:  Social History   Tobacco Use   Smoking status: Former   Smokeless tobacco: Never   Tobacco comments:    Quit 17 years ago  Substance Use Topics   Alcohol use: Not Currently   FAMILY HX:  Family History  Problem Relation Age of Onset   Heart attack Brother       ALLERGIES:  Allergies  Allergen Reactions   Atorvastatin Other (See Comments)    myalgia   Morphine And Related Other (See Comments)    Mood changes (anxious)     PERTINENT MEDICATIONS:  Outpatient Encounter Medications as of 09/01/2022  Medication Sig   aspirin EC 81 MG tablet Take 81 mg by  mouth daily with breakfast.    clopidogrel (PLAVIX) 75 MG tablet Take 1 tablet (75 mg total) by mouth daily.   nitroGLYCERIN (NITROSTAT) 0.4 MG SL tablet Place 1 tablet (0.4 mg total) under the tongue every 5 (five) minutes as needed for chest pain.   ranolazine (RANEXA) 500 MG 12 hr tablet Take 1 tablet (500 mg total) by mouth 2 (two) times daily. (Patient not taking: Reported on 08/09/2022)   ranolazine (RANEXA) 500 MG 12 hr tablet Take 1 tablet (500 mg total) by mouth 2 (two) times daily.   tamsulosin (FLOMAX) 0.4 MG CAPS capsule Take 1 capsule by mouth twice a day   traZODone (DESYREL) 50 MG tablet Take 0.5-1 tablets (25-50 mg total) by mouth at bedtime as needed for sleep.   No facility-administered encounter medications on file as of 09/01/2022.   Thank you for the opportunity to participate in the care of Mr. Harrison.  The palliative care team will continue to follow. Please call our office at [phone] if we can be of additional assistance.   Iridiana Fonner Z Lyle Niblett, NP ,

## 2022-09-02 ENCOUNTER — Telehealth: Payer: Self-pay | Admitting: *Deleted

## 2022-09-02 NOTE — Telephone Encounter (Signed)
Call Type Triage / Clinical Caller Name [name] Relationship To Patient Spouse Return Phone Number [phone] (Primary) Chief Complaint Walking difficulty Reason for Call Symptomatic / Request for [place] states her husband is experiencing dizziness with difficulty walking. Translation No Nurse Assessment Nurse: Rolin Barry, RN, [place] Dy Date/Time Eilene Ghazi Time): 09/02/2022 10:11:21 AM Confirm and document reason for call. If symptomatic, describe symptoms. ---Caller states her husband is experiencing dizziness with difficulty walking. Caller advised that his B/P is 84/36 HR 84.   Final Disposition 09/02/2022 10:15:32 AM Call EMS 911 Now Yes [place], RN, Cindee Lame Disagree/Comply Comply Caller Understands Yes PreDisposition Did not know what to do Care Advice Given Per Guideline CALL EMS 911 NOW: * Immediate medical attention is needed. You need to hang up and call 911 (or an ambulance). * [facility] Discretion: I'll call you back in a few minutes to be sure you were able to reach them. CARE ADVICE given per Low Blood Pressure (Adult) guideline. Comments User: Saverio Danker, RN Date/Time Eilene Ghazi Time): 09/02/2022 10:17:44 AM Caller advised that she gave him a nitro about an hour ago. I asked her why she gave the nitro and she said she thought he was having pain from straining to use the bathroom. Caller is in chair, unable to stand, B/P 84/36, HR 84. Caller advised to call 911. Caller unsure if she can safely get him to the bed by herself from the chair, advised to let him set in the chair

## 2022-09-09 ENCOUNTER — Other Ambulatory Visit (HOSPITAL_BASED_OUTPATIENT_CLINIC_OR_DEPARTMENT_OTHER): Payer: Self-pay

## 2022-09-09 DIAGNOSIS — Z789 Other specified health status: Secondary | ICD-10-CM

## 2022-09-09 HISTORY — DX: Other specified health status: Z78.9

## 2022-09-13 ENCOUNTER — Ambulatory Visit: Payer: Medicare Other | Admitting: Cardiology

## 2022-10-21 ENCOUNTER — Other Ambulatory Visit (HOSPITAL_BASED_OUTPATIENT_CLINIC_OR_DEPARTMENT_OTHER): Payer: Self-pay

## 2022-11-16 ENCOUNTER — Encounter: Payer: Self-pay | Admitting: Cardiology

## 2022-11-16 ENCOUNTER — Ambulatory Visit: Payer: Medicare Other | Attending: Cardiology | Admitting: Cardiology

## 2022-11-16 VITALS — BP 122/62 | HR 86 | Ht 71.0 in | Wt 162.1 lb

## 2022-11-16 DIAGNOSIS — I10 Essential (primary) hypertension: Secondary | ICD-10-CM | POA: Diagnosis not present

## 2022-11-16 DIAGNOSIS — I2510 Atherosclerotic heart disease of native coronary artery without angina pectoris: Secondary | ICD-10-CM | POA: Diagnosis not present

## 2022-11-16 DIAGNOSIS — E119 Type 2 diabetes mellitus without complications: Secondary | ICD-10-CM

## 2022-11-16 NOTE — Patient Instructions (Signed)

## 2022-11-16 NOTE — Progress Notes (Signed)
Cardiology Office Note:    Date:  11/16/2022   ID:  Ian Harrison, DOB Jul 04, 1927, MRN [id number]  PCP:  Mackie Pai, PA-C  Cardiologist:  Jenean Lindau, MD   Referring MD: Mackie Pai, PA-C    ASSESSMENT:    1. Coronary artery disease involving native coronary artery of native heart without angina pectoris   2. Essential hypertension   3. Diabetes mellitus type II, non insulin dependent (HCC)    PLAN:    In order of problems listed above:  Coronary artery disease: Secondary prevention stressed to the patient.  Importance of compliance with diet medication stressed and vocalized understanding.  He ambulates age appropriately. Stable angina pectoris: No complaints at this time.  Occasionally about once or twice in a month  he and his wife mentioned to me that he uses sublingual nitroglycerin. Because of stable angina and the fact that he has episodes of chest pain once or twice a month I will let him continue dual antiplatelet therapy per his wishes.  I think it is probably better for him to do that to have his symptoms escalated.  Benefits risks explained. Patient will be seen in follow-up appointment in 6 months or earlier if the patient has any concerns    Medication Adjustments/Labs and Tests Ordered: Current medicines are reviewed at length with the patient today.  Concerns regarding medicines are outlined above.  No orders of the defined types were placed in this encounter.  No orders of the defined types were placed in this encounter.    No chief complaint on file.    History of Present Illness:    Ian Harrison is a 89 y.o. male.  Patient has past medical history of coronary artery disease, essential hypertension, mixed dyslipidemia.  He denies any problems at this time and takes care of activities of daily living.  No chest pain orthopnea or PND.  At the time of my evaluation, the patient is alert awake oriented and in no distress.  Past Medical  History:  Diagnosis Date   AAA (abdominal aortic aneurysm) without rupture ([place]) 09/03/2019   Acute lower UTI 07/10/2020   Aortic aneurysm [facility])    Arteriosclerosis of coronary artery 09/03/2019   CAD (coronary artery disease)    Cancer (HCC)    Skin cancer   Chest pain 07/10/2020   Chronic diastolic CHF (congestive heart failure) ([place]) 07/10/2020   Chronic kidney disease    Coronary artery disease involving native coronary artery of native heart without angina pectoris 09/03/2019   Decreased activities of daily living (ADL) 09/09/2022   Dementia ([place])    Dementia without behavioral disturbance ([place]) 09/24/2019   Diabetes mellitus type II, non insulin dependent (HCC)    Dyslipidemia    Intolerance to atorvastatin due to myalgias   Elevated troponin    Essential hypertension 09/03/2019   Generalized weakness 07/10/2020   Hyperkalemia 07/10/2020   Myocardial infarction ([place])    15 years ago   Stable angina pectoris 02/01/2022   Vasovagal syncope    Without recurrence    Past Surgical History:  Procedure Laterality Date   ABDOMINAL AORTIC ANEURYSM REPAIR     APPENDECTOMY     CATARACT EXTRACTION W/ INTRAOCULAR LENS IMPLANT     CYSTOSCOPY WITH FULGERATION N/A 06/23/2020   Procedure: CYSTOSCOPY WITH CLOT EVACUATION;  Surgeon: Robley Fries, MD;  Location: [facility];  Service: Urology;  Laterality: N/A;   HERNIA REPAIR     LEFT HEART CATH AND CORONARY ANGIOGRAPHY N/A  07/14/2020   Procedure: LEFT HEART CATH AND CORONARY ANGIOGRAPHY;  Surgeon: Burnell Blanks, MD;  Location: [place] [facility];  Service: Cardiovascular;  Laterality: N/A;   TRANSURETHRAL RESECTION OF BLADDER TUMOR N/A 06/23/2020   Procedure: TRANSURETHRAL RESECTION OF BLADDER TUMOR (TURBT);  Surgeon: Robley Fries, MD;  Location: [facility];  Service: Urology;  Laterality: N/A;  90 MINS   WISDOM TOOTH EXTRACTION      Current Medications: Current Meds  Medication Sig   aspirin EC 81 MG tablet Take 81 mg by  mouth daily with breakfast.    clopidogrel (PLAVIX) 75 MG tablet Take 1 tablet (75 mg total) by mouth daily.   nitroGLYCERIN (NITROSTAT) 0.4 MG SL tablet Place 1 tablet (0.4 mg total) under the tongue every 5 (five) minutes as needed for chest pain.   tamsulosin (FLOMAX) 0.4 MG CAPS capsule Take 1 capsule by mouth twice a day   traZODone (DESYREL) 50 MG tablet Take 0.5-1 tablets (25-50 mg total) by mouth at bedtime as needed for sleep.     Allergies:   Atorvastatin and Morphine and related   Social History   Socioeconomic History   Marital status: Married    Spouse name: Not on file   Number of children: Not on file   Years of education: Not on file   Highest education level: Not on file  Occupational History   Not on file  Tobacco Use   Smoking status: Former   Smokeless tobacco: Never   Tobacco comments:    Quit 17 years ago  Vaping Use   Vaping Use: Never used  Substance and Sexual Activity   Alcohol use: Not Currently   Drug use: Never   Sexual activity: Not on file  Other Topics Concern   Not on file  Social History Narrative   Not on file   Social Determinants of Health   Financial Resource Strain: Low Risk  (08/09/2022)   Overall Financial Resource Strain (CARDIA)    Difficulty of Paying Living Expenses: Not hard at all  Food Insecurity: No Food Insecurity (08/09/2022)   Hunger Vital Sign    Worried About Running Out of Food in the Last Year: Never true    [place] in the Last Year: Never true  Transportation Needs: No Transportation Needs (08/09/2022)   PRAPARE - Hydrologist (Medical): No    Lack of Transportation (Non-Medical): No  Physical Activity: Inactive (08/09/2022)   Exercise Vital Sign    Days of Exercise per Week: 0 days    Minutes of Exercise per Session: 0 min  Stress: No Stress Concern Present (07/14/2021)   [place]    Feeling of Stress :  Not at all  Social Connections: Moderately Isolated (07/14/2021)   Social Connection and Isolation Panel [NHANES]    Frequency of Communication with Friends and Family: More than three times a week    Frequency of Social Gatherings with Friends and Family: More than three times a week    Attends Religious Services: Never    Marine scientist or Organizations: No    Attends Archivist Meetings: Never    Marital Status: Married     Family History: The patient's family history includes Heart attack in his brother.  ROS:   Please see the history of present illness.    All other systems reviewed and are negative.  EKGs/Labs/Other Studies Reviewed:  The following studies were reviewed today: I discussed my findings with the patient at length   Recent Labs: 04/08/2022: ALT 9; BUN 17; Creatinine, Ser 0.95; Hemoglobin 11.5; Platelets 140.0; Potassium 4.7; Sodium 142  Recent Lipid Panel    Component Value Date/Time   CHOL 129 04/08/2022 0709   TRIG 59.0 04/08/2022 0709   HDL 51.40 04/08/2022 0709   CHOLHDL 3 04/08/2022 0709   VLDL 11.8 04/08/2022 0709   LDLCALC 66 04/08/2022 0709    Physical Exam:    VS:  BP 122/62   Pulse 86   Ht '5\' 11"'$  (1.803 m)   Wt 162 lb 1.3 oz (73.5 kg)   SpO2 92%   BMI 22.61 kg/m     Wt Readings from Last 3 Encounters:  11/16/22 162 lb 1.3 oz (73.5 kg)  08/09/22 170 lb (77.1 kg)  08/08/22 170 lb 11.2 oz (77.4 kg)     GEN: Patient is in no acute distress HEENT: Normal NECK: No JVD; No carotid bruits LYMPHATICS: No lymphadenopathy CARDIAC: Hear sounds regular, 2/6 systolic murmur at the apex. RESPIRATORY:  Clear to auscultation without rales, wheezing or rhonchi  ABDOMEN: Soft, non-tender, non-distended MUSCULOSKELETAL:  No edema; No deformity  SKIN: Warm and dry NEUROLOGIC:  Alert and oriented x 3 PSYCHIATRIC:  Normal affect   Signed, Jenean Lindau, MD  11/16/2022 3:39 PM    [place]

## 2022-12-07 ENCOUNTER — Other Ambulatory Visit (HOSPITAL_BASED_OUTPATIENT_CLINIC_OR_DEPARTMENT_OTHER): Payer: Self-pay

## 2023-01-13 ENCOUNTER — Other Ambulatory Visit (HOSPITAL_BASED_OUTPATIENT_CLINIC_OR_DEPARTMENT_OTHER): Payer: Self-pay

## 2023-01-13 MED ORDER — TAMSULOSIN HCL 0.4 MG PO CAPS
0.4000 mg | ORAL_CAPSULE | Freq: Two times a day (BID) | ORAL | 3 refills | Status: DC
  Filled 2023-01-13: qty 60, 30d supply, fill #0

## 2023-01-21 ENCOUNTER — Other Ambulatory Visit (HOSPITAL_BASED_OUTPATIENT_CLINIC_OR_DEPARTMENT_OTHER): Payer: Self-pay

## 2023-01-24 ENCOUNTER — Telehealth: Payer: Self-pay | Admitting: Medical

## 2023-01-24 NOTE — Telephone Encounter (Signed)
Ebony Hail from [facility] stated pt's family is requesting palliative care. [phone]

## 2023-01-25 ENCOUNTER — Other Ambulatory Visit: Payer: Self-pay

## 2023-01-25 ENCOUNTER — Encounter (HOSPITAL_BASED_OUTPATIENT_CLINIC_OR_DEPARTMENT_OTHER): Payer: Self-pay

## 2023-01-25 ENCOUNTER — Emergency Department (HOSPITAL_BASED_OUTPATIENT_CLINIC_OR_DEPARTMENT_OTHER): Payer: Medicare Other

## 2023-01-25 ENCOUNTER — Inpatient Hospital Stay (HOSPITAL_BASED_OUTPATIENT_CLINIC_OR_DEPARTMENT_OTHER)
Admission: EM | Admit: 2023-01-25 | Discharge: 2023-01-28 | DRG: 280 | Disposition: A | Discharge status: Hospice | Payer: Medicare Other | Attending: Family Medicine | Admitting: Family Medicine

## 2023-01-25 DIAGNOSIS — I21A1 Myocardial infarction type 2: Secondary | ICD-10-CM | POA: Diagnosis present

## 2023-01-25 DIAGNOSIS — R0609 Other forms of dyspnea: Secondary | ICD-10-CM

## 2023-01-25 DIAGNOSIS — Z7189 Other specified counseling: Secondary | ICD-10-CM | POA: Diagnosis not present

## 2023-01-25 DIAGNOSIS — Z8249 Family history of ischemic heart disease and other diseases of the circulatory system: Secondary | ICD-10-CM | POA: Diagnosis not present

## 2023-01-25 DIAGNOSIS — N189 Chronic kidney disease, unspecified: Secondary | ICD-10-CM | POA: Diagnosis present

## 2023-01-25 DIAGNOSIS — E871 Hypo-osmolality and hyponatremia: Secondary | ICD-10-CM | POA: Diagnosis present

## 2023-01-25 DIAGNOSIS — Z66 Do not resuscitate: Secondary | ICD-10-CM | POA: Diagnosis present

## 2023-01-25 DIAGNOSIS — I5043 Acute on chronic combined systolic (congestive) and diastolic (congestive) heart failure: Principal | ICD-10-CM | POA: Diagnosis present

## 2023-01-25 DIAGNOSIS — I2510 Atherosclerotic heart disease of native coronary artery without angina pectoris: Secondary | ICD-10-CM | POA: Diagnosis present

## 2023-01-25 DIAGNOSIS — I214 Non-ST elevation (NSTEMI) myocardial infarction: Secondary | ICD-10-CM

## 2023-01-25 DIAGNOSIS — R0602 Shortness of breath: Secondary | ICD-10-CM | POA: Diagnosis not present

## 2023-01-25 DIAGNOSIS — Z79899 Other long term (current) drug therapy: Secondary | ICD-10-CM | POA: Diagnosis not present

## 2023-01-25 DIAGNOSIS — J90 Pleural effusion, not elsewhere classified: Secondary | ICD-10-CM | POA: Diagnosis not present

## 2023-01-25 DIAGNOSIS — F03918 Unspecified dementia, unspecified severity, with other behavioral disturbance: Secondary | ICD-10-CM | POA: Diagnosis not present

## 2023-01-25 DIAGNOSIS — I130 Hypertensive heart and chronic kidney disease with heart failure and stage 1 through stage 4 chronic kidney disease, or unspecified chronic kidney disease: Secondary | ICD-10-CM | POA: Diagnosis not present

## 2023-01-25 DIAGNOSIS — Z87891 Personal history of nicotine dependence: Secondary | ICD-10-CM

## 2023-01-25 DIAGNOSIS — I252 Old myocardial infarction: Secondary | ICD-10-CM

## 2023-01-25 DIAGNOSIS — Z7902 Long term (current) use of antithrombotics/antiplatelets: Secondary | ICD-10-CM

## 2023-01-25 DIAGNOSIS — Z7982 Long term (current) use of aspirin: Secondary | ICD-10-CM

## 2023-01-25 DIAGNOSIS — I509 Heart failure, unspecified: Secondary | ICD-10-CM | POA: Diagnosis not present

## 2023-01-25 DIAGNOSIS — D649 Anemia, unspecified: Secondary | ICD-10-CM | POA: Diagnosis not present

## 2023-01-25 DIAGNOSIS — R7989 Other specified abnormal findings of blood chemistry: Secondary | ICD-10-CM

## 2023-01-25 DIAGNOSIS — E1122 Type 2 diabetes mellitus with diabetic chronic kidney disease: Secondary | ICD-10-CM | POA: Diagnosis not present

## 2023-01-25 DIAGNOSIS — Z888 Allergy status to other drugs, medicaments and biological substances status: Secondary | ICD-10-CM

## 2023-01-25 DIAGNOSIS — E785 Hyperlipidemia, unspecified: Secondary | ICD-10-CM | POA: Diagnosis present

## 2023-01-25 DIAGNOSIS — Z515 Encounter for palliative care: Secondary | ICD-10-CM | POA: Diagnosis not present

## 2023-01-25 DIAGNOSIS — I5033 Acute on chronic diastolic (congestive) heart failure: Secondary | ICD-10-CM

## 2023-01-25 DIAGNOSIS — Z9861 Coronary angioplasty status: Secondary | ICD-10-CM | POA: Diagnosis not present

## 2023-01-25 DIAGNOSIS — Z85828 Personal history of other malignant neoplasm of skin: Secondary | ICD-10-CM

## 2023-01-25 DIAGNOSIS — D539 Nutritional anemia, unspecified: Secondary | ICD-10-CM | POA: Diagnosis present

## 2023-01-25 DIAGNOSIS — R0902 Hypoxemia: Secondary | ICD-10-CM | POA: Diagnosis not present

## 2023-01-25 DIAGNOSIS — N400 Enlarged prostate without lower urinary tract symptoms: Secondary | ICD-10-CM | POA: Diagnosis present

## 2023-01-25 DIAGNOSIS — Z885 Allergy status to narcotic agent status: Secondary | ICD-10-CM

## 2023-01-25 DIAGNOSIS — I110 Hypertensive heart disease with heart failure: Secondary | ICD-10-CM | POA: Diagnosis not present

## 2023-01-25 DIAGNOSIS — F03911 Unspecified dementia, unspecified severity, with agitation: Secondary | ICD-10-CM | POA: Diagnosis not present

## 2023-01-25 LAB — CBC
HCT: 25.3 % — ABNORMAL LOW (ref 39.0–52.0)
Hemoglobin: 8.3 g/dL — ABNORMAL LOW (ref 13.0–17.0)
MCH: 37.4 pg — ABNORMAL HIGH (ref 26.0–34.0)
MCHC: 32.8 g/dL (ref 30.0–36.0)
MCV: 114 fL — ABNORMAL HIGH (ref 80.0–100.0)
Platelets: 210 10*3/uL (ref 150–400)
RBC: 2.22 MIL/uL — ABNORMAL LOW (ref 4.22–5.81)
RDW: 16.3 % — ABNORMAL HIGH (ref 11.5–15.5)
WBC: 6.1 10*3/uL (ref 4.0–10.5)
nRBC: 3.1 % — ABNORMAL HIGH (ref 0.0–0.2)

## 2023-01-25 LAB — TROPONIN I (HIGH SENSITIVITY)
Troponin I (High Sensitivity): 943 ng/L (ref <18)
Troponin I (High Sensitivity): 981 ng/L (ref <18)

## 2023-01-25 LAB — BASIC METABOLIC PANEL
Anion gap: 13 (ref 5–15)
BUN: 35 mg/dL — ABNORMAL HIGH (ref 8–23)
CO2: 20 mmol/L — ABNORMAL LOW (ref 22–32)
Calcium: 9 mg/dL (ref 8.9–10.3)
Chloride: 99 mmol/L (ref 98–111)
Creatinine, Ser: 1.04 mg/dL (ref 0.61–1.24)
GFR, Estimated: >60 mL/min (ref >60)
Glucose, Bld: 162 mg/dL — ABNORMAL HIGH (ref 70–99)
Potassium: 4.1 mmol/L (ref 3.5–5.1)
Sodium: 132 mmol/L — ABNORMAL LOW (ref 135–145)

## 2023-01-25 LAB — VITAMIN B12: Vitamin B-12: 272 pg/mL (ref 180–914)

## 2023-01-25 LAB — FOLATE: Folate: 8 ng/mL (ref >5.9)

## 2023-01-25 LAB — IRON AND TIBC
Iron: 43 ug/dL — ABNORMAL LOW (ref 45–182)
Saturation Ratios: 20 % (ref 17.9–39.5)
TIBC: 214 ug/dL — ABNORMAL LOW (ref 250–450)
UIBC: 171 ug/dL

## 2023-01-25 LAB — BRAIN NATRIURETIC PEPTIDE: B Natriuretic Peptide: 2165.9 pg/mL — ABNORMAL HIGH (ref 0.0–100.0)

## 2023-01-25 LAB — RETICULOCYTES
Immature Retic Fract: 21.5 % — ABNORMAL HIGH (ref 2.3–15.9)
RBC.: 2.22 MIL/uL — ABNORMAL LOW (ref 4.22–5.81)
Retic Count, Absolute: 43.3 10*3/uL (ref 19.0–186.0)
Retic Ct Pct: 2 % (ref 0.4–3.1)

## 2023-01-25 LAB — FERRITIN: Ferritin: 701 ng/mL — ABNORMAL HIGH (ref 24–336)

## 2023-01-25 MED ORDER — ALPRAZOLAM 0.25 MG PO TABS: 0.2500 mg | ORAL_TABLET | Freq: Two times a day (BID) | ORAL | Status: DC | PRN

## 2023-01-25 MED ORDER — NITROGLYCERIN 0.4 MG SL SUBL: 0.4000 mg | SUBLINGUAL_TABLET | SUBLINGUAL | Status: DC | PRN

## 2023-01-25 MED ORDER — ASPIRIN 81 MG PO TBEC
81.0000 mg | DELAYED_RELEASE_TABLET | Freq: Every day | ORAL | Status: DC
  Administered 2023-01-26 – 2023-01-28 (×3): 81 mg via ORAL
  Filled 2023-01-25 (×3): qty 1

## 2023-01-25 MED ORDER — CLOPIDOGREL BISULFATE 75 MG PO TABS
75.0000 mg | ORAL_TABLET | Freq: Every day | ORAL | Status: DC
  Administered 2023-01-26 – 2023-01-28 (×3): 75 mg via ORAL
  Filled 2023-01-25 (×3): qty 1

## 2023-01-25 MED ORDER — TRAZODONE HCL 50 MG PO TABS: 25.0000 mg | ORAL_TABLET | Freq: Every evening | ORAL | Status: DC | PRN

## 2023-01-25 MED ORDER — ZOLPIDEM TARTRATE 5 MG PO TABS: 5.0000 mg | ORAL_TABLET | Freq: Every evening | ORAL | Status: DC | PRN

## 2023-01-25 MED ORDER — FUROSEMIDE 10 MG/ML IJ SOLN
20.0000 mg | Freq: Two times a day (BID) | INTRAMUSCULAR | Status: DC
  Administered 2023-01-25: 20 mg via INTRAVENOUS
  Filled 2023-01-25: qty 2

## 2023-01-25 MED ORDER — HEPARIN (PORCINE) 25000 UT/250ML-% IV SOLN
1100.0000 [IU]/h | INTRAVENOUS | Status: DC
  Administered 2023-01-25: 1100 [IU]/h via INTRAVENOUS
  Filled 2023-01-25: qty 250

## 2023-01-25 MED ORDER — ONDANSETRON HCL 4 MG/2ML IJ SOLN: 4.0000 mg | Freq: Four times a day (QID) | INTRAMUSCULAR | Status: DC | PRN

## 2023-01-25 MED ORDER — MAGNESIUM HYDROXIDE 400 MG/5ML PO SUSP
30.0000 mL | Freq: Every day | ORAL | Status: DC | PRN
  Filled 2023-01-25: qty 30

## 2023-01-25 MED ORDER — LORAZEPAM 2 MG/ML IJ SOLN
0.5000 mg | Freq: Four times a day (QID) | INTRAMUSCULAR | Status: DC | PRN
  Administered 2023-01-25: 0.5 mg via INTRAVENOUS
  Filled 2023-01-25: qty 1

## 2023-01-25 MED ORDER — TAMSULOSIN HCL 0.4 MG PO CAPS
0.4000 mg | ORAL_CAPSULE | Freq: Two times a day (BID) | ORAL | Status: DC
  Administered 2023-01-26: 0.4 mg via ORAL
  Filled 2023-01-25 (×2): qty 1

## 2023-01-25 MED ORDER — ACETAMINOPHEN 325 MG PO TABS: 650.0000 mg | ORAL_TABLET | ORAL | Status: DC | PRN

## 2023-01-25 MED ORDER — HALOPERIDOL LACTATE 5 MG/ML IJ SOLN: 1.0000 mg | Freq: Four times a day (QID) | INTRAMUSCULAR | Status: DC | PRN

## 2023-01-25 MED ORDER — HEPARIN BOLUS VIA INFUSION
4000.0000 [IU] | Freq: Once | INTRAVENOUS | Status: AC
  Administered 2023-01-25: 4000 [IU] via INTRAVENOUS
  Filled 2023-01-25: qty 4000

## 2023-01-25 MED ORDER — ROSUVASTATIN CALCIUM 20 MG PO TABS
20.0000 mg | ORAL_TABLET | Freq: Every day | ORAL | Status: DC
  Administered 2023-01-26: 20 mg via ORAL
  Filled 2023-01-25 (×2): qty 1

## 2023-01-25 MED ORDER — FUROSEMIDE 10 MG/ML IJ SOLN: 20.0000 mg | Freq: Two times a day (BID) | INTRAMUSCULAR | Status: DC

## 2023-01-25 MED ORDER — FUROSEMIDE 10 MG/ML IJ SOLN
20.0000 mg | Freq: Once | INTRAMUSCULAR | Status: AC
  Administered 2023-01-25: 20 mg via INTRAVENOUS
  Filled 2023-01-25: qty 2

## 2023-01-25 MED ORDER — RANOLAZINE ER 500 MG PO TB12
500.0000 mg | ORAL_TABLET | Freq: Two times a day (BID) | ORAL | Status: DC
  Administered 2023-01-26: 500 mg via ORAL
  Filled 2023-01-25 (×2): qty 1

## 2023-01-25 NOTE — ED Notes (Signed)
Called [facility] and spoke about consult.

## 2023-01-25 NOTE — Addendum Note (Signed)
Addended by: Anabel Halon on: 01/25/2023 05:23 PM   Modules accepted: Orders

## 2023-01-25 NOTE — ED Triage Notes (Signed)
The past 3 days has becoming more short of breath with exertion and chest pain. Been taking more nitroglycerin recently. HX of CHF. Decreased oral intake.  Took 1 nitroglycerin 15 mins pta. Denies chest pain on arrival. SpO2 86% on arrival.

## 2023-01-25 NOTE — H&P (Signed)
[place]   PATIENT NAME: Ian Harrison    MR#:  [id number]  DATE OF BIRTH:  1927-06-12  DATE OF ADMISSION:  01/25/2023  PRIMARY CARE PHYSICIAN: Mackie Pai, PA-C   Patient is coming from: Home  REQUESTING/REFERRING PHYSICIAN:Steinl, Lennette Bihari, MD  CHIEF COMPLAINT:   Chief Complaint  Patient presents with   Shortness of Breath    HISTORY OF PRESENT ILLNESS:  Ian Harrison is a 89 y.o. Caucasian male with medical history significant for diastolic CHF, type diabetes mellitus, hypertension, coronary artery disease, dyslipidemia and dementia, who presented to the emergency room with acute onset of dyspnea mainly on exertion as well as worsening lower extremity edema.  The patient is a fairly poor historian due to his dementia.  ED Course: When the patient came to the ER, vital signs were within normal and pulse 70 was 95% on 2 L O2 by nasal cannula and later 94% on 3 L.  Labs revealed mild hyponatremia 132 and a CO2 of 20 with blood glucose of 162 and BUN 35, BNP 2165.9 and high sensitive troponin I of 943 and later 981.  CBC showed anemia with hemoglobin 8.3 and hematocrit 25.3 down from 11.5 and 33.8 and last June. EKG as reviewed by me : EKG showed sinus tachycardia with rate of 105 with low voltage QRS and prolonged QT interval with QTc of 501 MS. Imaging: Portable chest x-ray showed small pleural effusions at the bases with some adjacent opacities, right greater than left with peribronchial thickening.  Follow-up was recommended.  The patient was given 20 mg of IV Lasix.  Contact was made with Dr. Stanford Breed who is aware about the patient.  The patient was then admitted to a medical telemetry bed at [facility] for further evaluation and management. PAST MEDICAL HISTORY:   Past Medical History:  Diagnosis Date   AAA (abdominal aortic aneurysm) without rupture 09/03/2019   Acute lower UTI 07/10/2020   Aortic aneurysm    Arteriosclerosis of coronary artery 09/03/2019   CAD  (coronary artery disease)    Cancer    Skin cancer   Chest pain 07/10/2020   Chronic diastolic CHF (congestive heart failure) 07/10/2020   Chronic kidney disease    Coronary artery disease involving native coronary artery of native heart without angina pectoris 09/03/2019   Decreased activities of daily living (ADL) 09/09/2022   Dementia    Dementia without behavioral disturbance 09/24/2019   Diabetes mellitus type II, non insulin dependent    Dyslipidemia    Intolerance to atorvastatin due to myalgias   Elevated troponin    Essential hypertension 09/03/2019   Generalized weakness 07/10/2020   Hyperkalemia 07/10/2020   Myocardial infarction    15 years ago   Stable angina pectoris 02/01/2022   Vasovagal syncope    Without recurrence    PAST SURGICAL HISTORY:   Past Surgical History:  Procedure Laterality Date   ABDOMINAL AORTIC ANEURYSM REPAIR     APPENDECTOMY     CATARACT EXTRACTION W/ INTRAOCULAR LENS IMPLANT     CYSTOSCOPY WITH FULGERATION N/A 06/23/2020   Procedure: CYSTOSCOPY WITH CLOT EVACUATION;  Surgeon: Robley Fries, MD;  [facility];  Service: Urology;  Laterality: N/A;   HERNIA REPAIR     LEFT HEART CATH AND CORONARY ANGIOGRAPHY N/A 07/14/2020   Procedure: LEFT HEART CATH AND CORONARY ANGIOGRAPHY;  Surgeon: Burnell Blanks, MD;  Location: [place] [facility];  Service: Cardiovascular;  Laterality: N/A;   TRANSURETHRAL RESECTION OF BLADDER  TUMOR N/A 06/23/2020   Procedure: TRANSURETHRAL RESECTION OF BLADDER TUMOR (TURBT);  Surgeon: Robley Fries, MD;  [facility];  Service: Urology;  Laterality: N/A;  90 MINS   WISDOM TOOTH EXTRACTION      SOCIAL HISTORY:   Social History   Tobacco Use   Smoking status: Former   Smokeless tobacco: Never   Tobacco comments:    Quit 17 years ago  Substance Use Topics   Alcohol use: Not Currently    FAMILY HISTORY:   Family History  Problem Relation Age of Onset   Heart attack Brother     DRUG  ALLERGIES:   Allergies  Allergen Reactions   Atorvastatin Other (See Comments)    myalgia   Morphine And Related Other (See Comments)    Mood changes (anxious)    REVIEW OF SYSTEMS:   ROS As per history of present illness. All pertinent systems were reviewed above. Constitutional, HEENT, cardiovascular, respiratory, GI, GU, musculoskeletal, neuro, psychiatric, endocrine, integumentary and hematologic systems were reviewed and are otherwise negative/unremarkable except for positive findings mentioned above in the HPI.   MEDICATIONS AT HOME:   Prior to Admission medications   Medication Sig Start Date End Date Taking? Authorizing Provider  aspirin EC 81 MG tablet Take 81 mg by mouth daily with breakfast.     [provider]  clopidogrel (PLAVIX) 75 MG tablet Take 1 tablet (75 mg total) by mouth daily. 05/03/22   Revankar, Reita Cliche, MD  nitroGLYCERIN (NITROSTAT) 0.4 MG SL tablet Place 1 tablet (0.4 mg total) under the tongue every 5 (five) minutes as needed for chest pain. 03/10/22   Revankar, Reita Cliche, MD  ranolazine (RANEXA) 500 MG 12 hr tablet Take 1 tablet (500 mg total) by mouth 2 (two) times daily. Patient not taking: Reported on 08/09/2022 02/01/22   Revankar, Reita Cliche, MD  ranolazine (RANEXA) 500 MG 12 hr tablet Take 1 tablet (500 mg total) by mouth 2 (two) times daily. Patient not taking: Reported on 11/16/2022 08/11/22     tamsulosin (FLOMAX) 0.4 MG CAPS capsule Take 1 capsule (0.4 mg total) by mouth 2 (two) times daily. 01/13/23     traZODone (DESYREL) 50 MG tablet Take 0.5-1 tablets (25-50 mg total) by mouth at bedtime as needed for sleep. 08/30/22   Saguier, Percell Miller, PA-C      VITAL SIGNS:  Blood pressure 106/69, pulse 96, temperature 97.7 F (36.5 C), temperature source Oral, resp. rate 20, height 5\' 11"  (1.803 m), weight 73.5 kg, SpO2 90 %.  PHYSICAL EXAMINATION:  Physical Exam  GENERAL:  89 y.o.-year-old Caucasian male patient lying in the bed with no acute  distress.  EYES: Pupils equal, round, reactive to light and accommodation. No scleral icterus. Extraocular muscles intact.  HEENT: Head atraumatic, normocephalic. Oropharynx and nasopharynx clear.  NECK:  Supple, no jugular venous distention. No thyroid enlargement, no tenderness.  LUNGS: Normal breath sounds bilaterally, no wheezing, rales,rhonchi or crepitation. No use of accessory muscles of respiration.  CARDIOVASCULAR: Regular rate and rhythm, S1, S2 normal. No murmurs, rubs, or gallops.  ABDOMEN: Soft, nondistended, nontender. Bowel sounds present. No organomegaly or mass.  EXTREMITIES: No pedal edema, cyanosis, or clubbing.  NEUROLOGIC: Cranial nerves II through XII are intact. Muscle strength 5/5 in all extremities. Sensation intact. Gait not checked.  PSYCHIATRIC: The patient is alert and oriented x 3.  Normal affect and good eye contact. SKIN: No obvious rash, lesion, or ulcer.    LABORATORY PANEL:   CBC Recent Labs  Lab 01/25/23 1304  WBC 6.1  HGB 8.3*  HCT 25.3*  PLT 210   ------------------------------------------------------------------------------------------------------------------  Chemistries  Recent Labs  Lab 01/25/23 1304  NA 132*  K 4.1  CL 99  CO2 20*  GLUCOSE 162*  BUN 35*  CREATININE 1.04  CALCIUM 9.0   ------------------------------------------------------------------------------------------------------------------  Cardiac Enzymes No results for input(s): "TROPONINI" in the last 168 hours. ------------------------------------------------------------------------------------------------------------------  RADIOLOGY:  DG Chest Portable 1 View  Result Date: 01/25/2023 CLINICAL DATA:  Shortness of breath.  Hypoxia EXAM: PORTABLE CHEST 1 VIEW COMPARISON:  Chest x-ray 08/08/2022 FINDINGS: Small bilateral pleural effusions with some adjacent opacities. Right-greater-than-left. Atelectasis versus infiltrate. Recommend follow-up. No pneumothorax. Enlarged  cardiopericardial silhouette. Peribronchial thickening. Overlapping cardiac leads. Film is under penetrated degenerative changes of the shoulders. IMPRESSION: Small pleural effusions at the bases with some adjacent opacities, right-greater-than-left with peribronchial thickening. Recommend follow-up. Electronically Signed   By: Jill Side M.D.   On: 01/25/2023 13:26      IMPRESSION AND PLAN:  Assessment and Plan: No notes have been filed under this hospital service. Service: Hospitalist      DVT prophylaxis: IV heparin Advanced Care Planning:  Code Status: full code. Family Communication:  The plan of care was discussed in details with the patient (and family). I answered all questions. The patient agreed to proceed with the above mentioned plan. Further management will depend upon hospital course. Disposition Plan: Back to previous home environment Consults called: Cardiology. All the records are reviewed and case discussed with ED provider.  Status is: Inpatient    At the time of the admission, it appears that the appropriate admission status for this patient is inpatient.  This is judged to be reasonable and necessary in order to provide the required intensity of service to ensure the patient's safety given the presenting symptoms, physical exam findings and initial radiographic and laboratory data in the context of comorbid conditions.  The patient requires inpatient status due to high intensity of service, high risk of further deterioration and high frequency of surveillance required.  I certify that at the time of admission, it is my clinical judgment that the patient will require inpatient hospital care extending more than 2 midnights.                            Dispo: The patient is from: Home              Anticipated d/c is to: Home              Patient currently is not medically stable to d/c.              Difficult to place patient: No  Christel Mormon M.D on 01/25/2023 at  9:34 PM  [facility]   From 7 PM-7 AM, contact night-coverage [email or web address]  CC: Primary care physician; Mackie Pai, PA-C

## 2023-01-25 NOTE — Plan of Care (Signed)
Plan of Care Note for accepted transfer   Patient: Ian Harrison MRN: [id number]   DOA: 01/25/2023  Facility requesting transfer: [facility] Requesting Provider: Lajean Saver, MD Reason for transfer: Acute on chronic diastolic heart failure Facility course: Lasix IV 20 mg, chest x-ray significant for small pleural effusions and right > left peribronchial thickening. Labs significant for a BNP of 2166, troponin of 943 (delta not available) and hemoglobin of 8.3.  Plan of care: The patient is accepted for admission to Telemetry unit, at [facility]. Patient with evidence of acute on chronic diastolic heart failure as evidenced by worsening dyspnea and leg swelling with associated hypoxia (SpO2 down to 87% on room air) requiring administration of Lasix IV. Requested cardiology consult for associated significantly elevated troponin, which may be related to acute heart failure; delta troponin is pending. FOBT and anemia panel are pending.  Author: Cordelia Poche, MD 01/25/2023  Check [email or web address] for on-call coverage.  Nursing staff, Please call [place] number on Amion as soon as patient's arrival, so appropriate admitting provider can evaluate the pt.

## 2023-01-25 NOTE — ED Notes (Signed)
Daughter in room , she was updated on pts condition , given pts room number at [facility] and updated on [place] being transport to [facility]

## 2023-01-25 NOTE — ED Notes (Signed)
RT assessed patient in triage. BBS clear but SAT 84%. Placed on [facility]

## 2023-01-25 NOTE — ED Provider Notes (Addendum)
[facility] Provider Note   CSN: [id number] Arrival date & time: 01/25/23  1252     History  Chief Complaint  Patient presents with   Shortness of Breath    Ian Harrison is a 89 y.o. male.  Pt with hx dementia, chf, htn, presents c/o mildly increased bilateral leg swelling, and doe for past few days. Pt limited historian, dementia, level 5 caveat. Much of history from spouse - no recent c/o chest pain. No cough or fevers. No change in home meds. No fevers.   The history is provided by the patient, medical records and the spouse. The history is limited by the condition of the patient.  Shortness of Breath Associated symptoms: no abdominal pain, no chest pain, no cough, no fever and no vomiting        Home Medications Prior to Admission medications   Medication Sig Start Date End Date Taking? Authorizing Provider  aspirin EC 81 MG tablet Take 81 mg by mouth daily with breakfast.     [provider]  clopidogrel (PLAVIX) 75 MG tablet Take 1 tablet (75 mg total) by mouth daily. 05/03/22   Revankar, Reita Cliche, MD  nitroGLYCERIN (NITROSTAT) 0.4 MG SL tablet Place 1 tablet (0.4 mg total) under the tongue every 5 (five) minutes as needed for chest pain. 03/10/22   Revankar, Reita Cliche, MD  ranolazine (RANEXA) 500 MG 12 hr tablet Take 1 tablet (500 mg total) by mouth 2 (two) times daily. Patient not taking: Reported on 08/09/2022 02/01/22   Revankar, Reita Cliche, MD  ranolazine (RANEXA) 500 MG 12 hr tablet Take 1 tablet (500 mg total) by mouth 2 (two) times daily. Patient not taking: Reported on 11/16/2022 08/11/22     tamsulosin (FLOMAX) 0.4 MG CAPS capsule Take 1 capsule (0.4 mg total) by mouth 2 (two) times daily. 01/13/23     traZODone (DESYREL) 50 MG tablet Take 0.5-1 tablets (25-50 mg total) by mouth at bedtime as needed for sleep. 08/30/22   Saguier, Percell Miller, PA-C      Allergies    Atorvastatin and Morphine and related    Review of  Systems   Review of Systems  Constitutional:  Negative for fever.  Respiratory:  Positive for shortness of breath. Negative for cough.   Cardiovascular:  Positive for leg swelling. Negative for chest pain.  Gastrointestinal:  Negative for abdominal pain and vomiting.  Genitourinary:  Negative for dysuria.    Physical Exam Updated Vital Signs BP 104/73   Pulse 91   Temp 98.5 F (36.9 C) (Oral)   Resp (!) 21   Ht 1.803 m (5\' 11" )   Wt 73.5 kg   SpO2 94%   BMI 22.59 kg/m  Physical Exam Vitals and nursing note reviewed.  Constitutional:      Appearance: Normal appearance. He is well-developed.  HENT:     Head: Atraumatic.     Nose: Nose normal.     Mouth/Throat:     Mouth: Mucous membranes are moist.     Pharynx: Oropharynx is clear.  Eyes:     General: No scleral icterus.    Conjunctiva/sclera: Conjunctivae normal.  Neck:     Trachea: No tracheal deviation.  Cardiovascular:     Rate and Rhythm: Normal rate and regular rhythm.     Pulses: Normal pulses.     Heart sounds: Murmur heard.     No friction rub. No gallop.  Pulmonary:     Effort: Pulmonary effort  is normal. No accessory muscle usage or respiratory distress.     Breath sounds: Normal breath sounds.  Abdominal:     General: Bowel sounds are normal. There is no distension.     Palpations: Abdomen is soft.     Tenderness: There is no abdominal tenderness.  Genitourinary:    Comments: No cva tenderness. Stool is light brown - sent for hemoccult.  Musculoskeletal:     Cervical back: Normal range of motion and neck supple. No rigidity.     Comments: Mild-mod symmetric bilateral lower leg edema.   Skin:    General: Skin is warm and dry.     Findings: No rash.  Neurological:     Mental Status: He is alert.     Comments: Alert, speech clear.   Psychiatric:        Mood and Affect: Mood normal.     ED Results / Procedures / Treatments   Labs (all labs ordered are listed, but only abnormal results are  displayed) Results for orders placed or performed during the hospital encounter of [id number]  Basic metabolic panel  Result Value Ref Range   Sodium 132 (L) 135 - 145 mmol/L   Potassium 4.1 3.5 - 5.1 mmol/L   Chloride 99 98 - 111 mmol/L   CO2 20 (L) 22 - 32 mmol/L   Glucose, Bld 162 (H) 70 - 99 mg/dL   BUN 35 (H) 8 - 23 mg/dL   Creatinine, Ser 1.04 0.61 - 1.24 mg/dL   Calcium 9.0 8.9 - 10.3 mg/dL   GFR, Estimated >60 >60 mL/min   Anion gap 13 5 - 15  CBC  Result Value Ref Range   WBC 6.1 4.0 - 10.5 K/uL   RBC 2.22 (L) 4.22 - 5.81 MIL/uL   Hemoglobin 8.3 (L) 13.0 - 17.0 g/dL   HCT 25.3 (L) 39.0 - 52.0 %   MCV 114.0 (H) 80.0 - 100.0 fL   MCH 37.4 (H) 26.0 - 34.0 pg   MCHC 32.8 30.0 - 36.0 g/dL   RDW 16.3 (H) 11.5 - 15.5 %   Platelets 210 150 - 400 K/uL   nRBC 3.1 (H) 0.0 - 0.2 %  Brain natriuretic peptide  Result Value Ref Range   B Natriuretic Peptide 2,165.9 (H) 0.0 - 100.0 pg/mL  Troponin I (High Sensitivity)  Result Value Ref Range   Troponin I (High Sensitivity) 943 (HH) <18 ng/L   DG Chest Portable 1 View  Result Date: 01/25/2023 CLINICAL DATA:  Shortness of breath.  Hypoxia EXAM: PORTABLE CHEST 1 VIEW COMPARISON:  Chest x-ray 08/08/2022 FINDINGS: Small bilateral pleural effusions with some adjacent opacities. Right-greater-than-left. Atelectasis versus infiltrate. Recommend follow-up. No pneumothorax. Enlarged cardiopericardial silhouette. Peribronchial thickening. Overlapping cardiac leads. Film is under penetrated degenerative changes of the shoulders. IMPRESSION: Small pleural effusions at the bases with some adjacent opacities, right-greater-than-left with peribronchial thickening. Recommend follow-up. Electronically Signed   By: Jill Side M.D.   On: 01/25/2023 13:26    EKG EKG Interpretation  Date/Time:  Tuesday January 25 2023 13:15:00 EDT Ventricular Rate:  100 PR Interval:    QRS Duration: 96 QT Interval:  366 QTC Calculation: 473 R Axis:   56 Text  Interpretation: Sinus rhythm Premature atrial complexes Non-specific ST-t changes Confirmed by Lajean Saver [phone]) on 01/25/2023 1:23:01 PM  Radiology DG Chest Portable 1 View  Result Date: 01/25/2023 CLINICAL DATA:  Shortness of breath.  Hypoxia EXAM: PORTABLE CHEST 1 VIEW COMPARISON:  Chest x-ray 08/08/2022 FINDINGS: Small bilateral pleural  effusions with some adjacent opacities. Right-greater-than-left. Atelectasis versus infiltrate. Recommend follow-up. No pneumothorax. Enlarged cardiopericardial silhouette. Peribronchial thickening. Overlapping cardiac leads. Film is under penetrated degenerative changes of the shoulders. IMPRESSION: Small pleural effusions at the bases with some adjacent opacities, right-greater-than-left with peribronchial thickening. Recommend follow-up. Electronically Signed   By: Jill Side M.D.   On: 01/25/2023 13:26    Procedures Procedures    Medications Ordered in ED Medications - No data to display  ED Course/ Medical Decision Making/ A&P                             Medical Decision Making Problems Addressed: Acute on chronic combined systolic and diastolic CHF (congestive heart failure): acute illness or injury with systemic symptoms that poses a threat to life or bodily functions Dyspnea on exertion: acute illness or injury with systemic symptoms that poses a threat to life or bodily functions Elevated brain natriuretic peptide (BNP) level: acute illness or injury Elevated troponin: acute illness or injury Hypoxia: acute illness or injury Symptomatic anemia: acute illness or injury with systemic symptoms that poses a threat to life or bodily functions  Amount and/or Complexity of Data Reviewed Independent Historian: spouse    Details: hx External Data Reviewed: labs, radiology and notes. Labs: ordered. Decision-making details documented in ED Course. Radiology: ordered and independent interpretation performed. Decision-making details documented in ED  Course. ECG/medicine tests: ordered and independent interpretation performed. Decision-making details documented in ED Course. Discussion of management or test interpretation with external provider(s): hospitalists  Risk Prescription drug management. Decision regarding hospitalization.   Iv ns. Continuous pulse ox and cardiac monitoring. Labs ordered/sent. Imaging ordered.   Differential diagnosis includes acs,chf, etc . Dispo decision including potential need for admission considered - will get labs and imaging and reassess.   Reviewed nursing notes and prior charts for additional history. External reports reviewed. Additional history from: spouse   Cardiac monitor: sinus rhythm, rate 94.  Labs reviewed/interpreted by me - hgb 8, lower than prior, no report of recent blood loss. Will send hemoocult.   Xrays reviewed/interpreted by me - no def pna. ?vascular congestion, eff.   Discussed labs w pt/spouse - given new/worsening anemia, ?contributing to chf/heart strain, elevated trop.    Lasix dose iv.   Hospitalists consulted for admission. Discussed pt, will admit, request cardiology consult.  Cardiology consulted. Discussed pt, labs, etc, with Dr Stanford Breed - he indicates they will see in consult.    CRITICAL CARE RE: dyspnea/hypoxia w CHF, new/worsening anemia, elevated troponin/cardiac strain.  Performed by: Mirna Mires Total critical care time: 40 minutes Critical care time was exclusive of separately billable procedures and treating other patients. Critical care was necessary to treat or prevent imminent or life-threatening deterioration. Critical care was time spent personally by me on the following activities: development of treatment plan with patient and/or surrogate as well as nursing, discussions with consultants, evaluation of patient's response to treatment, examination of patient, obtaining history from patient or surrogate, ordering and performing treatments and  interventions, ordering and review of laboratory studies, ordering and review of radiographic studies, pulse oximetry and re-evaluation of patient's condition.  Recheck pt, no chest pain or discomfort. Breathing comfortably. Awaiting admission.           Final Clinical Impression(s) / ED Diagnoses Final diagnoses:  None    Rx / DC Orders ED Discharge Orders     None  Lajean Saver, MD 01/25/23 [phone]

## 2023-01-25 NOTE — Consult Note (Signed)
Cardiology Consultation   Patient ID: Ian Harrison MRN: [id number]; DOB: Mar 30, 1927  Admit date: 01/25/2023 Date of Consult: 01/25/2023  PCP:  Saguier, Edward, [place] Providers Cardiologist:  None   { Click here to update MD or APP on Care Team, Refresh:1}     Patient Profile:   Ian Harrison is a 89 y.o. male with a hx of CAD, diastolic congestive heart failure, hypertension, dementia who is being seen 01/25/2023 for the evaluation of elevated troponin, CHF exacerbation at the request of Dr. Lonny Prude.  History of Present Illness:   Ian Harrison presented to [facility] with symptoms of lower extremity edema and shortness of breath with exertion. HPI limited due to significant dementia.   Initial workup notable for sodium 132, HGB 8.3, troponin 943->981. BNP elevated at 2165.9. CXR with small pleural effusions at the bases with some adjacent opacities, right-greater-than-left. Patient received IV lasix 20mg  x1 dose and transfer to [facility] arranged.  Cardiac history is notable for CAD, PCI to RCA x2, residual 50% LCX disease. Most recent [place] in 2021 with mild non-obstructive plaque in the mid LAD, mild non-obstructive plaque in the mid circumflex. Patient is followed by Dr. Geraldo Pitter and was last seen 11/16/22. At that visit, patient noted with stable angina, was taking nitroglycerin 1-2 times per month. He was continued on DAPT with ASA/Plavix. Patient previously on beta blocker therapy but was discontinued in 2023 due to borderline BP. Also previously on Ranexa, but stopped due to constipation.  Past Medical History:  Diagnosis Date   AAA (abdominal aortic aneurysm) without rupture 09/03/2019   Acute lower UTI 07/10/2020   Aortic aneurysm    Arteriosclerosis of coronary artery 09/03/2019   CAD (coronary artery disease)    Cancer    Skin cancer   Chest pain 07/10/2020   Chronic diastolic CHF (congestive heart failure) 07/10/2020   Chronic  kidney disease    Coronary artery disease involving native coronary artery of native heart without angina pectoris 09/03/2019   Decreased activities of daily living (ADL) 09/09/2022   Dementia    Dementia without behavioral disturbance 09/24/2019   Diabetes mellitus type II, non insulin dependent    Dyslipidemia    Intolerance to atorvastatin due to myalgias   Elevated troponin    Essential hypertension 09/03/2019   Generalized weakness 07/10/2020   Hyperkalemia 07/10/2020   Myocardial infarction    15 years ago   Stable angina pectoris 02/01/2022   Vasovagal syncope    Without recurrence    Past Surgical History:  Procedure Laterality Date   ABDOMINAL AORTIC ANEURYSM REPAIR     APPENDECTOMY     CATARACT EXTRACTION W/ INTRAOCULAR LENS IMPLANT     CYSTOSCOPY WITH FULGERATION N/A 06/23/2020   Procedure: CYSTOSCOPY WITH CLOT EVACUATION;  Surgeon: Robley Fries, MD;  Location: [facility];  Service: Urology;  Laterality: N/A;   HERNIA REPAIR     LEFT HEART CATH AND CORONARY ANGIOGRAPHY N/A 07/14/2020   Procedure: LEFT HEART CATH AND CORONARY ANGIOGRAPHY;  Surgeon: Burnell Blanks, MD;  Location: [facility];  Service: Cardiovascular;  Laterality: N/A;   TRANSURETHRAL RESECTION OF BLADDER TUMOR N/A 06/23/2020   Procedure: TRANSURETHRAL RESECTION OF BLADDER TUMOR (TURBT);  Surgeon: Robley Fries, MD;  Location: [facility];  Service: Urology;  Laterality: N/A;  [address] [place] Medications:  Prior to Admission medications   Medication Sig Start  Date End Date Taking? Authorizing Provider  aspirin EC 81 MG tablet Take 81 mg by mouth daily with breakfast.     [provider]  clopidogrel (PLAVIX) 75 MG tablet Take 1 tablet (75 mg total) by mouth daily. 05/03/22   Revankar, Reita Cliche, MD  nitroGLYCERIN (NITROSTAT) 0.4 MG SL tablet Place 1 tablet (0.4 mg total) under the tongue every 5 (five) minutes as needed for chest pain. 03/10/22   Revankar,  Reita Cliche, MD  ranolazine (RANEXA) 500 MG 12 hr tablet Take 1 tablet (500 mg total) by mouth 2 (two) times daily. Patient not taking: Reported on 08/09/2022 02/01/22   Revankar, Reita Cliche, MD  ranolazine (RANEXA) 500 MG 12 hr tablet Take 1 tablet (500 mg total) by mouth 2 (two) times daily. Patient not taking: Reported on 11/16/2022 08/11/22     tamsulosin (FLOMAX) 0.4 MG CAPS capsule Take 1 capsule (0.4 mg total) by mouth 2 (two) times daily. 01/13/23     traZODone (DESYREL) 50 MG tablet Take 0.5-1 tablets (25-50 mg total) by mouth at bedtime as needed for sleep. 08/30/22   Saguier, Percell Miller, PA-C    Inpatient Medications: Scheduled Meds:  Continuous Infusions:  PRN Meds:   Allergies:    Allergies  Allergen Reactions   Atorvastatin Other (See Comments)    myalgia   Morphine And Related Other (See Comments)    Mood changes (anxious)    Social History:   Social History   Socioeconomic History   Marital status: Married    Spouse name: Not on file   Number of children: Not on file   Years of education: Not on file   Highest education level: Not on file  Occupational History   Not on file  Tobacco Use   Smoking status: Former   Smokeless tobacco: Never   Tobacco comments:    Quit 17 years ago  Vaping Use   Vaping Use: Never used  Substance and Sexual Activity   Alcohol use: Not Currently   Drug use: Never   Sexual activity: Not on file  Other Topics Concern   Not on file  Social History Narrative   Not on file   Social Determinants of Health   Financial Resource Strain: Low Risk  (08/09/2022)   Overall Financial Resource Strain (CARDIA)    Difficulty of Paying Living Expenses: Not hard at all  Food Insecurity: No Food Insecurity (08/09/2022)   Hunger Vital Sign    Worried About Running Out of Food in the Last Year: Never true    [place] in the Last Year: Never true  Transportation Needs: No Transportation Needs (08/09/2022)   PRAPARE - Civil engineer, contracting (Medical): No    Lack of Transportation (Non-Medical): No  Physical Activity: Inactive (08/09/2022)   Exercise Vital Sign    Days of Exercise per Week: 0 days    Minutes of Exercise per Session: 0 min  Stress: No Stress Concern Present (07/14/2021)   [place]    Feeling of Stress : Not at all  Social Connections: Moderately Isolated (07/14/2021)   Social Connection and Isolation Panel [NHANES]    Frequency of Communication with Friends and Family: More than three times a week    Frequency of Social Gatherings with Friends and Family: More than three times a week    Attends Religious Services: Never    Marine scientist or Organizations: No  Attends Archivist Meetings: Never    Marital Status: Married  Human resources officer Violence: Not At Risk (07/14/2021)   Humiliation, Afraid, Rape, and Kick questionnaire    Fear of Current or Ex-Partner: No    Emotionally Abused: No    Physically Abused: No    Sexually Abused: No    Family History:    Family History  Problem Relation Age of Onset   Heart attack Brother      ROS:  Please see the history of present illness.   All other ROS reviewed and negative.     Physical Exam/Data:   Vitals:   01/25/23 1301 01/25/23 1315 01/25/23 1330 01/25/23 1400  BP:  131/75 111/70 104/73  Pulse:  99 92 91  Resp:  (!) 26 (!) 25 (!) 21  Temp:      TempSrc:      SpO2: 95% 94% 96% 94%  Weight:      Height:       No intake or output data in the 24 hours ending 01/25/23 1635    01/25/2023   12:59 PM 11/16/2022    3:26 PM 08/09/2022    8:32 AM  Last 3 Weights  Weight (lbs) 162 lb 162 lb 1.3 oz 170 lb  Weight (kg) 73.483 kg 73.519 kg 77.111 kg     Body mass index is 22.59 kg/m.  General:  Well nourished, well developed, in no acute distress*** HEENT: normal Neck: no JVD Vascular: No carotid bruits; Distal pulses 2+ bilaterally Cardiac:   normal S1, S2; RRR; no murmur *** Lungs:  clear to auscultation bilaterally, no wheezing, rhonchi or rales  Abd: soft, nontender, no hepatomegaly  Ext: no edema Musculoskeletal:  No deformities, BUE and BLE strength normal and equal Skin: warm and dry  Neuro:  CNs 2-12 intact, no focal abnormalities noted Psych:  Normal affect   EKG:  The EKG was personally reviewed and demonstrates:  sinus rhythm with baseline artifact. *** Telemetry:  Telemetry was personally reviewed and demonstrates:  ***  Relevant CV Studies: ***  Laboratory Data:  High Sensitivity Troponin:   Recent Labs  Lab 01/25/23 1304 01/25/23 1504  TROPONINIHS 943* 981*     Chemistry Recent Labs  Lab 01/25/23 1304  NA 132*  K 4.1  CL 99  CO2 20*  GLUCOSE 162*  BUN 35*  CREATININE 1.04  CALCIUM 9.0  GFRNONAA >60  ANIONGAP 13    No results for input(s): "PROT", "ALBUMIN", "AST", "ALT", "ALKPHOS", "BILITOT" in the last 168 hours. Lipids No results for input(s): "CHOL", "TRIG", "HDL", "LABVLDL", "LDLCALC", "CHOLHDL" in the last 168 hours.  Hematology Recent Labs  Lab 01/25/23 1304 01/25/23 1444  WBC 6.1  --   RBC 2.22* 2.22*  HGB 8.3*  --   HCT 25.3*  --   MCV 114.0*  --   MCH 37.4*  --   MCHC 32.8  --   RDW 16.3*  --   PLT 210  --    Thyroid No results for input(s): "TSH", "FREET4" in the last 168 hours.  BNP Recent Labs  Lab 01/25/23 1341  BNP 2,165.9*    DDimer No results for input(s): "DDIMER" in the last 168 hours.   Radiology/Studies:  DG Chest Portable 1 View  Result Date: 01/25/2023 CLINICAL DATA:  Shortness of breath.  Hypoxia EXAM: PORTABLE CHEST 1 VIEW COMPARISON:  Chest x-ray 08/08/2022 FINDINGS: Small bilateral pleural effusions with some adjacent opacities. Right-greater-than-left. Atelectasis versus infiltrate. Recommend follow-up. No pneumothorax. Enlarged cardiopericardial  silhouette. Peribronchial thickening. Overlapping cardiac leads. Film is under penetrated degenerative  changes of the shoulders. IMPRESSION: Small pleural effusions at the bases with some adjacent opacities, right-greater-than-left with peribronchial thickening. Recommend follow-up. Electronically Signed   By: Jill Side M.D.   On: 01/25/2023 13:26     Assessment and Plan:   Acute on chronic diastolic CHF  Patient admitted with worsening dyspnea and lower extremity edema. BNP notably elevated to 2165.9. Last TTE in 2020 showed normal LV function, grade I diastolic dysfunction  Continue IV diuresis Given low normal BP which appears to be baseline for patient, will be limited in adding HF GDMT Plan to repeat TTE this admission***  Hx CAD Elevated troponin  Patient with hx PCI to RCA x2. Most recent [place] in 2021 with mild non-obstructive plaque in the mid LAD, mild non-obstructive plaque in the mid circumflex. Patient admitted with acute CHF, found with troponin elevated 943->981.   ECG without evidence of acute ischemic changes. Elevated but flat troponin most indicative of type II MI/demand ischemia in patient with CAD hx admitted with CHF exacerbation.    Risk Assessment/Risk Scores:  {Complete the following score calculators/questions to meet required metrics.  Press F2         :[id number]   {Is the patient being seen for unstable angina, ACS, NSTEMI or STEMI?:[phone]} {Does this patient have CHF or CHF symptoms?      :[id number] {Does this patient have ATRIAL FIBRILLATION?[phone]}  {Are we signing off today?:[id number]}  For questions or updates, please contact [facility] Please consult [email or web address] for contact info under

## 2023-01-25 NOTE — Progress Notes (Signed)
ANTICOAGULATION CONSULT NOTE - Initial Consult  Pharmacy Consult for Heparin Indication: chest pain/ACS  Allergies  Allergen Reactions   Atorvastatin Other (See Comments)    myalgia   Morphine And Related Other (See Comments)    Mood changes (anxious)    Patient Measurements: Height: 5\' 11"  (180.3 cm) Weight: 73.5 kg (162 lb) IBW/kg (Calculated) : 75.3 Heparin Dosing Weight: 73 kg  Vital Signs: Temp: 97.7 F (36.5 C) (04/02 2057) Temp Source: Oral (04/02 2057) BP: 106/69 (04/02 2057) Pulse Rate: 96 (04/02 2057)  Labs: Recent Labs    01/25/23 1304 01/25/23 1504  HGB 8.3*  --   HCT 25.3*  --   PLT 210  --   CREATININE 1.04  --   TROPONINIHS 943* 981*    Estimated Creatinine Clearance: 46.1 mL/min (by C-G formula based on SCr of 1.04 mg/dL).   Medical History: Past Medical History:  Diagnosis Date   AAA (abdominal aortic aneurysm) without rupture 09/03/2019   Acute lower UTI 07/10/2020   Aortic aneurysm    Arteriosclerosis of coronary artery 09/03/2019   CAD (coronary artery disease)    Cancer    Skin cancer   Chest pain 07/10/2020   Chronic diastolic CHF (congestive heart failure) 07/10/2020   Chronic kidney disease    Coronary artery disease involving native coronary artery of native heart without angina pectoris 09/03/2019   Decreased activities of daily living (ADL) 09/09/2022   Dementia    Dementia without behavioral disturbance 09/24/2019   Diabetes mellitus type II, non insulin dependent    Dyslipidemia    Intolerance to atorvastatin due to myalgias   Elevated troponin    Essential hypertension 09/03/2019   Generalized weakness 07/10/2020   Hyperkalemia 07/10/2020   Myocardial infarction    15 years ago   Stable angina pectoris 02/01/2022   Vasovagal syncope    Without recurrence    Medications:  Awaiting home med rec  Assessment: 89 y.o. M presents with SOB. Trop elevated to 981. To begin heparin for ACS. Hgb 8.3 (usually 9-11), plt  wnl. No AC PTA.  Goal of Therapy:  Heparin level 0.3-0.7 units/ml Monitor platelets by anticoagulation protocol: Yes   Plan:  Heparin IV bolus 4000 units Heparin gtt at 1100 units/hr Will f/u heparin level in 8 hours Daily heparin level and CBC  Sherlon Handing, PharmD, BCPS Please see amion for complete clinical pharmacist phone list 01/25/2023,9:23 PM

## 2023-01-26 ENCOUNTER — Inpatient Hospital Stay (HOSPITAL_COMMUNITY): Payer: Medicare Other

## 2023-01-26 DIAGNOSIS — F03918 Unspecified dementia, unspecified severity, with other behavioral disturbance: Secondary | ICD-10-CM | POA: Diagnosis not present

## 2023-01-26 DIAGNOSIS — R7989 Other specified abnormal findings of blood chemistry: Secondary | ICD-10-CM

## 2023-01-26 DIAGNOSIS — Z7189 Other specified counseling: Secondary | ICD-10-CM

## 2023-01-26 DIAGNOSIS — I5043 Acute on chronic combined systolic (congestive) and diastolic (congestive) heart failure: Secondary | ICD-10-CM | POA: Diagnosis not present

## 2023-01-26 DIAGNOSIS — I5033 Acute on chronic diastolic (congestive) heart failure: Secondary | ICD-10-CM

## 2023-01-26 DIAGNOSIS — D649 Anemia, unspecified: Secondary | ICD-10-CM

## 2023-01-26 DIAGNOSIS — Z515 Encounter for palliative care: Secondary | ICD-10-CM | POA: Diagnosis not present

## 2023-01-26 DIAGNOSIS — I214 Non-ST elevation (NSTEMI) myocardial infarction: Secondary | ICD-10-CM

## 2023-01-26 LAB — LIPID PANEL
Cholesterol: 102 mg/dL (ref 0–200)
HDL: 36 mg/dL — ABNORMAL LOW (ref >40)
LDL Cholesterol: 55 mg/dL (ref 0–99)
Total CHOL/HDL Ratio: 2.8 RATIO
Triglycerides: 57 mg/dL (ref <150)
VLDL: 11 mg/dL (ref 0–40)

## 2023-01-26 LAB — BASIC METABOLIC PANEL
Anion gap: 17 — ABNORMAL HIGH (ref 5–15)
BUN: 34 mg/dL — ABNORMAL HIGH (ref 8–23)
CO2: 23 mmol/L (ref 22–32)
Calcium: 8.7 mg/dL — ABNORMAL LOW (ref 8.9–10.3)
Chloride: 98 mmol/L (ref 98–111)
Creatinine, Ser: 1.2 mg/dL (ref 0.61–1.24)
GFR, Estimated: 56 mL/min — ABNORMAL LOW (ref >60)
Glucose, Bld: 138 mg/dL — ABNORMAL HIGH (ref 70–99)
Potassium: 3.9 mmol/L (ref 3.5–5.1)
Sodium: 138 mmol/L (ref 135–145)

## 2023-01-26 LAB — ECHOCARDIOGRAM COMPLETE
AR max vel: 1.93 cm2
AV Area VTI: 2.22 cm2
AV Area mean vel: 2.2 cm2
AV Mean grad: 4.7 mmHg
AV Peak grad: 9.3 mmHg
Ao pk vel: 1.52 m/s
Area-P 1/2: 4.71 cm2
Calc EF: 27.9 %
Height: 73 in
MV M vel: 4.25 m/s
MV Peak grad: 72.3 mmHg
P 1/2 time: 364 msec
Radius: 0.4 cm
S' Lateral: 3.7 cm
Single Plane A2C EF: 29.5 %
Single Plane A4C EF: 26.5 %
Weight: 2582.03 oz

## 2023-01-26 LAB — CBC
HCT: 25.5 % — ABNORMAL LOW (ref 39.0–52.0)
Hemoglobin: 8.5 g/dL — ABNORMAL LOW (ref 13.0–17.0)
MCH: 37.6 pg — ABNORMAL HIGH (ref 26.0–34.0)
MCHC: 33.3 g/dL (ref 30.0–36.0)
MCV: 112.8 fL — ABNORMAL HIGH (ref 80.0–100.0)
Platelets: 202 10*3/uL (ref 150–400)
RBC: 2.26 MIL/uL — ABNORMAL LOW (ref 4.22–5.81)
RDW: 16.3 % — ABNORMAL HIGH (ref 11.5–15.5)
WBC: 6.3 10*3/uL (ref 4.0–10.5)
nRBC: 2.1 % — ABNORMAL HIGH (ref 0.0–0.2)

## 2023-01-26 LAB — PROTIME-INR
INR: 1.2 (ref 0.8–1.2)
Prothrombin Time: 15.5 seconds — ABNORMAL HIGH (ref 11.4–15.2)

## 2023-01-26 LAB — HEPARIN LEVEL (UNFRACTIONATED): Heparin Unfractionated: 0.21 IU/mL — ABNORMAL LOW (ref 0.30–0.70)

## 2023-01-26 MED ORDER — TRAZODONE HCL 50 MG PO TABS
25.0000 mg | ORAL_TABLET | Freq: Every day | ORAL | Status: DC
  Administered 2023-01-26 – 2023-01-27 (×2): 25 mg via ORAL
  Filled 2023-01-26 (×2): qty 1

## 2023-01-26 MED ORDER — VITAMIN B-12 1000 MCG PO TABS
1000.0000 ug | ORAL_TABLET | Freq: Every day | ORAL | Status: DC
  Administered 2023-01-26 – 2023-01-28 (×3): 1000 ug via ORAL
  Filled 2023-01-26 (×3): qty 1

## 2023-01-26 MED ORDER — DIGOXIN 125 MCG PO TABS
0.1250 mg | ORAL_TABLET | Freq: Every day | ORAL | Status: DC
  Administered 2023-01-26 – 2023-01-28 (×3): 0.125 mg via ORAL
  Filled 2023-01-26 (×3): qty 1

## 2023-01-26 MED ORDER — SPIRONOLACTONE 12.5 MG HALF TABLET
12.5000 mg | ORAL_TABLET | Freq: Every day | ORAL | Status: DC
  Filled 2023-01-26: qty 1

## 2023-01-26 MED ORDER — FERROUS SULFATE 325 (65 FE) MG PO TABS
325.0000 mg | ORAL_TABLET | ORAL | Status: DC
  Administered 2023-01-26 – 2023-01-28 (×2): 325 mg via ORAL
  Filled 2023-01-26 (×3): qty 1

## 2023-01-26 MED ORDER — TAMSULOSIN HCL 0.4 MG PO CAPS
0.4000 mg | ORAL_CAPSULE | Freq: Every day | ORAL | Status: DC
  Administered 2023-01-27 – 2023-01-28 (×2): 0.4 mg via ORAL
  Filled 2023-01-26 (×3): qty 1

## 2023-01-26 MED ORDER — ALUM & MAG HYDROXIDE-SIMETH 200-200-20 MG/5ML PO SUSP
30.0000 mL | ORAL | Status: DC | PRN
  Filled 2023-01-26: qty 30

## 2023-01-26 MED ORDER — ENOXAPARIN SODIUM 40 MG/0.4ML IJ SOSY
40.0000 mg | PREFILLED_SYRINGE | INTRAMUSCULAR | Status: DC
  Administered 2023-01-26 – 2023-01-27 (×2): 40 mg via SUBCUTANEOUS
  Filled 2023-01-26 (×2): qty 0.4

## 2023-01-26 MED ORDER — FUROSEMIDE 10 MG/ML IJ SOLN
60.0000 mg | Freq: Two times a day (BID) | INTRAMUSCULAR | Status: DC
  Filled 2023-01-26: qty 6

## 2023-01-26 NOTE — Progress Notes (Signed)
Cardiology Progress Note  Patient ID: Ian Harrison MRN: [id number] DOB: 1927-06-27 Date of Encounter: 01/26/2023  Primary Cardiologist: None  Subjective   Chief Complaint: Confused this AM  HPI: Confusion noted.  Reports no shortness of breath.  Volume overloaded.  At his baseline per nursing reports.  ROS:  All other ROS reviewed and negative. Pertinent positives noted in the HPI.     Inpatient Medications  Scheduled Meds:  aspirin EC  81 mg Oral Q breakfast   clopidogrel  75 mg Oral Daily   vitamin B-12  1,000 mcg Oral Daily   digoxin  0.125 mg Oral Daily   enoxaparin (LOVENOX) injection  40 mg Subcutaneous Q24H   ferrous sulfate  325 mg Oral QODAY   furosemide  60 mg Intravenous BID   ranolazine  500 mg Oral BID   rosuvastatin  20 mg Oral Daily   tamsulosin  0.4 mg Oral BID   Continuous Infusions:  PRN Meds: acetaminophen, ALPRAZolam, magnesium hydroxide, nitroGLYCERIN, ondansetron (ZOFRAN) IV, traZODone   Vital Signs   Vitals:   01/25/23 2057 01/26/23 0008 01/26/23 0608 01/26/23 0828  BP: 106/69 102/67 97/66 95/60   Pulse: 96 81 91 91  Resp: 20 17  18   Temp: 97.7 F (36.5 C)  97.8 F (36.6 C) 97.8 F (36.6 C)  TempSrc: Oral  Oral Oral  SpO2: 100% 100% 97% 100%  Weight: 73.6 kg  73.2 kg   Height: 6\' 1"  (1.854 m)       Intake/Output Summary (Last 24 hours) at 01/26/2023 0904 Last data filed at 01/25/2023 2001 Gross per 24 hour  Intake --  Output 400 ml  Net -400 ml      01/26/2023    6:08 AM 01/25/2023    8:57 PM 01/25/2023   12:59 PM  Last 3 Weights  Weight (lbs) 161 lb 6 oz 162 lb 4.1 oz 162 lb  Weight (kg) 73.2 kg 73.6 kg 73.483 kg      Telemetry  Overnight telemetry shows sinus rhythm 80s, which I personally reviewed.   ECG  The most recent ECG shows ST 100 bpm, which I personally reviewed.   Physical Exam   Vitals:   01/25/23 2057 01/26/23 0008 01/26/23 0608 01/26/23 0828  BP: 106/69 102/67 97/66 95/60   Pulse: 96 81 91 91  Resp: 20 17  18    Temp: 97.7 F (36.5 C)  97.8 F (36.6 C) 97.8 F (36.6 C)  TempSrc: Oral  Oral Oral  SpO2: 100% 100% 97% 100%  Weight: 73.6 kg  73.2 kg   Height: 6\' 1"  (1.854 m)       Intake/Output Summary (Last 24 hours) at 01/26/2023 0904 Last data filed at 01/25/2023 2001 Gross per 24 hour  Intake --  Output 400 ml  Net -400 ml       01/26/2023    6:08 AM 01/25/2023    8:57 PM 01/25/2023   12:59 PM  Last 3 Weights  Weight (lbs) 161 lb 6 oz 162 lb 4.1 oz 162 lb  Weight (kg) 73.2 kg 73.6 kg 73.483 kg    Body mass index is 21.29 kg/m.  General: Confused Head: Atraumatic, normal size  Eyes: PEERLA, EOMI  Neck: Supple, JVD 10 to 12 cm of water Endocrine: No thryomegaly Cardiac: Normal S1, S2; RRR; no murmurs, rubs, or gallops Lungs: Crackles at the Abd: Soft, nontender, no hepatomegaly  Ext: 2+ pitting edema Musculoskeletal: No deformities, BUE and BLE strength normal and equal Skin: Warm and dry, no rashes  Neuro: Alert, awake, oriented to person only  Labs  High Sensitivity Troponin:   Recent Labs  Lab 01/25/23 1304 01/25/23 1504  TROPONINIHS 943* 981*     Cardiac EnzymesNo results for input(s): "TROPONINI" in the last 168 hours. No results for input(s): "TROPIPOC" in the last 168 hours.  Chemistry Recent Labs  Lab 01/25/23 1304 01/26/23 0608  NA 132* 138  K 4.1 3.9  CL 99 98  CO2 20* 23  GLUCOSE 162* 138*  BUN 35* 34*  CREATININE 1.04 1.20  CALCIUM 9.0 8.7*  GFRNONAA >60 56*  ANIONGAP 13 17*    Hematology Recent Labs  Lab 01/25/23 1304 01/25/23 1444 01/26/23 0608  WBC 6.1  --  6.3  RBC 2.22* 2.22* 2.26*  HGB 8.3*  --  8.5*  HCT 25.3*  --  25.5*  MCV 114.0*  --  112.8*  MCH 37.4*  --  37.6*  MCHC 32.8  --  33.3  RDW 16.3*  --  16.3*  PLT 210  --  202   BNP Recent Labs  Lab 01/25/23 1341  BNP 2,165.9*    DDimer No results for input(s): "DDIMER" in the last 168 hours.   Radiology  DG Chest Portable 1 View  Result Date: 01/25/2023 CLINICAL DATA:   Shortness of breath.  Hypoxia EXAM: PORTABLE CHEST 1 VIEW COMPARISON:  Chest x-ray 08/08/2022 FINDINGS: Small bilateral pleural effusions with some adjacent opacities. Right-greater-than-left. Atelectasis versus infiltrate. Recommend follow-up. No pneumothorax. Enlarged cardiopericardial silhouette. Peribronchial thickening. Overlapping cardiac leads. Film is under penetrated degenerative changes of the shoulders. IMPRESSION: Small pleural effusions at the bases with some adjacent opacities, right-greater-than-left with peribronchial thickening. Recommend follow-up. Electronically Signed   By: Jill Side M.D.   On: 01/25/2023 13:26    Cardiac Studies  LHC 07/14/2020 Prox RCA lesion is 40% stenosed. Mid RCA to Dist RCA lesion is 10% stenosed. Prox RCA to Mid RCA lesion is 50% stenosed. Prox Cx to Mid Cx lesion is 40% stenosed. Prox LAD to Mid LAD lesion is 20% stenosed.   1. Mild non-obstructive plaque in the mid LAD 2. Mild non-obstructive plaque in the mid Circumflex 3. The RCA is a large dominant vessel with moderate proximal and mid stenosis that does not appear to be flow limiting. Patent distal stented segment with minimal restenosis.   Patient Profile  Ian Harrison is a 89 y.o. male with diabetes, CAD, dementia, HFpEF who was admitted on 01/25/2023 for acute congestive heart failure.  Concerns for reduction in LVEF.  Assessment & Plan   # Acute likely systolic heart failure -Admitted with volume overload.  BNP severely elevated.  Known ejection fraction normal in the past.  BNP 2000.  Suspect his EF is reduced.  Echo is pending. -History of nonobstructive CAD based on cath in 2021. -Currently with dementia.  Not a good candidate for invasive procedures.  DNR/DNI noted. -Blood pressures are soft.  For now we will continue with a lenient approach.  I have added digoxin as a one-time dose.  We can see how he does with this.  If his ejection fraction is severely reduced may need to have a  discussion with family about goals of care.  He is currently DNR/DNI.  Unclear how he would benefit from aggressive cardiovascular care. -Hold GDMT. -We will attempt Lasix 60 mg IV twice daily.  Will discuss with his daughter goals of care.  # Elevated troponin, demand -No chest pain.  Troponins are elevated and flat.  EKG with minimal  ST depressions.  Suspect this is demand.  Left heart cath in 2021 was nonobstructive.  His BNP is 2165.  Undoubtedly this is demand. -Okay to continue aspirin and Plavix.  Would hold heparin.  On a statin.  Lipids are at goal.  # Dementia -At baseline per reports  For questions or updates, please contact [place] Please consult [email or web address] for contact info under     Signed, [place] T. [name], MD, [place]  01/26/2023 9:04 AM

## 2023-01-26 NOTE — TOC Progression Note (Signed)
Transition of Care [facility]) - Progression Note    Patient Details  Name: Ian Harrison MRN: [id number] Date of Birth: 1926-11-22  Transition of [facility]) CM/SW Contact  Zenon Mayo, RN Phone Number: 01/26/2023, 2:55 PM  Clinical Narrative:    Per Palliative, family wants home with hospice with [facility], NCM made referral with Ramiro Harvest , she will contact the daughter [name] at [phone].  Patient will need ambulance transport at dc.         Expected Discharge Plan and Services                                               Social Determinants of Health (SDOH) Interventions SDOH Screenings   Food Insecurity: No Food Insecurity (08/09/2022)  Housing: Low Risk  (07/14/2021)  Transportation Needs: No Transportation Needs (08/09/2022)  Utilities: Not At Risk (08/09/2022)  Alcohol Screen: Low Risk  (08/09/2022)  Depression (PHQ2-9): Low Risk  (07/14/2021)  Financial Resource Strain: Low Risk  (08/09/2022)  Physical Activity: Inactive (08/09/2022)  Social Connections: Moderately Isolated (07/14/2021)  Stress: No Stress Concern Present (07/14/2021)  Tobacco Use: Medium Risk (01/25/2023)    Readmission Risk Interventions     No data to display

## 2023-01-26 NOTE — Progress Notes (Signed)
Heart Failure Navigator Progress Note  Assessed for Heart & Vascular TOC clinic readiness.  Patient does not meet criteria due to History of dementia, EF 60-65%, active with Palliative Care. .   Navigator will sign off at this time.  Earnestine Leys, BSN, Clinical cytogeneticist Only

## 2023-01-26 NOTE — Assessment & Plan Note (Signed)
-   Iron studies showed low iron that could be contributing. - The patient has macrocytosis and vitamin B12 was normal and folate is currently pending. - We will follow H&H and obtain stool Hemoccult.

## 2023-01-26 NOTE — Progress Notes (Signed)
Echocardiogram 2D Echocardiogram has been performed.  Frances Furbish 01/26/2023, 9:44 AM

## 2023-01-26 NOTE — Consult Note (Signed)
Consultation Note Date: 01/26/2023   Patient Name: Ian Harrison  DOB: May 22, 1927  MRN: [id number]  Age / Sex: 89 y.o., male  PCP: Elise Benne Referring Physician: Elodia Florence., *  Reason for Consultation: Establishing goals of care  HPI/Patient Profile: 89 y.o. male  with past medical history of dementia, diastolic CHF, type diabetes mellitus, hypertension, coronary artery disease, dyslipidemia admitted on 01/25/2023 with shortness of breath with acute on chronic systolic and diastolic CHF.   Clinical Assessment and Goals of Care: Consult received and chart review completed. I discussed with RN and then met with daughter, [name], at Ian Harrison bedside. [name] is sleepy and underlying confusion due to dementia. [name] and I spoke privately. We reviewed ECHO result and limitations of treatment even with cardiac medications due to hypotension. We reviewed the poor prognosis and difficult journey ahead with dementia and now severe heart decline. Wife has already been struggling to care at home due to dementia. They were planning palliative involvement outpatient via [facility]. I spoke more with [name] and they seem more inclined to a path of keeping Ian Harrison happy and comfortable at home. Wife has told family she wishes to bring him back home. They are planning to increase private caregiver support in the home. We discussed outpatient palliative vs hospice support and I recommended that hospice may be more aligned with their goals and needs. [name] is receptive and agrees with hospice support at home.   Daughter, [name], called and I spoke with her and informed her of the above conversation and she also agrees. They plan to speak with their mother about having hospice at home but they believe she will also agree.   We will consult PT to evaluate Mr. Saladin and help identify  expectations and needs at home as he was previously ambulating. I anticipate he may likely need oxygen at home which will also make ambulation more difficult and more risk for falls.   All questions/concerns addressed. Emotional support provided. Updated Dr. [name], Dr. [name], and TOC.   Primary Decision Maker NEXT OF KIN wife [name]    SUMMARY OF RECOMMENDATIONS   - DNR in place - Anticipate home with hospice support via [place] unless wife voices objection  Code Status/Advance Care Planning: DNR   Symptom Management:  Insomnia: Trazodone 25 mg po qhs. May increase dose as needed. May consider Seroquel. AVOID BENZOS as will add to increased overall confusion.   Prognosis:  < 6 months very likely with declining heart function and underlying dementia.   Discharge Planning: Home with Hospice      Primary Diagnoses: Present on Admission:  Acute on chronic combined systolic and diastolic CHF (congestive heart failure)   I have reviewed the medical record, interviewed the patient and family, and examined the patient. The following aspects are pertinent.  Past Medical History:  Diagnosis Date   AAA (abdominal aortic aneurysm) without rupture 09/03/2019   Acute lower UTI 07/10/2020   Aortic aneurysm    Arteriosclerosis  of coronary artery 09/03/2019   CAD (coronary artery disease)    Cancer    Skin cancer   Chest pain 07/10/2020   Chronic diastolic CHF (congestive heart failure) 07/10/2020   Chronic kidney disease    Coronary artery disease involving native coronary artery of native heart without angina pectoris 09/03/2019   Decreased activities of daily living (ADL) 09/09/2022   Dementia    Dementia without behavioral disturbance 09/24/2019   Diabetes mellitus type II, non insulin dependent    Dyslipidemia    Intolerance to atorvastatin due to myalgias   Elevated troponin    Essential hypertension 09/03/2019   Generalized weakness 07/10/2020    Hyperkalemia 07/10/2020   Myocardial infarction    15 years ago   Stable angina pectoris 02/01/2022   Vasovagal syncope    Without recurrence   Social History   Socioeconomic History   Marital status: Married    Spouse name: Not on file   Number of children: Not on file   Years of education: Not on file   Highest education level: Not on file  Occupational History   Not on file  Tobacco Use   Smoking status: Former   Smokeless tobacco: Never   Tobacco comments:    Quit 17 years ago  Vaping Use   Vaping Use: Never used  Substance and Sexual Activity   Alcohol use: Not Currently   Drug use: Never   Sexual activity: Not on file  Other Topics Concern   Not on file  Social History Narrative   Not on file   Social Determinants of Health   Financial Resource Strain: Low Risk  (08/09/2022)   Overall Financial Resource Strain (CARDIA)    Difficulty of Paying Living Expenses: Not hard at all  Food Insecurity: No Food Insecurity (08/09/2022)   Hunger Vital Sign    Worried About Running Out of Food in the Last Year: Never true    Ran Out of Food in the Last Year: Never true  Transportation Needs: No Transportation Needs (08/09/2022)   PRAPARE - Hydrologist (Medical): No    Lack of Transportation (Non-Medical): No  Physical Activity: Inactive (08/09/2022)   Exercise Vital Sign    Days of Exercise per Week: 0 days    Minutes of Exercise per Session: 0 min  Stress: No Stress Concern Present (07/14/2021)   [place]    Feeling of Stress : Not at all  Social Connections: Moderately Isolated (07/14/2021)   Social Connection and Isolation Panel [NHANES]    Frequency of Communication with Friends and Family: More than three times a week    Frequency of Social Gatherings with Friends and Family: More than three times a week    Attends Religious Services: Never    Marine scientist or  Organizations: No    Attends Music therapist: Never    Marital Status: Married   Family History  Problem Relation Age of Onset   Heart attack Brother    Scheduled Meds:  aspirin EC  81 mg Oral Q breakfast   clopidogrel  75 mg Oral Daily   vitamin B-12  1,000 mcg Oral Daily   digoxin  0.125 mg Oral Daily   enoxaparin (LOVENOX) injection  40 mg Subcutaneous Q24H   ferrous sulfate  325 mg Oral QODAY   furosemide  60 mg Intravenous BID   ranolazine  500 mg Oral BID  rosuvastatin  20 mg Oral Daily   tamsulosin  0.4 mg Oral BID   Continuous Infusions: PRN Meds:.acetaminophen, magnesium hydroxide, nitroGLYCERIN, ondansetron (ZOFRAN) IV, traZODone Allergies  Allergen Reactions   Atorvastatin Other (See Comments)    myalgia   Morphine And Related Other (See Comments)    Mood changes (anxious)   Review of Systems  Unable to perform ROS: Dementia    Physical Exam Vitals and nursing note reviewed.  Constitutional:      General: He is not in acute distress.    Appearance: He is ill-appearing.  Cardiovascular:     Rate and Rhythm: Normal rate.  Pulmonary:     Effort: No tachypnea, accessory muscle usage or respiratory distress.  Abdominal:     Palpations: Abdomen is soft.  Musculoskeletal:     Right lower leg: Edema present.     Left lower leg: Edema present.  Neurological:     Mental Status: He is alert. He is confused.     Vital Signs: BP (!) 90/52 (BP Location: Left Arm)   Pulse 83   Temp 97.8 F (36.6 C) (Oral)   Resp 18   Ht 6\' 1"  (1.854 m)   Wt 73.2 kg   SpO2 100%   BMI 21.29 kg/m  Pain Scale: 0-10   Pain Score: 0-No pain   SpO2: SpO2: 100 % O2 Device:SpO2: 100 % O2 Flow Rate: .O2 Flow Rate (L/min): 3 L/min  IO: Intake/output summary:  Intake/Output Summary (Last 24 hours) at 01/26/2023 1203 Last data filed at 01/25/2023 2001 Gross per 24 hour  Intake --  Output 400 ml  Net -400 ml    LBM: Last BM Date : 01/24/23 Baseline Weight:  Weight: 73.5 kg Most recent weight: Weight: 73.2 kg     Palliative Assessment/Data:     Time In: 1205  Time Total: 80 min  Greater than 50%  of this time was spent counseling and coordinating care related to the above assessment and plan.  Signed by: Vinie Sill, NP Palliative Medicine Team Pager # [phone] (M-F 8a-5p) Team Phone # [phone] (Nights/Weekends)

## 2023-01-26 NOTE — Assessment & Plan Note (Addendum)
-   He will be placed on as needed IM Haldol and IV Ativan for agitation and restlessness.

## 2023-01-26 NOTE — Assessment & Plan Note (Signed)
-   We will continue his p.o. Flomax.

## 2023-01-26 NOTE — Assessment & Plan Note (Addendum)
-   The patient was directly admitted to telemetry bed. - Will continue diuresis with IV Lasix. - 2D echo in 2020 showed EF of 60 to 65% with grade 1 diastolic dysfunction. - We will follow serial troponins. - 2D echo and cardiology consult will be obtained as mentioned above.

## 2023-01-26 NOTE — Progress Notes (Signed)
Patient confused and trying to get out of bed. Very agitated. Order received for Ativan IV. Ativan effective.

## 2023-01-26 NOTE — Progress Notes (Signed)
Patient arrived from [facility] via [facility]. Patient alert to self and situation only. VSS. Patient has subaceous cyst to right lower chest. Patient pleasant but keeps asking same questions. Daughter at bedside said that he is not normally this confused.

## 2023-01-26 NOTE — Assessment & Plan Note (Addendum)
-   The patient was directly admitted to a medical telemetry bed. - We will place him on IV heparin. - He will be continued on aspirin and Plavix and placed on as needed sublingual nitroglycerin and IV morphine sulfate for pain. - High-dose statin will be given with Crestor as he had myalgia before with Lipitor. - We will continue Ranexa. - 2D echo and cardiology consult will be obtained. - I notified Dr. Humphrey Rolls about the patient.

## 2023-01-26 NOTE — Progress Notes (Signed)
PROGRESS NOTE    Ian Harrison  [id number] DOB: Jun 16, 1927 DOA: 01/25/2023 PCP: Mackie Pai, PA-C  Chief Complaint  Patient presents with   Shortness of Breath    Brief Narrative:   Ian Harrison is Ian Harrison 89 y.o. Caucasian male with medical history significant for diastolic CHF, type diabetes mellitus, hypertension, coronary artery disease, dyslipidemia and dementia, who presented to the emergency room with acute onset of dyspnea mainly on exertion as well as worsening lower extremity edema.   Currently being treated for Ian Harrison HF exacerbation.  Assessment & Plan:   Principal Problem:   Acute on chronic combined systolic and diastolic CHF (congestive heart failure) Active Problems:   NSTEMI (non-ST elevated myocardial infarction)   Dementia with behavioral disturbance   Anemia, unspecified   BPH (benign prostatic hyperplasia)  Acute on chronic combined systolic and diastolic CHF (congestive heart failure) -- CXR with small pleural effusions with adjacent opacities, R>L peribronchial thickening - SBP soft this AM (currently 90's, earlier had SBP recorded in 70's) -- will hold lasix, spironolactone for now until seen by cards, discussed with RN - 2D echo in 2020 showed EF of 60 to 65% with grade 1 diastolic dysfunction. - awaiting repeat echo -- cards c/s -- strict I/O, daily weights   NSTEMI (non-ST elevated myocardial infarction) - cards c/s, appreciate recs -- suspected demand ischemia in setting of HF exacerbation -- continue DAPT, statin, ranexa -- appreciate additional cards recs -- echo pending    Dementia with behavioral disturbance - delirium precautions -- seems about his baseline per discussion with daughter   Macrocytic Anemia - anemia of chronic disease with iron def? Also low normal B12. -- supplement B12 and iron  -- trend H/H   BPH (benign prostatic hyperplasia) - We will continue his p.o. Flomax.   Goals of care -- palliative care c/s,  appreciate recs     DVT prophylaxis: lovenox Code Status: DNR/DNI Family Communication: daughter 4/3 Disposition:   Status is: Inpatient Remains inpatient appropriate because: need for diuresis, cards eval    Consultants:  cardiology  Procedures:  none  Antimicrobials:  Anti-infectives (From admission, onward)    None       Subjective: Pleasantly confused Says it's early  Too early to talk about whether he has CP or SOB or LH/dizziness (he doesn't complain of any of these, but can't answer directly) Daughter notes dementia and that this isn't too far from his baseline  Objective: Vitals:   01/25/23 2057 01/26/23 0008 01/26/23 0608 01/26/23 0828  BP: 106/69 102/67 97/66 95/60   Pulse: 96 81 91 91  Resp: 20 17  18   Temp: 97.7 F (36.5 C)  97.8 F (36.6 C) 97.8 F (36.6 C)  TempSrc: Oral  Oral Oral  SpO2: 100% 100% 97% 100%  Weight: 73.6 kg  73.2 kg   Height: 6\' 1"  (1.854 m)       Intake/Output Summary (Last 24 hours) at 01/26/2023 0846 Last data filed at 01/25/2023 2001 Gross per 24 hour  Intake --  Output 400 ml  Net -400 ml   Filed Weights   01/25/23 1259 01/25/23 2057 01/26/23 0608  Weight: 73.5 kg 73.6 kg 73.2 kg    Examination:  General exam: Appears calm and comfortable  Respiratory system: diminished Cardiovascular system: RRR, sinus on tele Gastrointestinal system: Abdomen is nondistended, soft and nontender.  Central nervous system: Alert, pleasantly confused. No focal neurological deficits. Extremities: trace LE edema, no hip edema    Data Reviewed: I have personally  reviewed following labs and imaging studies  CBC: Recent Labs  Lab 01/25/23 1304 01/26/23 0608  WBC 6.1 6.3  HGB 8.3* 8.5*  HCT 25.3* 25.5*  MCV 114.0* 112.8*  PLT 210 [id number]    Basic Metabolic Panel: Recent Labs  Lab 01/25/23 1304 01/26/23 0608  NA 132* 138  K 4.1 3.9  CL 99 98  CO2 20* 23  GLUCOSE 162* 138*  BUN 35* 34*  CREATININE 1.04 1.20  CALCIUM 9.0  8.7*    GFR: Estimated Creatinine Clearance: 39.8 mL/min (by C-G formula based on SCr of 1.2 mg/dL).  Liver Function Tests: No results for input(s): "AST", "ALT", "ALKPHOS", "BILITOT", "PROT", "ALBUMIN" in the last 168 hours.  CBG: No results for input(s): "GLUCAP" in the last 168 hours.   No results found for this or any previous visit (from the past 240 hour(s)).       Radiology Studies: DG Chest Portable 1 View  Result Date: 01/25/2023 CLINICAL DATA:  Shortness of breath.  Hypoxia EXAM: PORTABLE CHEST 1 VIEW COMPARISON:  Chest x-ray 08/08/2022 FINDINGS: Small bilateral pleural effusions with some adjacent opacities. Right-greater-than-left. Atelectasis versus infiltrate. Recommend follow-up. No pneumothorax. Enlarged cardiopericardial silhouette. Peribronchial thickening. Overlapping cardiac leads. Film is under penetrated degenerative changes of the shoulders. IMPRESSION: Small pleural effusions at the bases with some adjacent opacities, right-greater-than-left with peribronchial thickening. Recommend follow-up. Electronically Signed   By: Jill Side M.D.   On: 01/25/2023 13:26        Scheduled Meds:  aspirin EC  81 mg Oral Q breakfast   clopidogrel  75 mg Oral Daily   enoxaparin (LOVENOX) injection  40 mg Subcutaneous Q24H   furosemide  60 mg Intravenous BID   ranolazine  500 mg Oral BID   rosuvastatin  20 mg Oral Daily   tamsulosin  0.4 mg Oral BID   Continuous Infusions:   LOS: 1 day    Time spent: over 30 min    Ian Helper, MD [facility]   To contact the attending provider between 7A-7P or the covering provider during after hours 7P-7A, please log into the web site [email or web address] and access using universal [place] password for that web site. If you do not have the password, please call the hospital operator.  01/26/2023, 8:46 AM

## 2023-01-26 NOTE — Plan of Care (Signed)

## 2023-01-27 DIAGNOSIS — I5043 Acute on chronic combined systolic (congestive) and diastolic (congestive) heart failure: Secondary | ICD-10-CM | POA: Diagnosis not present

## 2023-01-27 LAB — COMPREHENSIVE METABOLIC PANEL
ALT: 27 U/L (ref 0–44)
AST: 24 U/L (ref 15–41)
Albumin: 3.2 g/dL — ABNORMAL LOW (ref 3.5–5.0)
Alkaline Phosphatase: 45 U/L (ref 38–126)
Anion gap: 7 (ref 5–15)
BUN: 42 mg/dL — ABNORMAL HIGH (ref 8–23)
CO2: 26 mmol/L (ref 22–32)
Calcium: 8.3 mg/dL — ABNORMAL LOW (ref 8.9–10.3)
Chloride: 102 mmol/L (ref 98–111)
Creatinine, Ser: 1.1 mg/dL (ref 0.61–1.24)
GFR, Estimated: >60 mL/min (ref >60)
Glucose, Bld: 130 mg/dL — ABNORMAL HIGH (ref 70–99)
Potassium: 4.2 mmol/L (ref 3.5–5.1)
Sodium: 135 mmol/L (ref 135–145)
Total Bilirubin: 1.1 mg/dL (ref 0.3–1.2)
Total Protein: 5.7 g/dL — ABNORMAL LOW (ref 6.5–8.1)

## 2023-01-27 LAB — CBC WITH DIFFERENTIAL/PLATELET
Abs Immature Granulocytes: 0.33 10*3/uL — ABNORMAL HIGH (ref 0.00–0.07)
Basophils Absolute: 0 10*3/uL (ref 0.0–0.1)
Basophils Relative: 1 %
Eosinophils Absolute: 0 10*3/uL (ref 0.0–0.5)
Eosinophils Relative: 1 %
HCT: 23.2 % — ABNORMAL LOW (ref 39.0–52.0)
Hemoglobin: 8.2 g/dL — ABNORMAL LOW (ref 13.0–17.0)
Immature Granulocytes: 5 %
Lymphocytes Relative: 21 %
Lymphs Abs: 1.4 10*3/uL (ref 0.7–4.0)
MCH: 38.7 pg — ABNORMAL HIGH (ref 26.0–34.0)
MCHC: 35.3 g/dL (ref 30.0–36.0)
MCV: 109.4 fL — ABNORMAL HIGH (ref 80.0–100.0)
Monocytes Absolute: 0.7 10*3/uL (ref 0.1–1.0)
Monocytes Relative: 11 %
Neutro Abs: 4 10*3/uL (ref 1.7–7.7)
Neutrophils Relative %: 61 %
Platelets: 187 10*3/uL (ref 150–400)
RBC: 2.12 MIL/uL — ABNORMAL LOW (ref 4.22–5.81)
RDW: 16.3 % — ABNORMAL HIGH (ref 11.5–15.5)
WBC: 6.5 10*3/uL (ref 4.0–10.5)
nRBC: 2.3 % — ABNORMAL HIGH (ref 0.0–0.2)

## 2023-01-27 LAB — MAGNESIUM: Magnesium: 2.5 mg/dL — ABNORMAL HIGH (ref 1.7–2.4)

## 2023-01-27 LAB — PHOSPHORUS: Phosphorus: 4.1 mg/dL (ref 2.5–4.6)

## 2023-01-27 MED ORDER — FUROSEMIDE 10 MG/ML IJ SOLN
80.0000 mg | Freq: Two times a day (BID) | INTRAMUSCULAR | Status: DC
  Filled 2023-01-27: qty 8

## 2023-01-27 MED ORDER — SPIRONOLACTONE 12.5 MG HALF TABLET
12.5000 mg | ORAL_TABLET | Freq: Every day | ORAL | Status: DC
  Administered 2023-01-27 – 2023-01-28 (×2): 12.5 mg via ORAL
  Filled 2023-01-27 (×2): qty 1

## 2023-01-27 MED ORDER — FUROSEMIDE 10 MG/ML IJ SOLN
60.0000 mg | Freq: Two times a day (BID) | INTRAMUSCULAR | Status: DC
  Administered 2023-01-27 – 2023-01-28 (×3): 60 mg via INTRAVENOUS
  Filled 2023-01-27 (×3): qty 6

## 2023-01-27 NOTE — Progress Notes (Signed)
Cardiology Progress Note  Patient ID: Ian Harrison MRN: [id number] DOB: 05/30/1927 Date of Encounter: 01/27/2023  Primary Cardiologist: None  Subjective   Chief Complaint: Shortness of breath  HPI: EF severely reduced.  Plan is for hospice tomorrow.  Did not receive diuretics yesterday.  ROS:  All other ROS reviewed and negative. Pertinent positives noted in the HPI.     Inpatient Medications  Scheduled Meds:  aspirin EC  81 mg Oral Q breakfast   clopidogrel  75 mg Oral Daily   vitamin B-12  1,000 mcg Oral Daily   digoxin  0.125 mg Oral Daily   enoxaparin (LOVENOX) injection  40 mg Subcutaneous Q24H   ferrous sulfate  325 mg Oral QODAY   furosemide  80 mg Intravenous BID   spironolactone  12.5 mg Oral Daily   tamsulosin  0.4 mg Oral QPC breakfast   traZODone  25 mg Oral QHS   Continuous Infusions:  PRN Meds: acetaminophen, alum & mag hydroxide-simeth, magnesium hydroxide, nitroGLYCERIN, ondansetron (ZOFRAN) IV   Vital Signs   Vitals:   01/26/23 1943 01/27/23 0025 01/27/23 0356 01/27/23 0726  BP: 101/67 101/73 102/78 93/62  Pulse: 87 89  85  Resp: 15 19 18 19   Temp: 98 F (36.7 C) 98.1 F (36.7 C) 97.7 F (36.5 C) (!) 97.5 F (36.4 C)  TempSrc: Oral Oral Oral Oral  SpO2: 98% 94%  98%  Weight:   72.3 kg   Height:        Intake/Output Summary (Last 24 hours) at 01/27/2023 1042 Last data filed at 01/27/2023 0840 Gross per 24 hour  Intake 720 ml  Output 400 ml  Net 320 ml      01/27/2023    3:56 AM 01/26/2023    6:08 AM 01/25/2023    8:57 PM  Last 3 Weights  Weight (lbs) 159 lb 6.3 oz 161 lb 6 oz 162 lb 4.1 oz  Weight (kg) 72.3 kg 73.2 kg 73.6 kg      Telemetry  Overnight telemetry shows sinus rhythm 80s, brief A. tach, which I personally reviewed.    Physical Exam   Vitals:   01/26/23 1943 01/27/23 0025 01/27/23 0356 01/27/23 0726  BP: 101/67 101/73 102/78 93/62  Pulse: 87 89  85  Resp: 15 19 18 19   Temp: 98 F (36.7 C) 98.1 F (36.7 C) 97.7 F  (36.5 C) (!) 97.5 F (36.4 C)  TempSrc: Oral Oral Oral Oral  SpO2: 98% 94%  98%  Weight:   72.3 kg   Height:        Intake/Output Summary (Last 24 hours) at 01/27/2023 1042 Last data filed at 01/27/2023 0840 Gross per 24 hour  Intake 720 ml  Output 400 ml  Net 320 ml       01/27/2023    3:56 AM 01/26/2023    6:08 AM 01/25/2023    8:57 PM  Last 3 Weights  Weight (lbs) 159 lb 6.3 oz 161 lb 6 oz 162 lb 4.1 oz  Weight (kg) 72.3 kg 73.2 kg 73.6 kg    Body mass index is 21.03 kg/m.  General: Well nourished, well developed, in no acute distress Head: Atraumatic, normal size  Eyes: PEERLA, EOMI  Neck: Supple, JVD 10-12 cmH2O Endocrine: No thryomegaly Cardiac: Normal S1, S2; RRR; no murmurs, rubs, or gallops Lungs: Crackles at the lung bases Abd: Soft, nontender, no hepatomegaly  Ext: 1+ pitting edema Musculoskeletal: No deformities, BUE and BLE strength normal and equal Skin: Warm and dry, no  rashes   Neuro: Alert, awake, oriented to person and place only  Labs  High Sensitivity Troponin:   Recent Labs  Lab 01/25/23 1304 01/25/23 1504  TROPONINIHS 943* 981*     Cardiac EnzymesNo results for input(s): "TROPONINI" in the last 168 hours. No results for input(s): "TROPIPOC" in the last 168 hours.  Chemistry Recent Labs  Lab 01/25/23 1304 01/26/23 0608 01/27/23 0027  NA 132* 138 135  K 4.1 3.9 4.2  CL 99 98 102  CO2 20* 23 26  GLUCOSE 162* 138* 130*  BUN 35* 34* 42*  CREATININE 1.04 1.20 1.10  CALCIUM 9.0 8.7* 8.3*  PROT  --   --  5.7*  ALBUMIN  --   --  3.2*  AST  --   --  24  ALT  --   --  27  ALKPHOS  --   --  45  BILITOT  --   --  1.1  GFRNONAA >60 56* >60  ANIONGAP 13 17* 7    Hematology Recent Labs  Lab 01/25/23 1304 01/25/23 1444 01/26/23 0608 01/27/23 0027  WBC 6.1  --  6.3 6.5  RBC 2.22* 2.22* 2.26* 2.12*  HGB 8.3*  --  8.5* 8.2*  HCT 25.3*  --  25.5* 23.2*  MCV 114.0*  --  112.8* 109.4*  MCH 37.4*  --  37.6* 38.7*  MCHC 32.8  --  33.3 35.3  RDW  16.3*  --  16.3* 16.3*  PLT 210  --  202 187   BNP Recent Labs  Lab 01/25/23 1341  BNP 2,165.9*    DDimer No results for input(s): "DDIMER" in the last 168 hours.   Radiology  ECHOCARDIOGRAM COMPLETE  Result Date: 01/26/2023    ECHOCARDIOGRAM REPORT   Patient Name:   Ian Harrison Date of Exam: 01/26/2023 Medical Rec #:  [id number]        Height:       73.0 in Accession #:    [id number]       Weight:       161.4 lb Date of Birth:  23-Dec-1926        BSA:          1.964 m Patient Age:    89 years         BP:           95/60 mmHg Patient Gender: M                HR:           73 bpm. Exam Location:  Inpatient Procedure: 2D Echo, Color Doppler and Cardiac Doppler Indications:    NSTEMI  History:        Patient has prior history of Echocardiogram examinations. CHF,                 CAD and Previous Myocardial Infarction; Risk                 Factors:Hypertension and Diabetes.  Sonographer:    Phineas Douglas Referring Phys: [id number] [employer]  1. Left ventricular ejection fraction, by estimation, is 25 to 30%. The left ventricle has severely decreased function. The left ventricle demonstrates global hypokinesis. Left ventricular diastolic parameters are consistent with Grade III diastolic dysfunction (restrictive). Elevated left atrial pressure.  2. Right ventricular systolic function is mildly reduced. The right ventricular size is mildly enlarged. There is moderately elevated pulmonary artery systolic pressure. The estimated right ventricular systolic pressure is 52.5  mmHg.  3. Left atrial size was severely dilated.  4. Right atrial size was moderately dilated.  5. The mitral valve is normal in structure. Mild to moderate mitral valve regurgitation.  6. The aortic valve is tricuspid. There is moderate calcification of the aortic valve. There is moderate thickening of the aortic valve. Aortic valve regurgitation is mild. Aortic valve sclerosis/calcification is present, without any evidence of  aortic stenosis.  7. The inferior vena cava is dilated in size with <50% respiratory variability, suggesting right atrial pressure of 15 mmHg. Comparison(s): The left ventricular function is significantly worse. The left ventricular diastolic function is significantly worse. FINDINGS  Left Ventricle: Left ventricular ejection fraction, by estimation, is 25 to 30%. The left ventricle has severely decreased function. The left ventricle demonstrates global hypokinesis. The left ventricular internal cavity size was normal in size. There is no left ventricular hypertrophy. Left ventricular diastolic parameters are consistent with Grade III diastolic dysfunction (restrictive). Elevated left atrial pressure. Right Ventricle: The right ventricular size is mildly enlarged. No increase in right ventricular wall thickness. Right ventricular systolic function is mildly reduced. There is moderately elevated pulmonary artery systolic pressure. The tricuspid regurgitant velocity is 3.06 m/s, and with an assumed right atrial pressure of 15 mmHg, the estimated right ventricular systolic pressure is 0000000 mmHg. Left Atrium: Left atrial size was severely dilated. Right Atrium: Right atrial size was moderately dilated. Pericardium: There is no evidence of pericardial effusion. Mitral Valve: The mitral valve is normal in structure. Mild to moderate mitral valve regurgitation, with centrally-directed jet. Tricuspid Valve: The tricuspid valve is normal in structure. Tricuspid valve regurgitation is mild. Aortic Valve: Immobile right coronary cusp. The aortic valve is tricuspid. There is moderate calcification of the aortic valve. There is moderate thickening of the aortic valve. Aortic valve regurgitation is mild. Aortic regurgitation PHT measures 364 msec. Aortic valve sclerosis/calcification is present, without any evidence of aortic stenosis. Aortic valve mean gradient measures 4.7 mmHg. Aortic valve peak gradient measures 9.3 mmHg.  Aortic valve area, by VTI measures 2.22 cm. Pulmonic Valve: The pulmonic valve was grossly normal. Pulmonic valve regurgitation is mild. Aorta: The aortic root and ascending aorta are structurally normal, with no evidence of dilitation. Venous: The inferior vena cava is dilated in size with less than 50% respiratory variability, suggesting right atrial pressure of 15 mmHg. IAS/Shunts: No atrial level shunt detected by color flow Doppler.  LEFT VENTRICLE PLAX 2D LVIDd:         4.70 cm      Diastology LVIDs:         3.70 cm      LV e' medial:    3.73 cm/s LV PW:         0.90 cm      LV E/e' medial:  25.5 LV IVS:        1.00 cm      LV e' lateral:   6.32 cm/s LVOT diam:     2.20 cm      LV E/e' lateral: 15.0 LV SV:         56 LV SV Index:   29 LVOT Area:     3.80 cm  LV Volumes (MOD) LV vol d, MOD A2C: 135.0 ml LV vol d, MOD A4C: 134.0 ml LV vol s, MOD A2C: 95.2 ml LV vol s, MOD A4C: 98.5 ml LV SV MOD A2C:     39.8 ml LV SV MOD A4C:     134.0 ml LV SV  MOD BP:      37.9 ml RIGHT VENTRICLE             IVC RV Basal diam:  4.80 cm     IVC diam: 2.60 cm RV S prime:     10.10 cm/s TAPSE (M-mode): 1.9 cm LEFT ATRIUM             Index        RIGHT ATRIUM           Index LA diam:        4.10 cm 2.09 cm/m   RA Area:     21.00 cm LA Vol (A2C):   83.8 ml 42.67 ml/m  RA Volume:   63.70 ml  32.43 ml/m LA Vol (A4C):   71.1 ml 36.20 ml/m LA Biplane Vol: 85.8 ml 43.68 ml/m  AORTIC VALVE AV Area (Vmax):    1.93 cm AV Area (Vmean):   2.20 cm AV Area (VTI):     2.22 cm AV Vmax:           152.33 cm/s AV Vmean:          100.055 cm/s AV VTI:            0.254 m AV Peak Grad:      9.3 mmHg AV Mean Grad:      4.7 mmHg LVOT Vmax:         77.50 cm/s LVOT Vmean:        58.000 cm/s LVOT VTI:          0.148 m LVOT/AV VTI ratio: 0.58 AI PHT:            364 msec  AORTA Ao Root diam: 3.50 cm Ao Asc diam:  3.60 cm MITRAL VALVE                  TRICUSPID VALVE MV Area (PHT): 4.71 cm       TR Peak grad:   37.5 mmHg MV Decel Time: 161 msec        TR Vmax:        306.00 cm/s MR Peak grad:    72.2 mmHg MR Mean grad:    44.0 mmHg    SHUNTS MR Vmax:         425.00 cm/s  Systemic VTI:  0.15 m MR Vmean:        309.0 cm/s   Systemic Diam: 2.20 cm MR PISA:         1.01 cm MR PISA Eff ROA: 8 mm MR PISA Radius:  0.40 cm MV E velocity: 95.10 cm/s MV A velocity: 47.60 cm/s MV E/A ratio:  2.00 Mihai Croitoru MD Electronically signed by Sanda Klein MD Signature Date/Time: 01/26/2023/11:33:13 AM    Final    DG Chest Portable 1 View  Result Date: 01/25/2023 CLINICAL DATA:  Shortness of breath.  Hypoxia EXAM: PORTABLE CHEST 1 VIEW COMPARISON:  Chest x-ray 08/08/2022 FINDINGS: Small bilateral pleural effusions with some adjacent opacities. Right-greater-than-left. Atelectasis versus infiltrate. Recommend follow-up. No pneumothorax. Enlarged cardiopericardial silhouette. Peribronchial thickening. Overlapping cardiac leads. Film is under penetrated degenerative changes of the shoulders. IMPRESSION: Small pleural effusions at the bases with some adjacent opacities, right-greater-than-left with peribronchial thickening. Recommend follow-up. Electronically Signed   By: Jill Side M.D.   On: 01/25/2023 13:26    Cardiac Studies  TTE 01/26/2023  1. Left ventricular ejection fraction, by estimation, is 25 to 30%. The  left ventricle has severely decreased function. The left ventricle  demonstrates global hypokinesis. Left ventricular diastolic parameters are  consistent with Grade III diastolic  dysfunction (restrictive). Elevated left atrial pressure.   2. Right ventricular systolic function is mildly reduced. The right  ventricular size is mildly enlarged. There is moderately elevated  pulmonary artery systolic pressure. The estimated right ventricular  systolic pressure is 0000000 mmHg.   3. Left atrial size was severely dilated.   4. Right atrial size was moderately dilated.   5. The mitral valve is normal in structure. Mild to moderate mitral valve   regurgitation.   6. The aortic valve is tricuspid. There is moderate calcification of the  aortic valve. There is moderate thickening of the aortic valve. Aortic  valve regurgitation is mild. Aortic valve sclerosis/calcification is  present, without any evidence of aortic  stenosis.   7. The inferior vena cava is dilated in size with <50% respiratory  variability, suggesting right atrial pressure of 15 mmHg.   Patient Profile  Ian Harrison is a 89 y.o. male with diabetes, CAD, dementia, HFpEF who was admitted on 01/25/2023 for acute congestive heart failure.  Concerns for reduction in LVEF.   Assessment & Plan   # Acute systolic heart failure, EF 25-30% -Prior history of nonobstructive CAD.  Also with preserved EF.  Now here with severely reduced LV function and volume overload. -He has dementia.  He is 89 years of age.  Family would like a very conservative approach.  Currently DNR/DNI.  Family discussion yesterday.  Plan is for transition to hospice at home.  They do not want aggressive cardiovascular care nor is it indicated. -Today we will give him IV Lasix.  Did not get it yesterday.  60 mg IV twice daily. -Pressure marginal.  He is on digoxin 0.125 mg daily.  Okay to continue this.  No beta-blocker given hypotension.  Do not believe he will tolerate other GDMT.  Again our approach is palliative in this elderly gentleman with advanced systolic dysfunction. -For diuresis today, can transition to oral Lasix 40 mg daily tomorrow.  Discussed this with the hospital team.  # Troponin, demand -Known history of nonobstructive CAD.  Now here with severely reduced LV function.  Troponin elevation is related to demand.  Continue home aspirin and Plavix.  Lipids are at goal.  # Dementia -At baseline  [place] will sign off.   Medication Recommendations: As above Other recommendations (labs, testing, etc): None Follow up as an outpatient: None needed.  Patient transitioning to  home hospice tomorrow.  For questions or updates, please contact [place] Please consult [email or web address] for contact info under        Signed, [place] T. [name], MD, [place]  01/27/2023 10:42 AM

## 2023-01-27 NOTE — Progress Notes (Signed)
PROGRESS NOTE    Ian Harrison  [id number] DOB: 11-17-1926 DOA: 01/25/2023 PCP: Mackie Pai, PA-C  Chief Complaint  Patient presents with   Shortness of Breath    Brief Narrative:   Ian Harrison is Ian Harrison 89 y.o. Caucasian male with medical history significant for diastolic CHF, type diabetes mellitus, hypertension, coronary artery disease, dyslipidemia and dementia, who presented to the emergency room with acute onset of dyspnea mainly on exertion as well as worsening lower extremity edema.   Currently being treated for Ian Harrison HF exacerbation.  Assessment & Plan:   Principal Problem:   Acute on chronic combined systolic and diastolic CHF (congestive heart failure) Active Problems:   NSTEMI (non-ST elevated myocardial infarction)   Dementia with behavioral disturbance   Anemia, unspecified   BPH (benign prostatic hyperplasia)  Acute on chronic combined systolic and diastolic CHF (congestive heart failure) -- CXR with small pleural effusions with adjacent opacities, R>L peribronchial thickening - SBP remains soft, he actually didn't get any lasix yesterday due to this - RN to discuss with cards additional diuresis - 2D echo in 2020 showed EF of 60 to 65% with grade 1 diastolic dysfunction. - EF 25-30%, grade III diastolic dysfunction, mildly reduced RVSF -- cards c/s - planning for diuresis, digoxin - BP's are soft -- strict I/O, daily weights   NSTEMI (non-ST elevated myocardial infarction) - cards c/s, appreciate recs -- suspected demand ischemia in setting of HF exacerbation -- continue DAPT -- no longer taking ranexa per family.  Statin wasn't tolerated in past due to muscle aches.  Will d/c these. -- appreciate additional cards recs -- echo with EF 25-30%, grade III diastolic dysfunction, mildly reduced RVSF   Dementia with behavioral disturbance - delirium precautions -- seems about his baseline per discussion with daughter   Macrocytic Anemia - anemia of chronic  disease with iron def? Also low normal B12. -- supplement B12 and iron  -- trend H/H   BPH (benign prostatic hyperplasia) - We will continue his p.o. Flomax.   Goals of care -- palliative care c/s, appreciate recs -> plan for home with hospice when ready for discharge     DVT prophylaxis: lovenox Code Status: DNR/DNI Family Communication: daughter 4/4 Disposition:   Status is: Inpatient Remains inpatient appropriate because: need for diuresis, cards eval    Consultants:  cardiology  Procedures:  none  Antimicrobials:  Anti-infectives (From admission, onward)    None       Subjective: No complaints  Objective: Vitals:   01/26/23 1943 01/27/23 0025 01/27/23 0356 01/27/23 0726  BP: 101/67 101/73 102/78 93/62  Pulse: 87 89  85  Resp: 15 19 18 19   Temp: 98 F (36.7 C) 98.1 F (36.7 C) 97.7 F (36.5 C) (!) 97.5 F (36.4 C)  TempSrc: Oral Oral Oral Oral  SpO2: 98% 94%  98%  Weight:   72.3 kg   Height:        Intake/Output Summary (Last 24 hours) at 01/27/2023 0928 Last data filed at 01/27/2023 0840 Gross per 24 hour  Intake 840 ml  Output 400 ml  Net 440 ml   Filed Weights   01/25/23 2057 01/26/23 0608 01/27/23 0356  Weight: 73.6 kg 73.2 kg 72.3 kg    Examination:  General: No acute distress. Sitting up in chair. Cardiovascular: Heart sounds show Ian Harrison regular rate, and rhythm.  Lungs: Clear to auscultation bilaterally, diminished Abdomen: Soft, nontender, nondistended  Neurological: Alert and pleasantly confused. Moves all extremities 4 with equal strength. Cranial  nerves II through XII grossly intact. Extremities: trace edema, seems improved from yesterday  Data Reviewed: I have personally reviewed following labs and imaging studies  CBC: Recent Labs  Lab 01/25/23 1304 01/26/23 0608 01/27/23 0027  WBC 6.1 6.3 6.5  NEUTROABS  --   --  4.0  HGB 8.3* 8.5* 8.2*  HCT 25.3* 25.5* 23.2*  MCV 114.0* 112.8* 109.4*  PLT 210 202 187    Basic  Metabolic Panel: Recent Labs  Lab 01/25/23 1304 01/26/23 0608 01/27/23 0027  NA 132* 138 135  K 4.1 3.9 4.2  CL 99 98 102  CO2 20* 23 26  GLUCOSE 162* 138* 130*  BUN 35* 34* 42*  CREATININE 1.04 1.20 1.10  CALCIUM 9.0 8.7* 8.3*  MG  --   --  2.5*  PHOS  --   --  4.1    GFR: Estimated Creatinine Clearance: 42.9 mL/min (by C-G formula based on SCr of 1.1 mg/dL).  Liver Function Tests: Recent Labs  Lab 01/27/23 0027  AST 24  ALT 27  ALKPHOS 45  BILITOT 1.1  PROT 5.7*  ALBUMIN 3.2*    CBG: No results for input(s): "GLUCAP" in the last 168 hours.   No results found for this or any previous visit (from the past 240 hour(s)).       Radiology Studies: ECHOCARDIOGRAM COMPLETE  Result Date: 01/26/2023    ECHOCARDIOGRAM REPORT   Patient Name:   Ian Harrison Date of Exam: 01/26/2023 Medical Rec #:  [id number]        Height:       73.0 in Accession #:    [id number]       Weight:       161.4 lb Date of Birth:  05-04-1927        BSA:          1.964 m Patient Age:    70 years         BP:           95/60 mmHg Patient Gender: M                HR:           73 bpm. Exam Location:  Inpatient Procedure: 2D Echo, Color Doppler and Cardiac Doppler Indications:    NSTEMI  History:        Patient has prior history of Echocardiogram examinations. CHF,                 CAD and Previous Myocardial Infarction; Risk                 Factors:Hypertension and Diabetes.  Sonographer:    Phineas Douglas Referring Phys: [id number] [place]  1. Left ventricular ejection fraction, by estimation, is 25 to 30%. The left ventricle has severely decreased function. The left ventricle demonstrates global hypokinesis. Left ventricular diastolic parameters are consistent with Grade III diastolic dysfunction (restrictive). Elevated left atrial pressure.  2. Right ventricular systolic function is mildly reduced. The right ventricular size is mildly enlarged. There is moderately elevated pulmonary artery  systolic pressure. The estimated right ventricular systolic pressure is 0000000 mmHg.  3. Left atrial size was severely dilated.  4. Right atrial size was moderately dilated.  5. The mitral valve is normal in structure. Mild to moderate mitral valve regurgitation.  6. The aortic valve is tricuspid. There is moderate calcification of the aortic valve. There is moderate thickening of the aortic valve. Aortic valve regurgitation is  mild. Aortic valve sclerosis/calcification is present, without any evidence of aortic stenosis.  7. The inferior vena cava is dilated in size with <50% respiratory variability, suggesting right atrial pressure of 15 mmHg. Comparison(s): The left ventricular function is significantly worse. The left ventricular diastolic function is significantly worse. FINDINGS  Left Ventricle: Left ventricular ejection fraction, by estimation, is 25 to 30%. The left ventricle has severely decreased function. The left ventricle demonstrates global hypokinesis. The left ventricular internal cavity size was normal in size. There is no left ventricular hypertrophy. Left ventricular diastolic parameters are consistent with Grade III diastolic dysfunction (restrictive). Elevated left atrial pressure. Right Ventricle: The right ventricular size is mildly enlarged. No increase in right ventricular wall thickness. Right ventricular systolic function is mildly reduced. There is moderately elevated pulmonary artery systolic pressure. The tricuspid regurgitant velocity is 3.06 m/s, and with an assumed right atrial pressure of 15 mmHg, the estimated right ventricular systolic pressure is 0000000 mmHg. Left Atrium: Left atrial size was severely dilated. Right Atrium: Right atrial size was moderately dilated. Pericardium: There is no evidence of pericardial effusion. Mitral Valve: The mitral valve is normal in structure. Mild to moderate mitral valve regurgitation, with centrally-directed jet. Tricuspid Valve: The tricuspid  valve is normal in structure. Tricuspid valve regurgitation is mild. Aortic Valve: Immobile right coronary cusp. The aortic valve is tricuspid. There is moderate calcification of the aortic valve. There is moderate thickening of the aortic valve. Aortic valve regurgitation is mild. Aortic regurgitation PHT measures 364 msec. Aortic valve sclerosis/calcification is present, without any evidence of aortic stenosis. Aortic valve mean gradient measures 4.7 mmHg. Aortic valve peak gradient measures 9.3 mmHg. Aortic valve area, by VTI measures 2.22 cm. Pulmonic Valve: The pulmonic valve was grossly normal. Pulmonic valve regurgitation is mild. Aorta: The aortic root and ascending aorta are structurally normal, with no evidence of dilitation. Venous: The inferior vena cava is dilated in size with less than 50% respiratory variability, suggesting right atrial pressure of 15 mmHg. IAS/Shunts: No atrial level shunt detected by color flow Doppler.  LEFT VENTRICLE PLAX 2D LVIDd:         4.70 cm      Diastology LVIDs:         3.70 cm      LV e' medial:    3.73 cm/s LV PW:         0.90 cm      LV E/e' medial:  25.5 LV IVS:        1.00 cm      LV e' lateral:   6.32 cm/s LVOT diam:     2.20 cm      LV E/e' lateral: 15.0 LV SV:         56 LV SV Index:   29 LVOT Area:     3.80 cm  LV Volumes (MOD) LV vol d, MOD A2C: 135.0 ml LV vol d, MOD A4C: 134.0 ml LV vol s, MOD A2C: 95.2 ml LV vol s, MOD A4C: 98.5 ml LV SV MOD A2C:     39.8 ml LV SV MOD A4C:     134.0 ml LV SV MOD BP:      37.9 ml RIGHT VENTRICLE             IVC RV Basal diam:  4.80 cm     IVC diam: 2.60 cm RV S prime:     10.10 cm/s TAPSE (M-mode): 1.9 cm LEFT ATRIUM  Index        RIGHT ATRIUM           Index LA diam:        4.10 cm 2.09 cm/m   RA Area:     21.00 cm LA Vol (A2C):   83.8 ml 42.67 ml/m  RA Volume:   63.70 ml  32.43 ml/m LA Vol (A4C):   71.1 ml 36.20 ml/m LA Biplane Vol: 85.8 ml 43.68 ml/m  AORTIC VALVE AV Area (Vmax):    1.93 cm AV Area  (Vmean):   2.20 cm AV Area (VTI):     2.22 cm AV Vmax:           152.33 cm/s AV Vmean:          100.055 cm/s AV VTI:            0.254 m AV Peak Grad:      9.3 mmHg AV Mean Grad:      4.7 mmHg LVOT Vmax:         77.50 cm/s LVOT Vmean:        58.000 cm/s LVOT VTI:          0.148 m LVOT/AV VTI ratio: 0.58 AI PHT:            364 msec  AORTA Ao Root diam: 3.50 cm Ao Asc diam:  3.60 cm MITRAL VALVE                  TRICUSPID VALVE MV Area (PHT): 4.71 cm       TR Peak grad:   37.5 mmHg MV Decel Time: 161 msec       TR Vmax:        306.00 cm/s MR Peak grad:    72.2 mmHg MR Mean grad:    44.0 mmHg    SHUNTS MR Vmax:         425.00 cm/s  Systemic VTI:  0.15 m MR Vmean:        309.0 cm/s   Systemic Diam: 2.20 cm MR PISA:         1.01 cm MR PISA Eff ROA: 8 mm MR PISA Radius:  0.40 cm MV E velocity: 95.10 cm/s MV Ganon Demasi velocity: 47.60 cm/s MV E/Dheeraj Hail ratio:  2.00 Mihai Croitoru MD Electronically signed by Sanda Klein MD Signature Date/Time: 01/26/2023/11:33:13 AM    Final    DG Chest Portable 1 View  Result Date: 01/25/2023 CLINICAL DATA:  Shortness of breath.  Hypoxia EXAM: PORTABLE CHEST 1 VIEW COMPARISON:  Chest x-ray 08/08/2022 FINDINGS: Small bilateral pleural effusions with some adjacent opacities. Right-greater-than-left. Atelectasis versus infiltrate. Recommend follow-up. No pneumothorax. Enlarged cardiopericardial silhouette. Peribronchial thickening. Overlapping cardiac leads. Film is under penetrated degenerative changes of the shoulders. IMPRESSION: Small pleural effusions at the bases with some adjacent opacities, right-greater-than-left with peribronchial thickening. Recommend follow-up. Electronically Signed   By: Jill Side M.D.   On: 01/25/2023 13:26        Scheduled Meds:  aspirin EC  81 mg Oral Q breakfast   clopidogrel  75 mg Oral Daily   vitamin B-12  1,000 mcg Oral Daily   digoxin  0.125 mg Oral Daily   enoxaparin (LOVENOX) injection  40 mg Subcutaneous Q24H   ferrous sulfate  325 mg Oral  QODAY   furosemide  80 mg Intravenous BID   spironolactone  12.5 mg Oral Daily   tamsulosin  0.4 mg Oral QPC breakfast   traZODone  25 mg Oral QHS  Continuous Infusions:   LOS: 2 days    Time spent: over 30 min    Fayrene Helper, MD [facility]   To contact the attending provider between 7A-7P or the covering provider during after hours 7P-7A, please log into the web site [email or web address] and access using universal [place] password for that web site. If you do not have the password, please call the hospital operator.  01/27/2023, 9:28 AM

## 2023-01-27 NOTE — Progress Notes (Signed)
   Referral received for hospice care at home. The pt's daughter [name] was contacted and she is in agreement with hospice services at home. They have equipment in home of walker and lift chair. Now on oxygen so we have ordered a hospital bed, oxygen and a wheelchair to be delivered to the home. The pt has been approved by our MD for hospice services at home.   Webb Silversmith RN [phone]

## 2023-01-27 NOTE — Plan of Care (Signed)

## 2023-01-27 NOTE — Telephone Encounter (Signed)
Pt still in [facility] per [place] notes 01/27/23 from hospital --   Referral received for hospice care at home. The pt's daughter [name] was contacted and she is in agreement with hospice services at home. They have equipment in home of walker and lift chair. Now on oxygen so we have ordered a hospital bed, oxygen and a wheelchair to be delivered to the home. The pt has been approved by our MD for hospice services at home.

## 2023-01-27 NOTE — Evaluation (Signed)
Physical Therapy Evaluation Patient Details Name: Ian Harrison MRN: [id number] DOB: 1927/07/07 Today's Date: 01/27/2023  History of Present Illness  Pt is a 89 yo male admitted on 01/25/23 for acute onset of dyspnea mainly on exertion as well as worsening lower extremity edema. Past medical history significant for diastolic CHF, type diabetes mellitus, hypertension, coronary artery disease, dyslipidemia and dementia.  Clinical Impression  Pt presents with admitting diagnosis above. Pt was able to ambulate short distance in room with RW however was very limited by decreased activity tolerance. Pt daughter was present and able to answer all questions. At baseline, pt has dementia and lives at home with spouse who helps with ADLs. Pt daughter reports that pt was able to ambulate around house with no AD at baseline occasionally using RW when fatigued. Pt daughter also reports that pt will be discharging home with hospice services. Pt would benefit from HHPT with hospice service to improve functional mobility. PT will continue to follow.      Recommendations for follow up therapy are one component of a multi-disciplinary discharge planning process, led by the attending physician.  Recommendations may be updated based on patient status, additional functional criteria and insurance authorization.  Follow Up Recommendations       Assistance Recommended at Discharge Intermittent Supervision/Assistance  Patient can return home with the following  A little help with walking and/or transfers;A lot of help with bathing/dressing/bathroom;Assistance with cooking/housework;Direct supervision/assist for medications management;Assist for transportation;Help with stairs or ramp for entrance    Equipment Recommendations Wheelchair cushion (measurements PT);BSC/3in1;Wheelchair (measurements PT);Hospital bed  Recommendations for Other Services       Functional Status Assessment Patient has had a recent decline  in their functional status and demonstrates the ability to make significant improvements in function in a reasonable and predictable amount of time.     Precautions / Restrictions Precautions Precautions: Fall Restrictions Weight Bearing Restrictions: No      Mobility  Bed Mobility               General bed mobility comments: Pt up in chair    Transfers Overall transfer level: Needs assistance Equipment used: Rolling walker (2 wheels) Transfers: Sit to/from Stand Sit to Stand: Min assist                Ambulation/Gait Ambulation/Gait assistance: Min guard Gait Distance (Feet): 10 Feet (5+5) Assistive device: Rolling walker (2 wheels) Gait Pattern/deviations: Trunk flexed, Decreased stride length, Step-through pattern Gait velocity: decreased     General Gait Details: 1 standing rest break  Stairs            Wheelchair Mobility    Modified Rankin (Stroke Patients Only)       Balance Overall balance assessment: Mild deficits observed, not formally tested                                           Pertinent Vitals/Pain Pain Assessment Pain Assessment: No/denies pain    Home Living Family/patient expects to be discharged to:: Private residence Living Arrangements: Spouse/significant other Available Help at Discharge: Family;Available 24 hours/day Type of Home: House Home Access: Stairs to enter Entrance Stairs-Rails: None Entrance Stairs-Number of Steps: 1   Home Layout: One level Home Equipment: Conservation officer, nature (2 wheels);Grab bars - toilet      Prior Function Prior Level of Function : Needs assist;History of Falls (last six  months) (1 fall last October with no injuries)  Cognitive Assist : ADLs (cognitive);Mobility (cognitive) (Dementia at baseline)           Mobility Comments: Pt daughter states that he was fairly independent able to walk up and down hallway in home with no AD however would occasionally use RW when  fatigued. ADLs Comments: Pt wife helps with sponge bathing at baseline. Pt wife helps out with most ADLs per daughter.     Hand Dominance   Dominant Hand: Left    Extremity/Trunk Assessment   Upper Extremity Assessment Upper Extremity Assessment: Overall WFL for tasks assessed    Lower Extremity Assessment Lower Extremity Assessment: Overall WFL for tasks assessed    Cervical / Trunk Assessment Cervical / Trunk Assessment: Kyphotic  Communication   Communication: HOH  Cognition Arousal/Alertness: Lethargic Behavior During Therapy: Flat affect Overall Cognitive Status: History of cognitive impairments - at baseline                                 General Comments: Pt with dementia at baseline. Pt perseverated on asking where we were. Pt thought we were in [place].        General Comments General comments (skin integrity, edema, etc.): Pt received on 2L satting at 87% however pleth was questionable. Pt left on 2L at 95% with good pleth.    Exercises     Assessment/Plan    PT Assessment Patient needs continued PT services  PT Problem List Decreased strength;Decreased activity tolerance;Decreased balance;Decreased mobility;Decreased coordination;Decreased cognition;Decreased knowledge of use of DME;Decreased safety awareness;Cardiopulmonary status limiting activity       PT Treatment Interventions DME instruction;Gait training;Stair training;Functional mobility training;Therapeutic activities;Therapeutic exercise;Neuromuscular re-education;Patient/family education    PT Goals (Current goals can be found in the Care Plan section)  Acute Rehab PT Goals Patient Stated Goal: to go home PT Goal Formulation: With patient/family Time For Goal Achievement: 02/10/23 Potential to Achieve Goals: Fair    Frequency Min 1X/week     Co-evaluation               AM-PAC PT "6 Clicks" Mobility  Outcome Measure Help needed turning from your back to your  side while in a flat bed without using bedrails?: A Little Help needed moving from lying on your back to sitting on the side of a flat bed without using bedrails?: A Little Help needed moving to and from a bed to a chair (including a wheelchair)?: A Little Help needed standing up from a chair using your arms (e.g., wheelchair or bedside chair)?: A Little Help needed to walk in hospital room?: A Little Help needed climbing 3-5 steps with a railing? : A Lot 6 Click Score: 17    End of Session Equipment Utilized During Treatment: Gait belt;Oxygen Activity Tolerance: Patient limited by fatigue Patient left: in chair;with call bell/phone within reach;with chair alarm set;with family/visitor present Nurse Communication: Mobility status PT Visit Diagnosis: Other abnormalities of gait and mobility (R26.89)    Time: [id number] PT Time Calculation (min) (ACUTE ONLY): 29 min   Charges:   PT Evaluation $PT Eval Moderate Complexity: 1 Mod PT Treatments $Gait Training: 8-22 mins        Shelby Mattocks, PT, DPT Acute Rehab Services [id number]   Viann Shove 01/27/2023, 10:40 AM

## 2023-01-28 ENCOUNTER — Other Ambulatory Visit (HOSPITAL_BASED_OUTPATIENT_CLINIC_OR_DEPARTMENT_OTHER): Payer: Self-pay

## 2023-01-28 ENCOUNTER — Other Ambulatory Visit (HOSPITAL_COMMUNITY): Payer: Self-pay

## 2023-01-28 DIAGNOSIS — I5043 Acute on chronic combined systolic (congestive) and diastolic (congestive) heart failure: Secondary | ICD-10-CM | POA: Diagnosis not present

## 2023-01-28 LAB — CBC
HCT: 23.5 % — ABNORMAL LOW (ref 39.0–52.0)
Hemoglobin: 7.9 g/dL — ABNORMAL LOW (ref 13.0–17.0)
MCH: 36.9 pg — ABNORMAL HIGH (ref 26.0–34.0)
MCHC: 33.6 g/dL (ref 30.0–36.0)
MCV: 109.8 fL — ABNORMAL HIGH (ref 80.0–100.0)
Platelets: 192 10*3/uL (ref 150–400)
RBC: 2.14 MIL/uL — ABNORMAL LOW (ref 4.22–5.81)
RDW: 16.2 % — ABNORMAL HIGH (ref 11.5–15.5)
WBC: 5.2 10*3/uL (ref 4.0–10.5)
nRBC: 2.3 % — ABNORMAL HIGH (ref 0.0–0.2)

## 2023-01-28 LAB — BASIC METABOLIC PANEL
Anion gap: 10 (ref 5–15)
BUN: 34 mg/dL — ABNORMAL HIGH (ref 8–23)
CO2: 26 mmol/L (ref 22–32)
Calcium: 8.2 mg/dL — ABNORMAL LOW (ref 8.9–10.3)
Chloride: 99 mmol/L (ref 98–111)
Creatinine, Ser: 1.06 mg/dL (ref 0.61–1.24)
GFR, Estimated: >60 mL/min (ref >60)
Glucose, Bld: 144 mg/dL — ABNORMAL HIGH (ref 70–99)
Potassium: 3.7 mmol/L (ref 3.5–5.1)
Sodium: 135 mmol/L (ref 135–145)

## 2023-01-28 LAB — PHOSPHORUS: Phosphorus: 4.1 mg/dL (ref 2.5–4.6)

## 2023-01-28 LAB — LIPOPROTEIN A (LPA): Lipoprotein (a): 28.3 nmol/L (ref <75.0)

## 2023-01-28 LAB — MAGNESIUM: Magnesium: 2.3 mg/dL (ref 1.7–2.4)

## 2023-01-28 MED ORDER — HALOPERIDOL 0.5 MG PO TABS
0.5000 mg | ORAL_TABLET | ORAL | 0 refills | Status: DC | PRN
  Filled 2023-01-28 (×2): qty 10, 2d supply, fill #0

## 2023-01-28 MED ORDER — TAMSULOSIN HCL 0.4 MG PO CAPS: 0.4000 mg | ORAL_CAPSULE | Freq: Every day | ORAL | Status: DC

## 2023-01-28 MED ORDER — OXYCODONE HCL 5 MG PO TABS
2.5000 mg | ORAL_TABLET | ORAL | 0 refills | Status: DC | PRN
  Filled 2023-01-28: qty 10, 4d supply, fill #0

## 2023-01-28 MED ORDER — LORAZEPAM 0.5 MG PO TABS
0.5000 mg | ORAL_TABLET | Freq: Three times a day (TID) | ORAL | 0 refills | Status: DC | PRN
  Filled 2023-01-28: qty 6, 2d supply, fill #0

## 2023-01-28 MED ORDER — FERROUS SULFATE 325 (65 FE) MG PO TABS
325.0000 mg | ORAL_TABLET | ORAL | 0 refills | Status: AC
  Filled 2023-01-28: qty 15, 30d supply, fill #0

## 2023-01-28 MED ORDER — DIGOXIN 125 MCG PO TABS
0.1250 mg | ORAL_TABLET | Freq: Every day | ORAL | 0 refills | Status: AC
  Filled 2023-01-28: qty 30, 30d supply, fill #0

## 2023-01-28 MED ORDER — CYANOCOBALAMIN 1000 MCG PO TABS
1000.0000 ug | ORAL_TABLET | Freq: Every day | ORAL | 0 refills | Status: AC
  Filled 2023-01-28: qty 30, 30d supply, fill #0

## 2023-01-28 MED ORDER — SENNOSIDES-DOCUSATE SODIUM 8.6-50 MG PO TABS
1.0000 | ORAL_TABLET | Freq: Every day | ORAL | 0 refills | Status: DC
  Filled 2023-01-28: qty 60, 30d supply, fill #0

## 2023-01-28 MED ORDER — FUROSEMIDE 40 MG PO TABS
40.0000 mg | ORAL_TABLET | Freq: Every day | ORAL | 0 refills | Status: AC
  Filled 2023-01-28: qty 30, 30d supply, fill #0

## 2023-01-28 MED ORDER — SPIRONOLACTONE 25 MG PO TABS
12.5000 mg | ORAL_TABLET | Freq: Every day | ORAL | 0 refills | Status: AC
  Filled 2023-01-28: qty 15, 30d supply, fill #0

## 2023-01-28 NOTE — Care Management Important Message (Signed)
Important Message  Patient Details  Name: Ian Harrison MRN: [id number] Date of Birth: [birth year]   Medicare Important Message Given:  Yes  Patient left prior to IM delivery will mail the copy of IM to patient home address.    Jailyn Langhorst 01/28/2023, 4:06 PM

## 2023-01-28 NOTE — Progress Notes (Signed)
Discharge instructions reviewed with pt's daughter.  Copy of instructions given to pt's daughter. Informed [facility] has filled new scripts and will be delivered to room or picked up at the pharmacy on the way out.  Pt's daughter and unit NT assisting pt with getting dressed and taken to bathroom before leaving.   Pt to be d/c'd via wheelchair with belongings, with daughter.           To be escorted by staff when ready.

## 2023-01-28 NOTE — TOC Transition Note (Signed)
Transition of Care [facility]) - CM/SW Discharge Note   Patient Details  Name: Ian Harrison MRN: [id number] Date of Birth: [birth year]  [facility] CM/SW Contact:  Leone Haven, RN Phone Number: 01/28/2023, 10:00 AM   Clinical Narrative:    Patient is for dc today, per Cheri with [facility], DME is at the home, family member will transport him home.          Patient Goals and CMS Choice      Discharge Placement                         Discharge Plan and Services Additional resources added to the After Visit Summary for                                       Social Determinants of Health (SDOH) Interventions SDOH Screenings   Food Insecurity: No Food Insecurity (08/09/2022)  Housing: Low Risk  (07/14/2021)  Transportation Needs: No Transportation Needs (08/09/2022)  Utilities: Not At Risk (08/09/2022)  Alcohol Screen: Low Risk  (08/09/2022)  Depression (PHQ2-9): Low Risk  (07/14/2021)  Financial Resource Strain: Low Risk  (08/09/2022)  Physical Activity: Inactive (08/09/2022)  Social Connections: Moderately Isolated (07/14/2021)  Stress: No Stress Concern Present (07/14/2021)  Tobacco Use: Medium Risk (01/25/2023)     Readmission Risk Interventions     No data to display

## 2023-01-28 NOTE — Care Management Important Message (Signed)
Important Message  Patient Details  Name: Kainen Yax MRN: [id number] Date of Birth: [birth year]   Medicare Important Message Given:  Yes     Dorena Bodo 01/28/2023, 4:05 PM

## 2023-01-28 NOTE — Discharge Summary (Signed)
Physician Discharge Summary  Ian Harrison ZOX:[id number] DOB: 19-Jan-1927 DOA: 01/25/2023  PCP: Ian Richters, PA-C  Admit date: 01/25/2023 Discharge date: 01/28/2023  Time spent: 40 minutes  Recommendations for Outpatient Follow-up:  Follow outpatient CBC/CMP  Follow with PCP/hospice outpatient   Discharge Diagnoses:  Principal Problem:   Acute on chronic combined systolic and diastolic CHF (congestive heart failure) Active Problems:   NSTEMI (non-ST elevated myocardial infarction)   Dementia with behavioral disturbance   Anemia, unspecified   BPH (benign prostatic hyperplasia)   Discharge Condition: stable  Diet recommendation: heart healthy  Filed Weights   01/26/23 0608 01/27/23 0356 01/28/23 0003  Weight: 73.2 kg 72.3 kg 72.4 kg    History of present illness:   Ian Harrison is Ian Harrison 89 y.o. Caucasian male with medical history significant for diastolic CHF, type diabetes mellitus, hypertension, coronary artery disease, dyslipidemia and dementia, who presented to the emergency room with acute onset of dyspnea mainly on exertion as well as worsening lower extremity edema.   Currently being treated for Ian Harrison HF exacerbation.   Discharging home with lasix, spironolactone, digoxin.  Home with hospice given his dementia and systolic heart failure.    Hospital Course:  Assessment and Plan:  Acute on chronic combined systolic and diastolic CHF (congestive heart failure) -- CXR with small pleural effusions with adjacent opacities, R>L peribronchial thickening - SBP remains soft - 2D echo in 2020 showed EF of 60 to 65% with grade 1 diastolic dysfunction. - EF 25-30%, grade III diastolic dysfunction, mildly reduced RVSF -- cards c/s - planning for diuresis, digoxin - will discharge with spironolactone, lasix, digoxin -- strict I/O, daily weights   NSTEMI (non-ST elevated myocardial infarction) - cards c/s, appreciate recs -- suspected demand ischemia in setting of HF  exacerbation -- continue DAPT -- no longer taking ranexa per family.  Statin wasn't tolerated in past due to muscle aches.  Will d/c these. -- appreciate additional cards recs -- echo with EF 25-30%, grade III diastolic dysfunction, mildly reduced RVSF   Dementia with behavioral disturbance - delirium precautions -- seems about his baseline per discussion with daughter   Macrocytic Anemia - anemia of chronic disease with iron def? Also low normal B12. -- supplement B12 and iron    BPH (benign prostatic hyperplasia) - We will continue his p.o. Flomax.   Goals of care -- palliative care c/s, appreciate recs -> plan for home with hospice       Procedures: Echo IMPRESSIONS     1. Left ventricular ejection fraction, by estimation, is 25 to 30%. The  left ventricle has severely decreased function. The left ventricle  demonstrates global hypokinesis. Left ventricular diastolic parameters are  consistent with Grade III diastolic  dysfunction (restrictive). Elevated left atrial pressure.   2. Right ventricular systolic function is mildly reduced. The right  ventricular size is mildly enlarged. There is moderately elevated  pulmonary artery systolic pressure. The estimated right ventricular  systolic pressure is 52.5 mmHg.   3. Left atrial size was severely dilated.   4. Right atrial size was moderately dilated.   5. The mitral valve is normal in structure. Mild to moderate mitral valve  regurgitation.   6. The aortic valve is tricuspid. There is moderate calcification of the  aortic valve. There is moderate thickening of the aortic valve. Aortic  valve regurgitation is mild. Aortic valve sclerosis/calcification is  present, without any evidence of aortic  stenosis.   7. The inferior vena cava is dilated in size with <  50% respiratory  variability, suggesting right atrial pressure of 15 mmHg.   Comparison(s): The left ventricular function is significantly worse. The  left  ventricular diastolic function is significantly worse.     Consultations: Cardiology palliative  Discharge Exam: Vitals:   01/28/23 0003 01/28/23 0912  BP: 99/61 97/66  Pulse: 82   Resp: 18   Temp: 98 F (36.7 C)   SpO2: 94%    Pleasantly confused, no complaints Daughter at bedside - discussed discharge plan with her  General: No acute distress. Cardiovascular: RRR Lungs: Clear to auscultation bilaterally, unlabored Neurological: Alert. Moves all extremities 4 with equal strength. Cranial nerves II through XII grossly intact. Extremities: trace edema, trace dependendent edema to hips  Discharge Instructions   Discharge Instructions     (HEART FAILURE PATIENTS) Call MD:  Anytime you have any of the following symptoms: 1) 3 pound weight gain in 24 hours or 5 pounds in 1 week 2) shortness of breath, with or without Ian Harrison dry hacking cough 3) swelling in the hands, feet or stomach 4) if you have to sleep on extra pillows at night in order to breathe.   Complete by: As directed    Call MD for:  difficulty breathing, headache or visual disturbances   Complete by: As directed    Call MD for:  extreme fatigue   Complete by: As directed    Call MD for:  hives   Complete by: As directed    Call MD for:  persistant dizziness or light-headedness   Complete by: As directed    Call MD for:  persistant nausea and vomiting   Complete by: As directed    Call MD for:  redness, tenderness, or signs of infection (pain, swelling, redness, odor or green/yellow discharge around incision site)   Complete by: As directed    Call MD for:  severe uncontrolled pain   Complete by: As directed    Call MD for:  temperature >100.4   Complete by: As directed    Diet - low sodium heart healthy   Complete by: As directed    Discharge instructions   Complete by: As directed    You were seen for heart failure.  Your ultrasound of your hear showed an ejection fraction of 25%-30%.  You were seen by  cardiology and we've started you on lasix, digoxin, and spironolactone.  We're managing the heart failure with Ian Harrison palliative approach and home hospice has been arranged.    Follow with your outpatient provider in Ian Harrison week or so to follow up labs with these new diuretics.    I think trazodone is Matan Steen good sleep aid and can help with trouble sleeping at home.  Use the trazodone as prescribed, it's Lateef Juncaj well tolerated medicine.    I've started vitamin b12 and iron for your anemia.   Continue to follow with hospice and your PCP to discuss continued goals of care.   Return for new, recurrent, or worsening symptoms.  Please ask your PCP to request records from this hospitalization so they know what was done and what the next steps will be.   Increase activity slowly   Complete by: As directed       Allergies as of 01/28/2023       Reactions   Atorvastatin Other (See Comments)   myalgia   Morphine And Related Other (See Comments)   Mood changes (anxious)        Medication List     STOP taking  these medications    ranolazine 500 MG 12 hr tablet Commonly known as: Ranexa       TAKE these medications    aspirin EC 81 MG tablet Take 81 mg by mouth daily with breakfast.   clopidogrel 75 MG tablet Commonly known as: PLAVIX Take 1 tablet (75 mg total) by mouth daily.   cyanocobalamin 1000 MCG tablet Take 1 tablet (1,000 mcg total) by mouth daily. Start taking on: January 29, 2023   digoxin 0.125 MG tablet Commonly known as: LANOXIN Take 1 tablet (0.125 mg total) by mouth daily. Start taking on: January 29, 2023   ferrous sulfate 325 (65 FE) MG tablet Take 1 tablet (325 mg total) by mouth every other day. Start taking on: January 30, 2023   furosemide 40 MG tablet Commonly known as: Lasix Take 1 tablet (40 mg total) by mouth daily.   nitroGLYCERIN 0.4 MG SL tablet Commonly known as: NITROSTAT Place 1 tablet (0.4 mg total) under the tongue every 5 (five) minutes as needed for chest  pain.   spironolactone 25 MG tablet Commonly known as: ALDACTONE Take 0.5 tablets (12.5 mg total) by mouth daily. Start taking on: January 29, 2023   tamsulosin 0.4 MG Caps capsule Commonly known as: FLOMAX Take 1 capsule (0.4 mg total) by mouth daily.   traZODone 50 MG tablet Commonly known as: DESYREL Take 0.5-1 tablets (25-50 mg total) by mouth at bedtime as needed for sleep.       Allergies  Allergen Reactions   Atorvastatin Other (See Comments)    myalgia   Morphine And Related Other (See Comments)    Mood changes (anxious)    Follow-up Information     [facility] Follow up.   Why: home hospice Contact information: [address] [place] [address] [phone]                  The results of significant diagnostics from this hospitalization (including imaging, microbiology, ancillary and laboratory) are listed below for reference.    Significant Diagnostic Studies: ECHOCARDIOGRAM COMPLETE  Result Date: 01/26/2023    ECHOCARDIOGRAM REPORT   Patient Name:   WAYMON LASER Date of Exam: 01/26/2023 Medical Rec #:  [id number]        Height:       73.0 in Accession #:    [id number]       Weight:       161.4 lb Date of Birth:  09-28-1927        BSA:          1.964 m Patient Age:    89 years         BP:           95/60 mmHg Patient Gender: M                HR:           73 bpm. Exam Location:  Inpatient Procedure: 2D Echo, Color Doppler and Cardiac Doppler Indications:    NSTEMI  History:        Patient has prior history of Echocardiogram examinations. CHF,                 CAD and Previous Myocardial Infarction; Risk                 Factors:Hypertension and Diabetes.  Sonographer:    Mike Gip Referring Phys: [id number] JAN Kessler Kopinski MANSY IMPRESSIONS  1. Left ventricular ejection fraction, by estimation,  is 25 to 30%. The left ventricle has severely decreased function. The left ventricle demonstrates global hypokinesis. Left ventricular diastolic parameters are  consistent with Grade III diastolic dysfunction (restrictive). Elevated left atrial pressure.  2. Right ventricular systolic function is mildly reduced. The right ventricular size is mildly enlarged. There is moderately elevated pulmonary artery systolic pressure. The estimated right ventricular systolic pressure is 52.5 mmHg.  3. Left atrial size was severely dilated.  4. Right atrial size was moderately dilated.  5. The mitral valve is normal in structure. Mild to moderate mitral valve regurgitation.  6. The aortic valve is tricuspid. There is moderate calcification of the aortic valve. There is moderate thickening of the aortic valve. Aortic valve regurgitation is mild. Aortic valve sclerosis/calcification is present, without any evidence of aortic stenosis.  7. The inferior vena cava is dilated in size with <50% respiratory variability, suggesting right atrial pressure of 15 mmHg. Comparison(s): The left ventricular function is significantly worse. The left ventricular diastolic function is significantly worse. FINDINGS  Left Ventricle: Left ventricular ejection fraction, by estimation, is 25 to 30%. The left ventricle has severely decreased function. The left ventricle demonstrates global hypokinesis. The left ventricular internal cavity size was normal in size. There is no left ventricular hypertrophy. Left ventricular diastolic parameters are consistent with Grade III diastolic dysfunction (restrictive). Elevated left atrial pressure. Right Ventricle: The right ventricular size is mildly enlarged. No increase in right ventricular wall thickness. Right ventricular systolic function is mildly reduced. There is moderately elevated pulmonary artery systolic pressure. The tricuspid regurgitant velocity is 3.06 m/s, and with an assumed right atrial pressure of 15 mmHg, the estimated right ventricular systolic pressure is 52.5 mmHg. Left Atrium: Left atrial size was severely dilated. Right Atrium: Right atrial size  was moderately dilated. Pericardium: There is no evidence of pericardial effusion. Mitral Valve: The mitral valve is normal in structure. Mild to moderate mitral valve regurgitation, with centrally-directed jet. Tricuspid Valve: The tricuspid valve is normal in structure. Tricuspid valve regurgitation is mild. Aortic Valve: Immobile right coronary cusp. The aortic valve is tricuspid. There is moderate calcification of the aortic valve. There is moderate thickening of the aortic valve. Aortic valve regurgitation is mild. Aortic regurgitation PHT measures 364 msec. Aortic valve sclerosis/calcification is present, without any evidence of aortic stenosis. Aortic valve mean gradient measures 4.7 mmHg. Aortic valve peak gradient measures 9.3 mmHg. Aortic valve area, by VTI measures 2.22 cm. Pulmonic Valve: The pulmonic valve was grossly normal. Pulmonic valve regurgitation is mild. Aorta: The aortic root and ascending aorta are structurally normal, with no evidence of dilitation. Venous: The inferior vena cava is dilated in size with less than 50% respiratory variability, suggesting right atrial pressure of 15 mmHg. IAS/Shunts: No atrial level shunt detected by color flow Doppler.  LEFT VENTRICLE PLAX 2D LVIDd:         4.70 cm      Diastology LVIDs:         3.70 cm      LV e' medial:    3.73 cm/s LV PW:         0.90 cm      LV E/e' medial:  25.5 LV IVS:        1.00 cm      LV e' lateral:   6.32 cm/s LVOT diam:     2.20 cm      LV E/e' lateral: 15.0 LV SV:         56 LV SV Index:  29 LVOT Area:     3.80 cm  LV Volumes (MOD) LV vol d, MOD A2C: 135.0 ml LV vol d, MOD A4C: 134.0 ml LV vol s, MOD A2C: 95.2 ml LV vol s, MOD A4C: 98.5 ml LV SV MOD A2C:     39.8 ml LV SV MOD A4C:     134.0 ml LV SV MOD BP:      37.9 ml RIGHT VENTRICLE             IVC RV Basal diam:  4.80 cm     IVC diam: 2.60 cm RV S prime:     10.10 cm/s TAPSE (M-mode): 1.9 cm LEFT ATRIUM             Index        RIGHT ATRIUM           Index LA diam:         4.10 cm 2.09 cm/m   RA Area:     21.00 cm LA Vol (A2C):   83.8 ml 42.67 ml/m  RA Volume:   63.70 ml  32.43 ml/m LA Vol (A4C):   71.1 ml 36.20 ml/m LA Biplane Vol: 85.8 ml 43.68 ml/m  AORTIC VALVE AV Area (Vmax):    1.93 cm AV Area (Vmean):   2.20 cm AV Area (VTI):     2.22 cm AV Vmax:           152.33 cm/s AV Vmean:          100.055 cm/s AV VTI:            0.254 m AV Peak Grad:      9.3 mmHg AV Mean Grad:      4.7 mmHg LVOT Vmax:         77.50 cm/s LVOT Vmean:        58.000 cm/s LVOT VTI:          0.148 m LVOT/AV VTI ratio: 0.58 AI PHT:            364 msec  AORTA Ao Root diam: 3.50 cm Ao Asc diam:  3.60 cm MITRAL VALVE                  TRICUSPID VALVE MV Area (PHT): 4.71 cm       TR Peak grad:   37.5 mmHg MV Decel Time: 161 msec       TR Vmax:        306.00 cm/s MR Peak grad:    72.2 mmHg MR Mean grad:    44.0 mmHg    SHUNTS MR Vmax:         425.00 cm/s  Systemic VTI:  0.15 m MR Vmean:        309.0 cm/s   Systemic Diam: 2.20 cm MR PISA:         1.01 cm MR PISA Eff ROA: 8 mm MR PISA Radius:  0.40 cm MV E velocity: 95.10 cm/s MV Anwar Crill velocity: 47.60 cm/s MV E/Shaunice Levitan ratio:  2.00 Mihai Croitoru MD Electronically signed by Thurmon FairMihai Croitoru MD Signature Date/Time: 01/26/2023/11:33:13 AM    Final    DG Chest Portable 1 View  Result Date: 01/25/2023 CLINICAL DATA:  Shortness of breath.  Hypoxia EXAM: PORTABLE CHEST 1 VIEW COMPARISON:  Chest x-ray 08/08/2022 FINDINGS: Small bilateral pleural effusions with some adjacent opacities. Right-greater-than-left. Atelectasis versus infiltrate. Recommend follow-up. No pneumothorax. Enlarged cardiopericardial silhouette. Peribronchial thickening. Overlapping cardiac leads. Film is under penetrated degenerative changes of the  shoulders. IMPRESSION: Small pleural effusions at the bases with some adjacent opacities, right-greater-than-left with peribronchial thickening. Recommend follow-up. Electronically Signed   By: Karen Kays M.D.   On: 01/25/2023 13:26    Microbiology: No  results found for this or any previous visit (from the past 240 hour(s)).   Labs: Basic Metabolic Panel: Recent Labs  Lab 01/25/23 1304 01/26/23 0608 01/27/23 0027 01/28/23 0025  NA 132* 138 135 135  K 4.1 3.9 4.2 3.7  CL 99 98 102 99  CO2 20* 23 26 26   GLUCOSE 162* 138* 130* 144*  BUN 35* 34* 42* 34*  CREATININE 1.04 1.20 1.10 1.06  CALCIUM 9.0 8.7* 8.3* 8.2*  MG  --   --  2.5* 2.3  PHOS  --   --  4.1 4.1   Liver Function Tests: Recent Labs  Lab 01/27/23 0027  AST 24  ALT 27  ALKPHOS 45  BILITOT 1.1  PROT 5.7*  ALBUMIN 3.2*   No results for input(s): "LIPASE", "AMYLASE" in the last 168 hours. No results for input(s): "AMMONIA" in the last 168 hours. CBC: Recent Labs  Lab 01/25/23 1304 01/26/23 0608 01/27/23 0027 01/28/23 0025  WBC 6.1 6.3 6.5 5.2  NEUTROABS  --   --  4.0  --   HGB 8.3* 8.5* 8.2* 7.9*  HCT 25.3* 25.5* 23.2* 23.5*  MCV 114.0* 112.8* 109.4* 109.8*  PLT 210 202 187 192   Cardiac Enzymes: No results for input(s): "CKTOTAL", "CKMB", "CKMBINDEX", "TROPONINI" in the last 168 hours. BNP: BNP (last 3 results) Recent Labs    01/25/23 1341  BNP 2,165.9*    ProBNP (last 3 results) No results for input(s): "PROBNP" in the last 8760 hours.  CBG: No results for input(s): "GLUCAP" in the last 168 hours.     Signed:  Lacretia Nicks MD.  [facility] 01/28/2023, 9:51 AM

## 2023-02-04 ENCOUNTER — Other Ambulatory Visit (HOSPITAL_COMMUNITY): Payer: Self-pay

## 2023-02-08 ENCOUNTER — Telehealth: Payer: Self-pay

## 2023-02-08 NOTE — Telephone Encounter (Signed)
Pallative Care change  status form from [facility] care received and placed to scan for medical records

## 2023-02-10 ENCOUNTER — Other Ambulatory Visit (HOSPITAL_BASED_OUTPATIENT_CLINIC_OR_DEPARTMENT_OTHER): Payer: Self-pay

## 2023-02-15 ENCOUNTER — Other Ambulatory Visit (HOSPITAL_BASED_OUTPATIENT_CLINIC_OR_DEPARTMENT_OTHER): Payer: Self-pay

## 2023-02-15 MED ORDER — LORAZEPAM 0.5 MG PO TABS
0.5000 mg | ORAL_TABLET | Freq: Three times a day (TID) | ORAL | 0 refills | Status: DC | PRN
  Filled 2023-02-15: qty 15, 5d supply, fill #0

## 2023-02-16 ENCOUNTER — Other Ambulatory Visit (HOSPITAL_BASED_OUTPATIENT_CLINIC_OR_DEPARTMENT_OTHER): Payer: Self-pay

## 2023-02-28 ENCOUNTER — Other Ambulatory Visit (HOSPITAL_BASED_OUTPATIENT_CLINIC_OR_DEPARTMENT_OTHER): Payer: Self-pay

## 2023-02-28 MED ORDER — LORAZEPAM 0.5 MG PO TABS
0.5000 mg | ORAL_TABLET | Freq: Three times a day (TID) | ORAL | 0 refills | Status: DC | PRN
  Filled 2023-02-28: qty 15, 5d supply, fill #0

## 2023-03-10 ENCOUNTER — Other Ambulatory Visit (HOSPITAL_BASED_OUTPATIENT_CLINIC_OR_DEPARTMENT_OTHER): Payer: Self-pay

## 2023-03-21 ENCOUNTER — Other Ambulatory Visit: Payer: Self-pay

## 2023-03-26 DEATH — deceased

## 2023-03-28 ENCOUNTER — Telehealth: Payer: Self-pay

## 2023-03-28 NOTE — Telephone Encounter (Signed)
Caller Name:  [facility] Phone #:   [phone]  Date of death: 01-Apr-2023 Place of death: Hospice  Time of death: 11:40 pm

## 2023-04-05 ENCOUNTER — Encounter: Payer: Medicare Other | Admitting: Medical
